# Patient Record
Sex: Female | Born: 1985 | Race: Black or African American | Hispanic: No | Marital: Single | Smoking: Never smoker
Health system: Southern US, Community
[De-identification: ages and names within clinical notes are randomized; demographics above are authoritative.]

## PROBLEM LIST (undated history)

## (undated) ENCOUNTER — Inpatient Hospital Stay (HOSPITAL_COMMUNITY): Payer: Self-pay

## (undated) DIAGNOSIS — G35 Multiple sclerosis: Secondary | ICD-10-CM

## (undated) DIAGNOSIS — H539 Unspecified visual disturbance: Secondary | ICD-10-CM

## (undated) DIAGNOSIS — R87619 Unspecified abnormal cytological findings in specimens from cervix uteri: Secondary | ICD-10-CM

## (undated) DIAGNOSIS — B009 Herpesviral infection, unspecified: Secondary | ICD-10-CM

## (undated) DIAGNOSIS — IMO0002 Reserved for concepts with insufficient information to code with codable children: Secondary | ICD-10-CM

## (undated) DIAGNOSIS — Z8619 Personal history of other infectious and parasitic diseases: Secondary | ICD-10-CM

## (undated) DIAGNOSIS — I10 Essential (primary) hypertension: Secondary | ICD-10-CM

## (undated) HISTORY — PX: WISDOM TOOTH EXTRACTION: SHX21

## (undated) HISTORY — PX: COLPOSCOPY: SHX161

## (undated) HISTORY — DX: Unspecified visual disturbance: H53.9

## (undated) HISTORY — DX: Personal history of other infectious and parasitic diseases: Z86.19

## (undated) HISTORY — DX: Multiple sclerosis: G35

## (undated) HISTORY — DX: Essential (primary) hypertension: I10

---

## 2004-02-04 ENCOUNTER — Emergency Department (HOSPITAL_COMMUNITY): Admission: EM | Admit: 2004-02-04 | Discharge: 2004-02-04 | Payer: Self-pay

## 2005-01-06 ENCOUNTER — Emergency Department (HOSPITAL_COMMUNITY): Admission: EM | Admit: 2005-01-06 | Discharge: 2005-01-06 | Payer: Self-pay | Admitting: Emergency Medicine

## 2008-03-16 ENCOUNTER — Emergency Department (HOSPITAL_COMMUNITY): Admission: EM | Admit: 2008-03-16 | Discharge: 2008-03-16 | Payer: Self-pay | Admitting: Family Medicine

## 2008-10-13 ENCOUNTER — Ambulatory Visit: Payer: Self-pay | Admitting: Advanced Practice Midwife

## 2008-10-13 ENCOUNTER — Inpatient Hospital Stay (HOSPITAL_COMMUNITY): Admission: AD | Admit: 2008-10-13 | Discharge: 2008-10-14 | Payer: Self-pay | Admitting: Obstetrics and Gynecology

## 2008-11-28 ENCOUNTER — Inpatient Hospital Stay (HOSPITAL_COMMUNITY): Admission: AD | Admit: 2008-11-28 | Discharge: 2008-11-28 | Payer: Self-pay | Admitting: Obstetrics and Gynecology

## 2008-12-28 ENCOUNTER — Inpatient Hospital Stay (HOSPITAL_COMMUNITY): Admission: AD | Admit: 2008-12-28 | Discharge: 2008-12-29 | Payer: Self-pay | Admitting: Obstetrics and Gynecology

## 2008-12-31 ENCOUNTER — Inpatient Hospital Stay (HOSPITAL_COMMUNITY): Admission: AD | Admit: 2008-12-31 | Discharge: 2008-12-31 | Payer: Self-pay | Admitting: Obstetrics and Gynecology

## 2009-01-05 ENCOUNTER — Inpatient Hospital Stay (HOSPITAL_COMMUNITY): Admission: AD | Admit: 2009-01-05 | Discharge: 2009-01-05 | Payer: Self-pay | Admitting: Obstetrics and Gynecology

## 2009-01-16 ENCOUNTER — Inpatient Hospital Stay (HOSPITAL_COMMUNITY): Admission: AD | Admit: 2009-01-16 | Discharge: 2009-01-17 | Payer: Self-pay | Admitting: Obstetrics and Gynecology

## 2009-05-18 ENCOUNTER — Other Ambulatory Visit: Admission: RE | Admit: 2009-05-18 | Discharge: 2009-05-18 | Payer: Self-pay | Admitting: Obstetrics and Gynecology

## 2009-06-10 ENCOUNTER — Emergency Department (HOSPITAL_COMMUNITY): Admission: EM | Admit: 2009-06-10 | Discharge: 2009-06-10 | Payer: Self-pay | Admitting: Family Medicine

## 2010-08-13 ENCOUNTER — Other Ambulatory Visit: Payer: Self-pay | Admitting: Obstetrics and Gynecology

## 2010-08-13 ENCOUNTER — Other Ambulatory Visit (HOSPITAL_COMMUNITY)
Admission: RE | Admit: 2010-08-13 | Discharge: 2010-08-13 | Disposition: A | Discharge status: Home / Self Care | Payer: BC Managed Care – PPO | Source: Ambulatory Visit | Attending: Obstetrics and Gynecology | Admitting: Obstetrics and Gynecology

## 2010-08-13 DIAGNOSIS — Z113 Encounter for screening for infections with a predominantly sexual mode of transmission: Secondary | ICD-10-CM | POA: Insufficient documentation

## 2010-08-13 DIAGNOSIS — Z01419 Encounter for gynecological examination (general) (routine) without abnormal findings: Secondary | ICD-10-CM | POA: Insufficient documentation

## 2010-08-25 LAB — CBC
HCT: 33.1 % — ABNORMAL LOW (ref 36.0–46.0)
MCHC: 33.1 g/dL (ref 30.0–36.0)
MCHC: 33.1 g/dL (ref 30.0–36.0)
Platelets: 155 10*3/uL (ref 150–400)
Platelets: 167 10*3/uL (ref 150–400)
RBC: 3.92 MIL/uL (ref 3.87–5.11)
RDW: 14.6 % (ref 11.5–15.5)

## 2010-08-26 LAB — URINALYSIS, ROUTINE W REFLEX MICROSCOPIC
Glucose, UA: NEGATIVE mg/dL
Leukocytes, UA: NEGATIVE
Nitrite: NEGATIVE
Protein, ur: NEGATIVE mg/dL
Urobilinogen, UA: 1 mg/dL (ref 0.0–1.0)

## 2010-08-26 LAB — URINE MICROSCOPIC-ADD ON

## 2010-08-28 LAB — URINALYSIS, ROUTINE W REFLEX MICROSCOPIC
Bilirubin Urine: NEGATIVE
Hgb urine dipstick: NEGATIVE
Protein, ur: 30 mg/dL — AB
Specific Gravity, Urine: >1.03 — ABNORMAL HIGH (ref 1.005–1.030)
Urobilinogen, UA: 1 mg/dL (ref 0.0–1.0)

## 2010-08-28 LAB — URINE MICROSCOPIC-ADD ON

## 2010-08-28 LAB — FETAL FIBRONECTIN: Fetal Fibronectin: NEGATIVE

## 2010-08-28 LAB — WET PREP, GENITAL: Clue Cells Wet Prep HPF POC: NONE SEEN

## 2011-02-19 ENCOUNTER — Ambulatory Visit (INDEPENDENT_AMBULATORY_CARE_PROVIDER_SITE_OTHER): Payer: BC Managed Care – PPO | Admitting: Urology

## 2011-02-19 DIAGNOSIS — N3941 Urge incontinence: Secondary | ICD-10-CM

## 2011-02-19 DIAGNOSIS — R3915 Urgency of urination: Secondary | ICD-10-CM

## 2011-02-19 DIAGNOSIS — R350 Frequency of micturition: Secondary | ICD-10-CM

## 2011-04-02 ENCOUNTER — Other Ambulatory Visit: Payer: Self-pay | Admitting: Obstetrics and Gynecology

## 2011-04-02 DIAGNOSIS — N644 Mastodynia: Secondary | ICD-10-CM

## 2011-04-02 DIAGNOSIS — N6322 Unspecified lump in the left breast, upper inner quadrant: Secondary | ICD-10-CM

## 2011-04-04 ENCOUNTER — Ambulatory Visit
Admission: RE | Admit: 2011-04-04 | Discharge: 2011-04-04 | Disposition: A | Discharge status: Home / Self Care | Payer: BC Managed Care – PPO | Source: Ambulatory Visit | Attending: Obstetrics and Gynecology | Admitting: Obstetrics and Gynecology

## 2011-04-04 DIAGNOSIS — N6322 Unspecified lump in the left breast, upper inner quadrant: Secondary | ICD-10-CM

## 2011-04-04 DIAGNOSIS — N644 Mastodynia: Secondary | ICD-10-CM

## 2011-07-30 ENCOUNTER — Other Ambulatory Visit (HOSPITAL_COMMUNITY)
Admission: RE | Admit: 2011-07-30 | Discharge: 2011-07-30 | Disposition: A | Discharge status: Home / Self Care | Payer: BC Managed Care – PPO | Source: Ambulatory Visit | Attending: Obstetrics and Gynecology | Admitting: Obstetrics and Gynecology

## 2011-07-30 ENCOUNTER — Other Ambulatory Visit: Payer: Self-pay | Admitting: Obstetrics and Gynecology

## 2011-07-30 DIAGNOSIS — R87810 Cervical high risk human papillomavirus (HPV) DNA test positive: Secondary | ICD-10-CM | POA: Insufficient documentation

## 2011-07-30 DIAGNOSIS — Z01419 Encounter for gynecological examination (general) (routine) without abnormal findings: Secondary | ICD-10-CM | POA: Insufficient documentation

## 2011-09-02 ENCOUNTER — Other Ambulatory Visit: Payer: Self-pay | Admitting: Obstetrics and Gynecology

## 2012-03-03 ENCOUNTER — Other Ambulatory Visit: Payer: Self-pay | Admitting: Obstetrics and Gynecology

## 2012-03-03 ENCOUNTER — Other Ambulatory Visit (HOSPITAL_COMMUNITY)
Admission: RE | Admit: 2012-03-03 | Discharge: 2012-03-03 | Disposition: A | Discharge status: Home / Self Care | Payer: Managed Care, Other (non HMO) | Source: Ambulatory Visit | Attending: Obstetrics and Gynecology | Admitting: Obstetrics and Gynecology

## 2012-03-03 DIAGNOSIS — Z113 Encounter for screening for infections with a predominantly sexual mode of transmission: Secondary | ICD-10-CM | POA: Insufficient documentation

## 2012-03-03 DIAGNOSIS — Z1151 Encounter for screening for human papillomavirus (HPV): Secondary | ICD-10-CM | POA: Insufficient documentation

## 2012-03-03 DIAGNOSIS — Z01419 Encounter for gynecological examination (general) (routine) without abnormal findings: Secondary | ICD-10-CM | POA: Insufficient documentation

## 2012-05-20 NOTE — L&D Delivery Note (Signed)
Delivery Note At 3:04 PM a viable female was delivered via Vaginal, Spontaneous Delivery (Presentation: occoput anterior  ;  ).  APGAR: 9, 9; weight .   Placenta status: Intact, Spontaneous.  Cord: 3 vessels with the following complications: None.  Cord pH: na  Anesthesia: Epidural  Episiotomy: None Lacerations: None Suture Repair: na Est. Blood Loss (mL): 300  Mom to postpartum.  Baby to Couplet care / Skin to Skin.  Tierrah Anastos J. 04/01/2013, 3:50 PM

## 2012-09-11 LAB — OB RESULTS CONSOLE HEPATITIS B SURFACE ANTIGEN: Hepatitis B Surface Ag: NEGATIVE

## 2012-09-11 LAB — OB RESULTS CONSOLE HIV ANTIBODY (ROUTINE TESTING): HIV: NONREACTIVE

## 2012-09-11 LAB — OB RESULTS CONSOLE ABO/RH: RH Type: POSITIVE

## 2012-09-17 ENCOUNTER — Other Ambulatory Visit (HOSPITAL_COMMUNITY)
Admission: RE | Admit: 2012-09-17 | Discharge: 2012-09-17 | Disposition: A | Discharge status: Home / Self Care | Payer: 59 | Source: Ambulatory Visit | Attending: Obstetrics and Gynecology | Admitting: Obstetrics and Gynecology

## 2012-09-17 ENCOUNTER — Other Ambulatory Visit: Payer: Self-pay | Admitting: Obstetrics and Gynecology

## 2012-09-17 DIAGNOSIS — Z01419 Encounter for gynecological examination (general) (routine) without abnormal findings: Secondary | ICD-10-CM | POA: Insufficient documentation

## 2012-09-17 DIAGNOSIS — Z113 Encounter for screening for infections with a predominantly sexual mode of transmission: Secondary | ICD-10-CM | POA: Insufficient documentation

## 2012-09-17 DIAGNOSIS — R87810 Cervical high risk human papillomavirus (HPV) DNA test positive: Secondary | ICD-10-CM | POA: Insufficient documentation

## 2012-09-17 DIAGNOSIS — Z1151 Encounter for screening for human papillomavirus (HPV): Secondary | ICD-10-CM | POA: Insufficient documentation

## 2013-01-20 LAB — OB RESULTS CONSOLE GC/CHLAMYDIA: Chlamydia: NEGATIVE

## 2013-03-15 ENCOUNTER — Encounter (HOSPITAL_COMMUNITY): Payer: Self-pay | Admitting: *Deleted

## 2013-03-15 ENCOUNTER — Inpatient Hospital Stay (HOSPITAL_COMMUNITY)
Admission: AD | Admit: 2013-03-15 | Discharge: 2013-03-15 | Disposition: A | Discharge status: Home / Self Care | Payer: 59 | Source: Ambulatory Visit | Attending: Obstetrics and Gynecology | Admitting: Obstetrics and Gynecology

## 2013-03-15 DIAGNOSIS — O479 False labor, unspecified: Secondary | ICD-10-CM | POA: Insufficient documentation

## 2013-03-15 HISTORY — DX: Unspecified abnormal cytological findings in specimens from cervix uteri: R87.619

## 2013-03-15 HISTORY — DX: Herpesviral infection, unspecified: B00.9

## 2013-03-15 HISTORY — DX: Reserved for concepts with insufficient information to code with codable children: IMO0002

## 2013-03-15 MED ORDER — OXYCODONE-ACETAMINOPHEN 5-325 MG PO TABS
2.0000 | ORAL_TABLET | Freq: Once | ORAL | Status: AC
  Administered 2013-03-15: 2 via ORAL
  Filled 2013-03-15: qty 2

## 2013-03-15 MED ORDER — LACTATED RINGERS IV SOLN
INTRAVENOUS | Status: DC
Start: 2013-03-15 — End: 2013-03-15

## 2013-03-15 MED ORDER — LACTATED RINGERS IV SOLN
Freq: Once | INTRAVENOUS | Status: AC
  Administered 2013-03-15: 10:00:00 via INTRAVENOUS

## 2013-03-15 NOTE — MAU Note (Signed)
UC's since 0530. States was 2cm on last exam. No leaking or bleeding.

## 2013-03-15 NOTE — MAU Note (Signed)
Patient states she is having contractions every 5 minutes. Denies bleeding or leaking and reports good fetal movement.  

## 2013-03-24 ENCOUNTER — Encounter (HOSPITAL_COMMUNITY): Payer: Self-pay | Admitting: *Deleted

## 2013-03-24 ENCOUNTER — Inpatient Hospital Stay (HOSPITAL_COMMUNITY)
Admission: AD | Admit: 2013-03-24 | Discharge: 2013-03-24 | Disposition: A | Discharge status: Home / Self Care | Payer: 59 | Source: Ambulatory Visit | Attending: Obstetrics and Gynecology | Admitting: Obstetrics and Gynecology

## 2013-03-24 DIAGNOSIS — O479 False labor, unspecified: Secondary | ICD-10-CM | POA: Insufficient documentation

## 2013-03-24 NOTE — MAU Note (Signed)
Has been contracting, but around noon today, they seemed to get stronger, maybe closer.  Has a lot of pressure. No bleeding, no leaking.  Was 2/75 yesterday in office.

## 2013-03-24 NOTE — Discharge Instructions (Signed)
Keep your scheduled appointment for prenatal care. Call the office or provider on call with further concerns or return to MAU as needed. °

## 2013-03-28 ENCOUNTER — Encounter (HOSPITAL_COMMUNITY): Payer: Self-pay | Admitting: *Deleted

## 2013-03-28 ENCOUNTER — Inpatient Hospital Stay (HOSPITAL_COMMUNITY)
Admission: AD | Admit: 2013-03-28 | Discharge: 2013-03-28 | Disposition: A | Discharge status: Home / Self Care | Payer: 59 | Source: Ambulatory Visit | Attending: Obstetrics and Gynecology | Admitting: Obstetrics and Gynecology

## 2013-03-28 DIAGNOSIS — O479 False labor, unspecified: Secondary | ICD-10-CM | POA: Insufficient documentation

## 2013-03-28 MED ORDER — ACETAMINOPHEN 325 MG PO TABS
650.0000 mg | ORAL_TABLET | Freq: Once | ORAL | Status: AC
  Administered 2013-03-28: 650 mg via ORAL
  Filled 2013-03-28: qty 2

## 2013-03-28 NOTE — MAU Note (Signed)
[facility] did not engage as asked on arrival; request resubmitted and tracing.

## 2013-03-28 NOTE — MAU Note (Signed)
Patient presents with complaint of regular contractions every 5 minutes since 0900 today.

## 2013-03-28 NOTE — MAU Note (Signed)
Asked to call Ambien 10mg  1 po qhs prn x 5 tablets to pharmacy of choice by Dr. Richardson Dopp.

## 2013-03-29 ENCOUNTER — Encounter (HOSPITAL_COMMUNITY): Payer: Self-pay | Admitting: *Deleted

## 2013-03-29 ENCOUNTER — Telehealth (HOSPITAL_COMMUNITY): Payer: Self-pay | Admitting: *Deleted

## 2013-03-29 NOTE — Telephone Encounter (Signed)
Preadmission screen  

## 2013-04-01 ENCOUNTER — Inpatient Hospital Stay (HOSPITAL_COMMUNITY)
Admission: RE | Admit: 2013-04-01 | Discharge: 2013-04-03 | DRG: 774 | Disposition: A | Discharge status: Home / Self Care | Payer: 59 | Source: Ambulatory Visit | Attending: Obstetrics and Gynecology | Admitting: Obstetrics and Gynecology

## 2013-04-01 ENCOUNTER — Inpatient Hospital Stay (HOSPITAL_COMMUNITY): Payer: 59 | Admitting: Anesthesiology

## 2013-04-01 ENCOUNTER — Encounter (HOSPITAL_COMMUNITY): Payer: 59 | Admitting: Anesthesiology

## 2013-04-01 ENCOUNTER — Encounter (HOSPITAL_COMMUNITY): Payer: Self-pay

## 2013-04-01 DIAGNOSIS — O2650 Maternal hypotension syndrome, unspecified trimester: Secondary | ICD-10-CM | POA: Diagnosis not present

## 2013-04-01 LAB — CBC
HCT: 27.8 % — ABNORMAL LOW (ref 36.0–46.0)
Hemoglobin: 9.4 g/dL — ABNORMAL LOW (ref 12.0–15.0)
MCH: 25.9 pg — ABNORMAL LOW (ref 26.0–34.0)
MCH: 26 pg (ref 26.0–34.0)
MCHC: 33.6 g/dL (ref 30.0–36.0)
MCHC: 33.8 g/dL (ref 30.0–36.0)
MCV: 76.9 fL — ABNORMAL LOW (ref 78.0–100.0)
Platelets: 186 10*3/uL (ref 150–400)
Platelets: 163 10*3/uL (ref 150–400)
RBC: 4.25 MIL/uL (ref 3.87–5.11)
RBC: 3.62 MIL/uL — ABNORMAL LOW (ref 3.87–5.11)
RDW: 14.5 % (ref 11.5–15.5)
RDW: 14 % (ref 11.5–15.5)
WBC: 17.7 10*3/uL — ABNORMAL HIGH (ref 4.0–10.5)

## 2013-04-01 LAB — POSTPARTUM HEMORRHAGE PROTOCOL (BB NOTIFICATION)

## 2013-04-01 LAB — RPR: RPR Ser Ql: NONREACTIVE

## 2013-04-01 LAB — PREPARE RBC (CROSSMATCH)

## 2013-04-01 MED ORDER — ONDANSETRON HCL 4 MG/2ML IJ SOLN: 4.0000 mg | Freq: Four times a day (QID) | INTRAMUSCULAR | Status: DC | PRN

## 2013-04-01 MED ORDER — OXYTOCIN BOLUS FROM INFUSION
500.0000 mL | INTRAVENOUS | Status: DC
  Administered 2013-04-01 (×2): 500 mL via INTRAVENOUS

## 2013-04-01 MED ORDER — TERBUTALINE SULFATE 1 MG/ML IJ SOLN: 0.2500 mg | Freq: Once | INTRAMUSCULAR | Status: DC | PRN

## 2013-04-01 MED ORDER — ONDANSETRON HCL 4 MG PO TABS: 4.0000 mg | ORAL_TABLET | ORAL | Status: DC | PRN

## 2013-04-01 MED ORDER — FLEET ENEMA 7-19 GM/118ML RE ENEM: 1.0000 | ENEMA | RECTAL | Status: DC | PRN

## 2013-04-01 MED ORDER — IBUPROFEN 600 MG PO TABS
600.0000 mg | ORAL_TABLET | Freq: Four times a day (QID) | ORAL | Status: DC | PRN
  Administered 2013-04-01: 600 mg via ORAL
  Filled 2013-04-01: qty 1

## 2013-04-01 MED ORDER — SENNOSIDES-DOCUSATE SODIUM 8.6-50 MG PO TABS
2.0000 | ORAL_TABLET | ORAL | Status: DC
  Administered 2013-04-01 – 2013-04-02 (×2): 2 via ORAL
  Filled 2013-04-01 (×2): qty 2

## 2013-04-01 MED ORDER — DIBUCAINE 1 % RE OINT: 1.0000 "application " | TOPICAL_OINTMENT | RECTAL | Status: DC | PRN

## 2013-04-01 MED ORDER — METHYLERGONOVINE MALEATE 0.2 MG/ML IJ SOLN
0.2000 mg | Freq: Once | INTRAMUSCULAR | Status: AC
  Administered 2013-04-01: 0.2 mg via INTRAMUSCULAR

## 2013-04-01 MED ORDER — LANOLIN HYDROUS EX OINT: TOPICAL_OINTMENT | CUTANEOUS | Status: DC | PRN

## 2013-04-01 MED ORDER — METHYLERGONOVINE MALEATE 0.2 MG/ML IJ SOLN: 0.2000 mg | INTRAMUSCULAR | Status: DC | PRN

## 2013-04-01 MED ORDER — OXYCODONE-ACETAMINOPHEN 5-325 MG PO TABS: 1.0000 | ORAL_TABLET | ORAL | Status: DC | PRN

## 2013-04-01 MED ORDER — OXYCODONE-ACETAMINOPHEN 5-325 MG PO TABS
1.0000 | ORAL_TABLET | ORAL | Status: DC | PRN
  Administered 2013-04-01 – 2013-04-02 (×3): 1 via ORAL
  Administered 2013-04-02: 2 via ORAL
  Administered 2013-04-02: 1 via ORAL
  Administered 2013-04-02: 2 via ORAL
  Administered 2013-04-03 (×3): 1 via ORAL
  Filled 2013-04-01 (×7): qty 1
  Filled 2013-04-01: qty 2
  Filled 2013-04-01 (×2): qty 1

## 2013-04-01 MED ORDER — DIPHENHYDRAMINE HCL 50 MG/ML IJ SOLN: 12.5000 mg | INTRAMUSCULAR | Status: DC | PRN

## 2013-04-01 MED ORDER — EPHEDRINE 5 MG/ML INJ
10.0000 mg | INTRAVENOUS | Status: DC | PRN
  Filled 2013-04-01: qty 2

## 2013-04-01 MED ORDER — OXYTOCIN 40 UNITS IN LACTATED RINGERS INFUSION - SIMPLE MED
1.0000 m[IU]/min | INTRAVENOUS | Status: DC
  Administered 2013-04-01: 2 m[IU]/min via INTRAVENOUS
  Filled 2013-04-01: qty 1000

## 2013-04-01 MED ORDER — FERROUS SULFATE 325 (65 FE) MG PO TABS
325.0000 mg | ORAL_TABLET | Freq: Two times a day (BID) | ORAL | Status: DC
  Administered 2013-04-02 – 2013-04-03 (×4): 325 mg via ORAL
  Filled 2013-04-01 (×4): qty 1

## 2013-04-01 MED ORDER — LIDOCAINE HCL (PF) 1 % IJ SOLN
30.0000 mL | INTRAMUSCULAR | Status: DC | PRN
  Filled 2013-04-01 (×2): qty 30

## 2013-04-01 MED ORDER — CITRIC ACID-SODIUM CITRATE 334-500 MG/5ML PO SOLN: 30.0000 mL | ORAL | Status: DC | PRN

## 2013-04-01 MED ORDER — WITCH HAZEL-GLYCERIN EX PADS: 1.0000 "application " | MEDICATED_PAD | CUTANEOUS | Status: DC | PRN

## 2013-04-01 MED ORDER — FENTANYL 2.5 MCG/ML BUPIVACAINE 1/10 % EPIDURAL INFUSION (WH - ANES)
14.0000 mL/h | INTRAMUSCULAR | Status: DC | PRN
  Administered 2013-04-01: 14 mL/h via EPIDURAL
  Filled 2013-04-01: qty 125

## 2013-04-01 MED ORDER — METHYLERGONOVINE MALEATE 0.2 MG PO TABS
0.2000 mg | ORAL_TABLET | ORAL | Status: DC | PRN
  Administered 2013-04-01 – 2013-04-02 (×2): 0.2 mg via ORAL
  Filled 2013-04-01 (×2): qty 1

## 2013-04-01 MED ORDER — IBUPROFEN 600 MG PO TABS
600.0000 mg | ORAL_TABLET | Freq: Four times a day (QID) | ORAL | Status: DC
  Administered 2013-04-01 – 2013-04-03 (×8): 600 mg via ORAL
  Filled 2013-04-01 (×9): qty 1

## 2013-04-01 MED ORDER — ZOLPIDEM TARTRATE 5 MG PO TABS: 5.0000 mg | ORAL_TABLET | Freq: Every evening | ORAL | Status: DC | PRN

## 2013-04-01 MED ORDER — PHENYLEPHRINE 40 MCG/ML (10ML) SYRINGE FOR IV PUSH (FOR BLOOD PRESSURE SUPPORT)
80.0000 ug | PREFILLED_SYRINGE | INTRAVENOUS | Status: DC | PRN
  Filled 2013-04-01: qty 2

## 2013-04-01 MED ORDER — ONDANSETRON HCL 4 MG/2ML IJ SOLN: 4.0000 mg | INTRAMUSCULAR | Status: DC | PRN

## 2013-04-01 MED ORDER — EPHEDRINE 5 MG/ML INJ
10.0000 mg | INTRAVENOUS | Status: DC | PRN
  Filled 2013-04-01: qty 2
  Filled 2013-04-01: qty 4

## 2013-04-01 MED ORDER — OXYTOCIN 40 UNITS IN LACTATED RINGERS INFUSION - SIMPLE MED
INTRAVENOUS | Status: AC
  Filled 2013-04-01: qty 1000

## 2013-04-01 MED ORDER — PRENATAL MULTIVITAMIN CH
1.0000 | ORAL_TABLET | Freq: Every day | ORAL | Status: DC
  Administered 2013-04-02 – 2013-04-03 (×2): 1 via ORAL
  Filled 2013-04-01 (×2): qty 1

## 2013-04-01 MED ORDER — ACETAMINOPHEN 325 MG PO TABS: 650.0000 mg | ORAL_TABLET | ORAL | Status: DC | PRN

## 2013-04-01 MED ORDER — LACTATED RINGERS IV SOLN
500.0000 mL | INTRAVENOUS | Status: DC | PRN
  Administered 2013-04-01: 1000 mL via INTRAVENOUS

## 2013-04-01 MED ORDER — DIPHENHYDRAMINE HCL 25 MG PO CAPS: 25.0000 mg | ORAL_CAPSULE | Freq: Four times a day (QID) | ORAL | Status: DC | PRN

## 2013-04-01 MED ORDER — SIMETHICONE 80 MG PO CHEW: 80.0000 mg | CHEWABLE_TABLET | ORAL | Status: DC | PRN

## 2013-04-01 MED ORDER — SODIUM BICARBONATE 8.4 % IV SOLN
INTRAVENOUS | Status: DC | PRN
  Administered 2013-04-01: 5 mL via EPIDURAL

## 2013-04-01 MED ORDER — HYDROMORPHONE HCL PF 1 MG/ML IJ SOLN
INTRAMUSCULAR | Status: AC
  Administered 2013-04-01: 1 mg via INTRAMUSCULAR
  Filled 2013-04-01: qty 1

## 2013-04-01 MED ORDER — OXYTOCIN 40 UNITS IN LACTATED RINGERS INFUSION - SIMPLE MED: 62.5000 mL/h | INTRAVENOUS | Status: DC

## 2013-04-01 MED ORDER — LACTATED RINGERS IV SOLN: 500.0000 mL | Freq: Once | INTRAVENOUS | Status: DC

## 2013-04-01 MED ORDER — LACTATED RINGERS IV SOLN: INTRAVENOUS | Status: DC

## 2013-04-01 MED ORDER — MISOPROSTOL 200 MCG PO TABS
1000.0000 ug | ORAL_TABLET | Freq: Once | ORAL | Status: AC
  Administered 2013-04-01: 1000 ug via RECTAL

## 2013-04-01 MED ORDER — MISOPROSTOL 200 MCG PO TABS
ORAL_TABLET | ORAL | Status: AC
  Administered 2013-04-01: 1000 ug via RECTAL
  Filled 2013-04-01: qty 5

## 2013-04-01 MED ORDER — LACTATED RINGERS IV BOLUS (SEPSIS)
500.0000 mL | Freq: Once | INTRAVENOUS | Status: AC
  Administered 2013-04-01: 50 mL via INTRAVENOUS

## 2013-04-01 MED ORDER — BENZOCAINE-MENTHOL 20-0.5 % EX AERO: 1.0000 "application " | INHALATION_SPRAY | CUTANEOUS | Status: DC | PRN

## 2013-04-01 MED ORDER — PHENYLEPHRINE 40 MCG/ML (10ML) SYRINGE FOR IV PUSH (FOR BLOOD PRESSURE SUPPORT)
80.0000 ug | PREFILLED_SYRINGE | INTRAVENOUS | Status: DC | PRN
  Filled 2013-04-01: qty 10
  Filled 2013-04-01: qty 2

## 2013-04-01 NOTE — Progress Notes (Signed)
Post Partum Day 0 s/p svd  Subjective: Called by nurse to see patient due to syncopal episode and postpartum hemorrhage. According to the nurse patient had of blood loss prior to my arrival Per my instructions she received  methergine 0.2 mg IM and 1000 mcg of cytotec per rectum..  Objective: Blood pressure 134/84, pulse 84, temperature 98.7 F (37.1 C), temperature source Oral, resp. rate 22, height 5\' 4"  (1.626 m), weight 77.111 kg (170 lb), SpO2 100.00%, unknown if currently breastfeeding.  Physical Exam:  General: cooperative, fatigued and flushed Lochia: with fundal massage of blood was expressed from the uterus...  Pelvic exam no vaginal or cervical lacerations were present.  Uterine Fundus: soft Incision: NA DVT Evaluation: No evidence of DVT seen on physical exam.   Recent Labs  04/01/13 0800 04/01/13 1842  HGB 11.0* 9.4*  HCT 32.7* 27.8*    Assessment/Plan: PPD# 0 with postpartum hemorrhage CUrrently stable   Bleeding subsided with pitocin and fundal message.   Continue methergine 2 mg q 4 hours  6 doses Second iv site placed  Pt advised if hemorrhage restarts may require D&C possible endometrial balloon placement.  R/b/a of D&C discussed including but not limited to infection/ bleeding/ perforation of uterus.. Continued bleeding with possible need for hysterectomy.  R/o transfusion hiv/ hep B&C discussed.  Pt voiced understanding   No discharge until 04/03/2013 due to postpartum hemorrhage.  Dr. Stefano Gaul covering 04/01/2013 throug 04/02/2013 until 7 am Dr. Charlotta Newton covering 04/02/2013 starting at 7 am   LOS: 0 days   Zimir Kittleson J. 04/01/2013, 7:11 PM

## 2013-04-01 NOTE — Progress Notes (Signed)
Rapid Response called, patient passed out and had some minor jerking movement per RN. Back in bed at time Rapid Response RN arrived to room. On non-rebreather. Patient with continued vaginal bleeding, some clots expressed with fundal massage, PPH stage 1 initiated, Dr.Cole notified. Orders entered and Methergine and Cytotec given as well as fluid bolus IV. Dr.Cole arrived, more clots expressed and speculum exam done, Pitocin bolus started and additional IV started and labs drawn. Patient given Dilaudid for pain. RROB RN  And patient's primary RN at bedside. Patient stable. Family at bedside.

## 2013-04-01 NOTE — Anesthesia Preprocedure Evaluation (Signed)
Anesthesia Evaluation  Patient identified by MRN, date of birth, ID band Patient awake    Reviewed: Allergy & Precautions, H&P , Patient's Chart, lab work & pertinent test results  Airway Mallampati: II TM Distance: >3 FB Neck ROM: Full    Dental no notable dental hx. (+) Teeth Intact   Pulmonary neg pulmonary ROS,  breath sounds clear to auscultation  Pulmonary exam normal       Cardiovascular negative cardio ROS  Rhythm:Regular Rate:Normal     Neuro/Psych negative neurological ROS  negative psych ROS   GI/Hepatic negative GI ROS, Neg liver ROS,   Endo/Other  negative endocrine ROS  Renal/GU negative Renal ROS  negative genitourinary   Musculoskeletal negative musculoskeletal ROS (+)   Abdominal (+) - obese,   Peds  Hematology  (+) anemia ,   Anesthesia Other Findings   Reproductive/Obstetrics (+) Pregnancy HSV                           Anesthesia Physical Anesthesia Plan  ASA: II  Anesthesia Plan: Epidural   Post-op Pain Management:    Induction:   Airway Management Planned: Natural Airway  Additional Equipment:   Intra-op Plan:   Post-operative Plan:   Informed Consent: I have reviewed the patients History and Physical, chart, labs and discussed the procedure including the risks, benefits and alternatives for the proposed anesthesia with the patient or authorized representative who has indicated his/her understanding and acceptance.     Plan Discussed with: Anesthesiologist  Anesthesia Plan Comments:         Anesthesia Quick Evaluation

## 2013-04-01 NOTE — H&P (Signed)
Amanda Horne is a 27 y.o. female presenting for for elective induction at 39 wks and 2 days. +fm no lof no vaginal bleeding irregular contractions.     History OB History   Grav Para Term Preterm Abortions TAB SAB Ect Mult Living   3 2 2       2      Past Medical History  Diagnosis Date  . Abnormal Pap smear   . Herpes   . Hx of varicella    Past Surgical History  Procedure Laterality Date  . Colposcopy    . Wisdom tooth extraction    . No past surgeries     Family History: family history includes Diabetes in her mother and sister; Hypertension in her mother; Other in her paternal uncle. Social History:  reports that she has never smoked. She has never used smokeless tobacco. She reports that she does not drink alcohol or use illicit drugs.   Prenatal Transfer Tool  Maternal Diabetes: No Genetic Screening: Normal Maternal Ultrasounds/Referrals: Normal Fetal Ultrasounds or other Referrals:  None Maternal Substance Abuse:  No Significant Maternal Medications:  Meds include: Other: valtrex  Significant Maternal Lab Results:  None Other Comments:  None  Review of Systems  All other systems reviewed and are negative.    Dilation: 4.5 Effacement (%): 70 Station: -1 Exam by:: Selassie Spatafore Blood pressure 118/72, pulse 86, temperature 98.5 F (36.9 C), temperature source Axillary, resp. rate 18, height 5\' 4"  (1.626 m), weight 77.111 kg (170 lb), SpO2 100.00%. Maternal Exam:  Uterine Assessment: Contraction strength is moderate.  Abdomen: Patient reports no abdominal tenderness. Introitus: Normal vulva. Normal vagina.    Fetal Exam Fetal Monitor Review: Baseline rate: 120.  Variability: moderate (6-25 bpm).    Fetal State Assessment: Category I - tracings are normal.     Physical Exam  Constitutional: She is oriented to person, place, and time. She appears well-developed and well-nourished.  Neck: Normal range of motion.  Cardiovascular: Normal rate and regular rhythm.    Respiratory: Effort normal.  Genitourinary: Vagina normal and uterus normal.  Musculoskeletal: Normal range of motion. She exhibits no edema.  Neurological: She is alert and oriented to person, place, and time.  Skin: Skin is warm and dry.  Psychiatric: She has a normal mood and affect.   cervix 4.5/75/-1 Arom clear fluid  Prenatal labs: ABO, Rh: A/Positive/-- (04/25 0000) Antibody: Negative (04/25 0000) Rubella:  Immune  RPR: Nonreactive (04/25 0000)  HBsAg: Negative (04/25 0000)  HIV: Non-reactive (04/25 0000)  GBS: Negative (10/20 0000)   Assessment/Plan: 39 wks and 2 days for elective induction...  Pt has been informed of increased r/o cesarean section  Associated with induction and accepts this risk.  Continue pitocin   epidural for pain  Anticipate svd.   Ishmael Berkovich J. 04/01/2013, 1:20 PM

## 2013-04-01 NOTE — Anesthesia Procedure Notes (Signed)

## 2013-04-02 LAB — CBC
HCT: 21.2 % — ABNORMAL LOW (ref 36.0–46.0)
Hemoglobin: 7.2 g/dL — ABNORMAL LOW (ref 12.0–15.0)
MCH: 26 pg (ref 26.0–34.0)
MCV: 76.5 fL — ABNORMAL LOW (ref 78.0–100.0)
RBC: 2.77 MIL/uL — ABNORMAL LOW (ref 3.87–5.11)
WBC: 11.2 10*3/uL — ABNORMAL HIGH (ref 4.0–10.5)

## 2013-04-02 MED ORDER — HYDROMORPHONE HCL PF 1 MG/ML IJ SOLN
1.0000 mg | Freq: Once | INTRAMUSCULAR | Status: AC
  Administered 2013-04-01: 1 mg via INTRAMUSCULAR

## 2013-04-02 NOTE — Lactation Note (Signed)
This note was copied from the chart of Amanda Ron Sayas. Lactation Consultation Note   Follow up consult with this mom and baby, now 28 hours post partum. Mom hemorrhaged yesterday, but is feeling much better tonight. Her baby appears to be cluster feeding.I observed mom latching, and had mom sit back and bring the baby to her. He latches deeply with good suckles, but only for about 5 minutes at a time, Once unlatched, he begins cuing again. I showed mom how to hand express to supplement the baby. Mom was able to express about 0.5 mls of colostrum, which he spoon fed well. Mom does not want to begin pumping to supplement at the time, so I advised her to add hand expression about once an hour, with spoon feeding. I also told mom to continue with formula supplement, if baby is not having normal amounts of wet and dirty diapers, or continues to appear hungry despite breast feeding.Pecola Leisure and Me book breastfeeding pages reviewed with mom.  Mom knows to call for questions/concerns.  Patient Name: Amanda Horne [id number] Date: 04/02/2013 Reason for consult: Follow-up assessment   Maternal Data Has patient been taught Hand Expression?: No Does the patient have breastfeeding experience prior to this delivery?: Yes  Feeding Feeding Type: Breast Milk Length of feed: 10 min (5 on each breast )  LATCH Score/Interventions Latch: Grasps breast easily, tongue down, lips flanged, rhythmical sucking.  Audible Swallowing: A few with stimulation Intervention(s): Skin to skin;Hand expression  Type of Nipple: Everted at rest and after stimulation  Comfort (Breast/Nipple): Soft / non-tender     Hold (Positioning): Assistance needed to correctly position infant at breast and maintain latch. Intervention(s): Breastfeeding basics reviewed;Support Pillows;Position options;Skin to skin  LATCH Score: 8  Lactation Tools Discussed/Used     Consult Status Consult Status: Follow-up Date: 04/03/13 Follow-up  type: In-patient    Alfred Levins 04/02/2013, 7:28 PM

## 2013-04-02 NOTE — Anesthesia Postprocedure Evaluation (Signed)
  Anesthesia Post Note  Patient: Amanda Horne  Procedure(s) Performed: * No procedures listed *  Anesthesia type: Epidural  Patient location: Mother/Baby  Post pain: Pain level controlled  Post assessment: Post-op Vital signs reviewed  Last Vitals:  Filed Vitals:   04/02/13 0400  BP: 111/72  Pulse: 85  Temp: 36.8 C  Resp: 18    Post vital signs: Reviewed  Level of consciousness:alert  Complications: No apparent anesthesia complications

## 2013-04-02 NOTE — Significant Event (Signed)
Rapid Response Event Note  Overview:      Initial Focused Assessment:   Interventions:   Event Summary:  Follow up:  Called for update on RR call - spoke with RN caring for pt.  Repeat H/H 9.4 and 27.8 - will repeat in AM.  IV fluids LR with pitocin remain infusing.  Minimal vaginal bleeding with no further clots noted.  VSS.  Pt feeling much better, resting well.    at      at          [facility], Alden Benjamin

## 2013-04-02 NOTE — Progress Notes (Signed)
IV pit D/C and LR increased to 125 at 0400 on 04/02/13 per nigh shift RN Alinda Money Priddy

## 2013-04-02 NOTE — Progress Notes (Signed)
Postpartum Note Day # 1   S:  Patient resting comfortable in bed.  Pain controlled.  NPO secondary to PPH; feeling hungry this am. No flatus, no BM.  No ambulation. Pt has foley in place. She denies n/v/f/c, SOB, or CP.  No dizziness or headache this am.  Pt plans on breastfeeding.  O: BP 110/74  Pulse 86  Temp(Src) 98.3 F (36.8 C) (Oral)  Resp 20  Ht 5\' 4"  (1.626 m)  Wt 77.111 kg (170 lb)  BMI 29.17 kg/m2  SpO2 99% Tmax: 99  Gen: A&Ox3, NAD CV: RRR, no MRG Resp: CTAB Abdomen: soft, NT, ND Uterus: firm, non-tender, below umbilicus.  Lochia appropriate Ext: No edema, no calf tenderness bilaterally, SCDs in place  Labs:  Lab Results  Component Value Date   WBC 11.2* 04/02/2013   HGB 7.2* 04/02/2013   HCT 21.2* 04/02/2013   MCV 76.5* 04/02/2013   PLT 126* 04/02/2013    A/P: Pt is a 27 y.o. G3P3003 s/p NSVD complicated by PPH.  PPD #1 -PPH:  Total EBL 900cc  Heme: on admit: H/H 11/32.7, current as above.  Continue IV and po hydration today.  Pt asymptomatic, VSS, lochia appropriate.  Pt to ambulate this am, if any signs of dizziness or lightheadedness will consider transfusion.  Repeat CBC in am.  Iron bid - Pain well controlled with toradol, will change to PO pain management -GU: UOP is adequate, plan to remove foley this am -GI: Will advance to general diet this am.  Encourage colace bid -Activity: encouraged sitting up to chair and ambulation as tolerated today -Prophylaxis: SCDs in place while in bed -Baby boy circ to be done today  Myna Hidalgo, DO [phone] (pager) [phone] (office)

## 2013-04-03 LAB — CBC
HCT: 18.1 % — ABNORMAL LOW (ref 36.0–46.0)
HCT: 24.1 % — ABNORMAL LOW (ref 36.0–46.0)
Hemoglobin: 6.2 g/dL — CL (ref 12.0–15.0)
MCH: 26.6 pg (ref 26.0–34.0)
MCHC: 33.7 g/dL (ref 30.0–36.0)
MCV: 77.7 fL — ABNORMAL LOW (ref 78.0–100.0)
MCV: 77.2 fL — ABNORMAL LOW (ref 78.0–100.0)
Platelets: 134 10*3/uL — ABNORMAL LOW (ref 150–400)
Platelets: 144 10*3/uL — ABNORMAL LOW (ref 150–400)
RBC: 3.12 MIL/uL — ABNORMAL LOW (ref 3.87–5.11)
RDW: 14.5 % (ref 11.5–15.5)
RDW: 14.5 % (ref 11.5–15.5)
WBC: 8.1 10*3/uL (ref 4.0–10.5)

## 2013-04-03 MED ORDER — IBUPROFEN 600 MG PO TABS: 600.0000 mg | ORAL_TABLET | Freq: Four times a day (QID) | ORAL | Status: DC

## 2013-04-03 MED ORDER — SENNOSIDES-DOCUSATE SODIUM 8.6-50 MG PO TABS: 2.0000 | ORAL_TABLET | ORAL | Status: DC

## 2013-04-03 MED ORDER — DIPHENHYDRAMINE HCL 25 MG PO CAPS
25.0000 mg | ORAL_CAPSULE | Freq: Once | ORAL | Status: AC
  Administered 2013-04-03: 25 mg via ORAL
  Filled 2013-04-03: qty 1

## 2013-04-03 MED ORDER — ACETAMINOPHEN 325 MG PO TABS
650.0000 mg | ORAL_TABLET | Freq: Once | ORAL | Status: AC
  Administered 2013-04-03: 650 mg via ORAL
  Filled 2013-04-03: qty 2

## 2013-04-03 MED ORDER — FERROUS SULFATE 325 (65 FE) MG PO TABS: 325.0000 mg | ORAL_TABLET | Freq: Two times a day (BID) | ORAL | Status: DC

## 2013-04-03 NOTE — Progress Notes (Signed)
Blood transfusion was running in IV site located in the lt antecubital space, pt c/o discomfort and a knot coming up in her arm where the IV site was located.  Area was raised and had some bruising, site was infiltrated.  Changed IV line to other arm in the right forearm to finish transfusion.  IV removed from left antecubital space and heat applied

## 2013-04-03 NOTE — Discharge Summary (Signed)
Obstetric Discharge Summary Reason for Admission: induction of labor Prenatal Procedures: none Intrapartum Procedures: spontaneous vaginal delivery Postpartum Procedures: transfusion of pRBCs Complications-Operative and Postpartum: hemorrhage requiring transfusion of 2units pRBC Hemoglobin  Date Value Range Status  04/03/2013 8.3* 12.0 - 15.0 g/dL Final     DELTA CHECK NOTED     REPEATED TO VERIFY     POST TRANSFUSION SPECIMEN     HCT  Date Value Range Status  04/03/2013 24.1* 36.0 - 46.0 % Final    Physical Exam:  General: alert and no distress Lochia: appropriate Uterine Fundus: firm DVT Evaluation: No evidence of DVT seen on physical exam.  Discharge Diagnoses: Term Pregnancy-delivered  Discharge Information: Date: 04/03/2013 Activity: pelvic rest Diet: routine Medications: PNV, Ibuprofen, Colace and Iron Condition: stable Instructions: refer to practice specific booklet Discharge to: home Follow-up Information   Follow up with Jessee Avers., MD. Schedule an appointment as soon as possible for a visit in 6 weeks.   Specialty:  Obstetrics and Gynecology   Contact information:   [address] [place] [address] [phone]       Newborn Data: Live born female  Birth Weight: 7 lb 14.3 oz (3580 g) APGAR: 9, 9  Home with mother.  Myna Hidalgo, M 04/03/2013, 8:21 PM

## 2013-04-03 NOTE — Progress Notes (Signed)
Postpartum Note Day # 2  S:  Patient resting comfortable in bed.  Pain controlled.  Tolerating general diet.  + flatus, no BM.  + ambulation. Pt has foley in place. She denies n/v/f/c, SOB, or CP.  No dizziness or headache this am.  Pt breastfeeding.  O: BP 108/70  Pulse 90  Temp(Src) 98.6 F (37 C) (Oral)  Resp 18  Ht 5\' 4"  (1.626 m)  Wt 77.111 kg (170 lb)  BMI 29.17 kg/m2  SpO2 100%  Gen: A&Ox3, NAD CV: RRR, no MRG Resp: CTAB Abdomen: soft, NT, ND Uterus: firm, non-tender, below umbilicus.  Lochia appropriate Ext: No edema, no calf tenderness bilaterally  Labs:  Lab Results  Component Value Date   WBC 8.1 04/03/2013   HGB 6.2* 04/03/2013   HCT 18.1* 04/03/2013   MCV 77.7* 04/03/2013   PLT 134* 04/03/2013    A/P: Pt is a 27 y.o. W2N5621 s/p NSVD complicated by PPH.  PPD #1 -Postpartum Hemorrhage:    Heme: on admit: H/H 11/32.7, current Hgb as above.  Due to Hgb <7 will plan for transfusion of 2u pRBC.  Reviewed R/B/I and alternatives for transfusion, pt voiced understanding and consented for transfusion.  Will repeat CBC, 6hr s/p transfusion.  Pt remains asymptomatic, VSS, lochia appropriate.    Continue Iron bid - Pain well controlled with po pain management -GU: voiding freely -GI: Tolerating gen diet, encourage Senn[place] as needed -Activity: encouraged sitting up to chair and ambulation as tolerated  -Prophylaxis: encourage ambulation -Baby boy circ done  If the patient remians stable after transfusion and appropriate rise of CBC, will plan on possible discharge home later tonight.  Myna Hidalgo, DO [phone] (pager) [phone] (office)

## 2013-04-03 NOTE — Lactation Note (Signed)
This note was copied from the chart of Boy Traniyah Delahoz. Lactation Consultation Note  Patient Name: Boy Ebonee Stober [id number] Date: 04/03/2013 Reason for consult: Follow-up assessment Mom is breast and bottle feeding. Mom had 2 units of PRBC this am due to [facility]. Mom reports she feels her breasts are filling. She reports they feel heavier. Mom had just pumped with Harmony Hand pump and received 5 ml of colostrum. Mom reports she has breast pump for home use. Encouraged Mom to breastfeed with every feeding, keep baby active at the breast for 15-20 minutes both breasts before giving any supplements to encourage milk production, prevent engorgement and protect milk supply. Advised Mom to post pump at least 4 times per day till milk is in. Once milk is in, only pump to comfort to relieve engorgement if this occurs. Advised Mom of OP services and support group.   Maternal Data    Feeding Feeding Type: Bottle Fed - Formula Length of feed: 20 min  LATCH Score/Interventions                      Lactation Tools Discussed/Used Tools: Pump Breast pump type: Manual   Consult Status Consult Status: Complete Date: 04/03/13 Follow-up type: In-patient    Alfred Levins 04/03/2013, 5:53 PM

## 2013-04-03 NOTE — Discharge Instructions (Signed)

## 2013-04-04 LAB — TYPE AND SCREEN: Unit division: 0

## 2013-04-07 NOTE — Progress Notes (Signed)
Post discharge chart review completed.  

## 2013-06-11 ENCOUNTER — Encounter (HOSPITAL_COMMUNITY): Payer: Self-pay | Admitting: Emergency Medicine

## 2013-06-11 ENCOUNTER — Emergency Department (HOSPITAL_COMMUNITY)
Admission: EM | Admit: 2013-06-11 | Discharge: 2013-06-11 | Disposition: A | Discharge status: Home / Self Care | Payer: 59 | Attending: Emergency Medicine | Admitting: Emergency Medicine

## 2013-06-11 DIAGNOSIS — R509 Fever, unspecified: Secondary | ICD-10-CM

## 2013-06-11 DIAGNOSIS — J069 Acute upper respiratory infection, unspecified: Secondary | ICD-10-CM | POA: Insufficient documentation

## 2013-06-11 DIAGNOSIS — R110 Nausea: Secondary | ICD-10-CM | POA: Insufficient documentation

## 2013-06-11 DIAGNOSIS — Z79899 Other long term (current) drug therapy: Secondary | ICD-10-CM | POA: Insufficient documentation

## 2013-06-11 DIAGNOSIS — Z791 Long term (current) use of non-steroidal anti-inflammatories (NSAID): Secondary | ICD-10-CM | POA: Insufficient documentation

## 2013-06-11 DIAGNOSIS — Z8619 Personal history of other infectious and parasitic diseases: Secondary | ICD-10-CM | POA: Insufficient documentation

## 2013-06-11 MED ORDER — ONDANSETRON 8 MG PO TBDP: 8.0000 mg | ORAL_TABLET | Freq: Three times a day (TID) | ORAL | Status: DC | PRN

## 2013-06-11 MED ORDER — IBUPROFEN 400 MG PO TABS
600.0000 mg | ORAL_TABLET | Freq: Once | ORAL | Status: AC
  Administered 2013-06-11: 600 mg via ORAL
  Filled 2013-06-11: qty 2

## 2013-06-11 MED ORDER — ONDANSETRON 8 MG PO TBDP
8.0000 mg | ORAL_TABLET | Freq: Once | ORAL | Status: AC
  Administered 2013-06-11: 8 mg via ORAL
  Filled 2013-06-11: qty 1

## 2013-06-11 MED ORDER — ACETAMINOPHEN 325 MG PO TABS
650.0000 mg | ORAL_TABLET | Freq: Once | ORAL | Status: AC
  Administered 2013-06-11: 650 mg via ORAL
  Filled 2013-06-11: qty 2

## 2013-06-11 NOTE — ED Provider Notes (Signed)
CSN: [id number]     Arrival date & time 06/11/13  0506 History   First MD Initiated Contact with Patient 06/11/13 [phone]     Chief Complaint  Patient presents with  . Fever  . Emesis    HPI Patient reports headache and nasal congestion as well as chills and fever over the past 8 hours.  She checked Tylenol and Benadryl without relief.  She is a 59-week-old baby at home whom she is breast-feeding.  She works as a Engineer, civil (consulting) in the NICU.  She states that the last baby she was caring for his recently diagnosed with rhinovirus.  She reports nausea without vomiting.  She denies chest pain or abdominal pain.  No diarrhea.  No urinary complaints.   Past Medical History  Diagnosis Date  . Abnormal Pap smear   . Herpes   . Hx of varicella    Past Surgical History  Procedure Laterality Date  . Colposcopy    . Wisdom tooth extraction    . No past surgeries     Family History  Problem Relation Age of Onset  . Hypertension Mother   . Diabetes Mother   . Diabetes Sister   . Other Paternal Uncle     Wiskott Aldnch syndrome, died as a child   History  Substance Use Topics  . Smoking status: Never Smoker   . Smokeless tobacco: Never Used  . Alcohol Use: No   OB History   Grav Para Term Preterm Abortions TAB SAB Ect Mult Living   3 3 3       3      Review of Systems  All other systems reviewed and are negative.    Allergies  Review of patient's allergies indicates no known allergies.  Home Medications   Current Outpatient Rx  Name  Route  Sig  Dispense  Refill  . flintstones complete (FLINTSTONES) 60 MG chewable tablet   Oral   Chew 2 tablets by mouth daily.         . valACYclovir (VALTREX) 500 MG tablet   Oral   Take 500 mg by mouth 2 (two) times daily.         [name] docusate sodium (COLACE) 100 MG capsule   Oral   Take 100 mg by mouth 2 (two) times daily.         . ferrous sulfate 325 (65 FE) MG tablet   Oral   Take 1 tablet (325 mg total) by mouth 2 (two) times daily  with a meal.   60 tablet   3   . ibuprofen (ADVIL,MOTRIN) 600 MG tablet   Oral   Take 1 tablet (600 mg total) by mouth every 6 (six) hours.   30 tablet   0   . lisinopril-hydrochlorothiazide (PRINZIDE,ZESTORETIC) 10-12.5 MG per tablet   Oral   Take 1 tablet by mouth daily. RX started last week for hypertension but states she is not taking it because she is not having any problems         . senna-docusate (SENOKOT-S) 8.6-50 MG per tablet   Oral   Take 2 tablets by mouth daily.   60 tablet   3    BP 148/100  Pulse 95  Temp(Src) 100.9 F (38.3 C) (Oral)  Ht 5\' 4"  (1.626 m)  Wt 163 lb (73.936 kg)  BMI 27.97 kg/m2  SpO2 96%  LMP 05/20/2013  Breastfeeding? Yes Physical Exam  Nursing note and vitals reviewed. Constitutional: She is oriented to person, place,  and time. She appears well-developed and well-nourished. No distress.  HENT:  Head: Normocephalic and atraumatic.  Eyes: EOM are normal.  Neck: Normal range of motion.  Cardiovascular: Normal rate, regular rhythm and normal heart sounds.   Pulmonary/Chest: Effort normal and breath sounds normal.  Abdominal: Soft. She exhibits no distension. There is no tenderness.  Musculoskeletal: Normal range of motion.  Neurological: She is alert and oriented to person, place, and time.  Skin: Skin is warm and dry.  Psychiatric: She has a normal mood and affect. Judgment normal.    ED Course  Procedures (including critical care time) Labs Review Labs Reviewed - No data to display Imaging Review No results found.  EKG Interpretation   None       MDM  No diagnosis found. Likely viral upper respiratory tract infections.  The patient is well-appearing.  She is nontoxic.  No hypoxia on exam.  Lung exam is clear.  Normal work of breathing.  No indication for chest x-ray.  Close followup with PCP. Abdominal exam benign. Dc home    Amanda CoKevin M Keiondre Colee, MD 06/11/13 [phone]

## 2013-06-11 NOTE — ED Notes (Signed)
Onset of headache, stuffiness, chills, fever, and nausea x 8 hours.  Took tylenol and benadryl without relief.  (Breastfeeding)

## 2014-03-21 ENCOUNTER — Encounter (HOSPITAL_COMMUNITY): Payer: Self-pay | Admitting: Emergency Medicine

## 2015-05-16 ENCOUNTER — Emergency Department (HOSPITAL_COMMUNITY)
Admission: EM | Admit: 2015-05-16 | Discharge: 2015-05-16 | Disposition: A | Discharge status: Home / Self Care | Payer: 59

## 2015-05-16 NOTE — ED Notes (Signed)
Pt came up to the tech first desk and stated she had a Dr appt Wed morning and was going to wait and see him. Pt left AMA.

## 2016-08-15 ENCOUNTER — Emergency Department (HOSPITAL_COMMUNITY)
Admission: EM | Admit: 2016-08-15 | Discharge: 2016-08-16 | Disposition: A | Discharge status: Home / Self Care | Payer: 59 | Attending: Emergency Medicine | Admitting: Emergency Medicine

## 2016-08-15 ENCOUNTER — Encounter (HOSPITAL_COMMUNITY): Payer: Self-pay | Admitting: Emergency Medicine

## 2016-08-15 DIAGNOSIS — R112 Nausea with vomiting, unspecified: Secondary | ICD-10-CM | POA: Diagnosis not present

## 2016-08-15 LAB — LIPASE, BLOOD: Lipase: 13 U/L (ref 11–51)

## 2016-08-15 LAB — CBC
HEMATOCRIT: 42.6 % (ref 36.0–46.0)
Hemoglobin: 13.8 g/dL (ref 12.0–15.0)
MCH: 24.6 pg — ABNORMAL LOW (ref 26.0–34.0)
MCHC: 32.4 g/dL (ref 30.0–36.0)
MCV: 75.9 fL — ABNORMAL LOW (ref 78.0–100.0)
Platelets: 259 10*3/uL (ref 150–400)
RBC: 5.61 MIL/uL — ABNORMAL HIGH (ref 3.87–5.11)
RDW: 14.2 % (ref 11.5–15.5)
WBC: 10.6 10*3/uL — AB (ref 4.0–10.5)

## 2016-08-15 LAB — COMPREHENSIVE METABOLIC PANEL
ALT: 29 U/L (ref 14–54)
AST: 26 U/L (ref 15–41)
Albumin: 4.4 g/dL (ref 3.5–5.0)
Alkaline Phosphatase: 59 U/L (ref 38–126)
Anion gap: 15 (ref 5–15)
BUN: 11 mg/dL (ref 6–20)
CO2: 24 mmol/L (ref 22–32)
Calcium: 10 mg/dL (ref 8.9–10.3)
Chloride: 102 mmol/L (ref 101–111)
Creatinine, Ser: 0.8 mg/dL (ref 0.44–1.00)
Glucose, Bld: 121 mg/dL — ABNORMAL HIGH (ref 65–99)
POTASSIUM: 3.7 mmol/L (ref 3.5–5.1)
Sodium: 141 mmol/L (ref 135–145)
Total Bilirubin: 0.6 mg/dL (ref 0.3–1.2)
Total Protein: 8.2 g/dL — ABNORMAL HIGH (ref 6.5–8.1)

## 2016-08-15 LAB — I-STAT BETA HCG BLOOD, ED (MC, WL, AP ONLY): I-stat hCG, quantitative: <5 m[IU]/mL (ref <5)

## 2016-08-15 MED ORDER — ONDANSETRON HCL 4 MG/2ML IJ SOLN
4.0000 mg | Freq: Once | INTRAMUSCULAR | Status: AC
  Administered 2016-08-15: 4 mg via INTRAVENOUS
  Filled 2016-08-15: qty 2

## 2016-08-15 MED ORDER — IBUPROFEN 200 MG PO TABS
ORAL_TABLET | ORAL | Status: AC
  Administered 2016-08-15: 600 mg
  Filled 2016-08-15: qty 3

## 2016-08-15 MED ORDER — ONDANSETRON 4 MG PO TBDP
ORAL_TABLET | ORAL | Status: AC
  Administered 2016-08-15: 4 mg via SUBLINGUAL
  Filled 2016-08-15: qty 1

## 2016-08-15 MED ORDER — SODIUM CHLORIDE 0.9 % IV BOLUS (SEPSIS)
1000.0000 mL | Freq: Once | INTRAVENOUS | Status: AC
  Administered 2016-08-15: 1000 mL via INTRAVENOUS

## 2016-08-15 NOTE — ED Notes (Signed)
Pt attempted to urinate, but was unable to go. Pt will try later.

## 2016-08-15 NOTE — ED Notes (Signed)
Pt notified that we need a urine sample and asked to push the call light when she thinks she can go.

## 2016-08-15 NOTE — ED Triage Notes (Signed)
Pt states she has been vomiting for the past two days, feeling weak. Cannot keep food down. Pt also states she has a headache. Denies any diarrhea

## 2016-08-15 NOTE — ED Notes (Addendum)
Approximately 1940, patient stopped me in lobby, indicates that her head is hurting much worse, 9/10 and she is nauseous. She had not received any meds in triage. I discussed Zofran OTD, suggested it may help her nausea then she could try some ibuprofen. Patient agreed with plan. 4mg  Zofran ODT given, then @ 10 minutes later 600 mg ibuprofen given with sips of gingerale. Also requesting ice pack. Explained delay and wait time to patient and family.

## 2016-08-15 NOTE — ED Provider Notes (Signed)
[facility]-EMERGENCY DEPT Provider Note   CSN: [id number] Arrival date & time: 08/15/16  1713     History   Chief Complaint Chief Complaint  Patient presents with  . Emesis    HPI Amanda Horne is a 31 y.o. female.  HPI This is a 31 year old female who presents today saying that she became nauseated yesterday and vomited up to 6 times through the day. She again had vomiting today. She has been consistently nauseated. She has had some subjective fever. She has not had any abdominal pain or diarrhea. She has had no known sick contacts. She has not had similar episodes in the past. She states that she is on Depoprovera does not think that she is pregnant with her last shot in February. She denies any cough, abnormal vaginal discharge, or urinary tract infection symptoms. She does have an associated headache. Past Medical History:  Diagnosis Date  . Abnormal Pap smear   . Herpes   . Hx of varicella     There are no active problems to display for this patient.   Past Surgical History:  Procedure Laterality Date  . COLPOSCOPY    . NO PAST SURGERIES    . WISDOM TOOTH EXTRACTION      OB History    Gravida Para Term Preterm AB Living   3 3 3     3    SAB TAB Ectopic Multiple Live Births           3       Home Medications    Prior to Admission medications   Medication Sig Start Date End Date Taking? Authorizing Provider  phentermine (ADIPEX-P) 37.5 MG tablet Take 37.5 mg by mouth daily as needed (FOR ENERGY/WEIGHT).    Yes Historical Provider, MD    Family History Family History  Problem Relation Age of Onset  . Hypertension Mother   . Diabetes Mother   . Diabetes Sister   . Other Paternal Uncle     Wiskott Aldnch syndrome, died as a child    Social History Social History  Substance Use Topics  . Smoking status: Never Smoker  . Smokeless tobacco: Never Used  . Alcohol use No     Allergies   Patient has no known allergies.   Review of Systems Review of  Systems  All other systems reviewed and are negative.    Physical Exam Updated Vital Signs BP 126/86 (BP Location: Right Arm)   Pulse 80   Temp 98.7 F (37.1 C) (Oral)   Resp 14   SpO2 97%   Physical Exam  Constitutional: She is oriented to person, place, and time. She appears well-developed and well-nourished. No distress.  HENT:  Head: Normocephalic and atraumatic.  Right Ear: External ear normal.  Left Ear: External ear normal.  Nose: Nose normal.  Mouth/Throat: Oropharynx is clear and moist.  Eyes: Conjunctivae and EOM are normal. Pupils are equal, round, and reactive to light.  Neck: Normal range of motion. Neck supple.  Cardiovascular: Normal rate, regular rhythm, normal heart sounds and intact distal pulses.   Pulmonary/Chest: Effort normal and breath sounds normal.  Abdominal: Soft. Bowel sounds are normal.  Musculoskeletal: Normal range of motion.  Neurological: She is alert and oriented to person, place, and time.  Skin: Capillary refill takes less than 2 seconds.  Psychiatric: She has a normal mood and affect. Her behavior is normal. Judgment and thought content normal.  Nursing note and vitals reviewed.    ED Treatments / Results  Labs (all labs ordered are listed, but only abnormal results are displayed) Labs Reviewed  COMPREHENSIVE METABOLIC PANEL - Abnormal; Notable for the following:       Result Value   Glucose, Bld 121 (*)    Total Protein 8.2 (*)    All other components within normal limits  CBC - Abnormal; Notable for the following:    WBC 10.6 (*)    RBC 5.61 (*)    MCV 75.9 (*)    MCH 24.6 (*)    All other components within normal limits  LIPASE, BLOOD  URINALYSIS, ROUTINE W REFLEX MICROSCOPIC  POC URINE PREG, ED  I-STAT BETA HCG BLOOD, ED ([facility], [facility], [facility] ONLY)    EKG  EKG Interpretation None       Radiology No results found.  Procedures Procedures (including critical care time)  Medications Ordered in ED Medications  sodium  chloride 0.9 % bolus 1,000 mL (not administered)  ondansetron (ZOFRAN) injection 4 mg (not administered)  ibuprofen (ADVIL,MOTRIN) 200 MG tablet (600 mg  Given 08/15/16 2008)  ondansetron (ZOFRAN-ODT) 4 MG disintegrating tablet (4 mg Sublingual Given 08/15/16 2009)     Initial Impression / Assessment and Plan / ED Course  I have reviewed the triage vital signs and the nursing notes.  Pertinent labs & imaging results that were available during my care of the patient were reviewed by me and considered in my medical decision making (see chart for details).     31 year old female with vomiting. She is given IV fluids and Zofran and is tolerating fluids without difficulty. She has a history of hypertension and her blood pressure has been noted on 2 cycles here to be elevated but has been down to 126/80 on my last evaluation. We discussed that she should have this followed up as an outpatient with her primary care physician. She is also mildly hyperglycemic with a blood sugar of 121. She is advised to have this followed up by her primary care physician. Final Clinical Impressions(s) / ED Diagnoses   Final diagnoses:  Nausea and vomiting, intractability of vomiting not specified, unspecified vomiting type    New Prescriptions New Prescriptions   No medications on file     Margarita Grizzle, MD 08/16/16 0011

## 2016-08-16 MED ORDER — ACETAMINOPHEN 325 MG PO TABS
650.0000 mg | ORAL_TABLET | Freq: Once | ORAL | Status: AC
  Administered 2016-08-16: 650 mg via ORAL
  Filled 2016-08-16: qty 2

## 2016-08-16 MED ORDER — ONDANSETRON 8 MG PO TBDP: 8.0000 mg | ORAL_TABLET | Freq: Three times a day (TID) | ORAL | 0 refills | Status: DC | PRN

## 2016-08-20 ENCOUNTER — Inpatient Hospital Stay (HOSPITAL_COMMUNITY)
Admission: EM | Admit: 2016-08-20 | Discharge: 2016-08-24 | DRG: 060 | Disposition: A | Discharge status: Home / Self Care | Payer: 59 | Attending: Internal Medicine | Admitting: Internal Medicine

## 2016-08-20 ENCOUNTER — Emergency Department (HOSPITAL_COMMUNITY): Payer: 59

## 2016-08-20 ENCOUNTER — Encounter (HOSPITAL_COMMUNITY): Payer: Self-pay | Admitting: Emergency Medicine

## 2016-08-20 DIAGNOSIS — R51 Headache: Secondary | ICD-10-CM

## 2016-08-20 DIAGNOSIS — G35A Relapsing-remitting multiple sclerosis: Secondary | ICD-10-CM | POA: Diagnosis present

## 2016-08-20 DIAGNOSIS — R519 Headache, unspecified: Secondary | ICD-10-CM

## 2016-08-20 DIAGNOSIS — H532 Diplopia: Secondary | ICD-10-CM

## 2016-08-20 DIAGNOSIS — R200 Anesthesia of skin: Secondary | ICD-10-CM

## 2016-08-20 DIAGNOSIS — R42 Dizziness and giddiness: Secondary | ICD-10-CM

## 2016-08-20 DIAGNOSIS — H5121 Internuclear ophthalmoplegia, right eye: Secondary | ICD-10-CM | POA: Diagnosis present

## 2016-08-20 DIAGNOSIS — Z833 Family history of diabetes mellitus: Secondary | ICD-10-CM | POA: Diagnosis not present

## 2016-08-20 DIAGNOSIS — I10 Essential (primary) hypertension: Secondary | ICD-10-CM | POA: Diagnosis not present

## 2016-08-20 DIAGNOSIS — Z8249 Family history of ischemic heart disease and other diseases of the circulatory system: Secondary | ICD-10-CM | POA: Diagnosis not present

## 2016-08-20 DIAGNOSIS — G35 Multiple sclerosis: Secondary | ICD-10-CM | POA: Diagnosis not present

## 2016-08-20 LAB — BASIC METABOLIC PANEL
Anion gap: 8 (ref 5–15)
BUN: 10 mg/dL (ref 6–20)
CO2: 26 mmol/L (ref 22–32)
Calcium: 9.3 mg/dL (ref 8.9–10.3)
Chloride: 105 mmol/L (ref 101–111)
Creatinine, Ser: 0.54 mg/dL (ref 0.44–1.00)
GFR calc Af Amer: >60 mL/min (ref >60)
GFR calc non Af Amer: >60 mL/min (ref >60)
Glucose, Bld: 101 mg/dL — ABNORMAL HIGH (ref 65–99)
Potassium: 3.7 mmol/L (ref 3.5–5.1)
Sodium: 139 mmol/L (ref 135–145)

## 2016-08-20 LAB — CBC WITH DIFFERENTIAL/PLATELET
BASOS ABS: 0 10*3/uL (ref 0.0–0.1)
Basophils Relative: 0 %
Eosinophils Absolute: 0 10*3/uL (ref 0.0–0.7)
Eosinophils Relative: 0 %
HEMATOCRIT: 42 % (ref 36.0–46.0)
Hemoglobin: 13.9 g/dL (ref 12.0–15.0)
Lymphocytes Relative: 18 %
Lymphs Abs: 2.1 10*3/uL (ref 0.7–4.0)
MCH: 25.3 pg — ABNORMAL LOW (ref 26.0–34.0)
MCHC: 33.1 g/dL (ref 30.0–36.0)
MCV: 76.4 fL — ABNORMAL LOW (ref 78.0–100.0)
MONO ABS: 0.6 10*3/uL (ref 0.1–1.0)
Monocytes Relative: 5 %
Neutro Abs: 9.1 10*3/uL — ABNORMAL HIGH (ref 1.7–7.7)
Neutrophils Relative %: 77 %
Platelets: 270 10*3/uL (ref 150–400)
RBC: 5.5 MIL/uL — ABNORMAL HIGH (ref 3.87–5.11)
RDW: 14.2 % (ref 11.5–15.5)
WBC: 11.8 10*3/uL — AB (ref 4.0–10.5)

## 2016-08-20 LAB — I-STAT BETA HCG BLOOD, ED (MC, WL, AP ONLY): I-stat hCG, quantitative: <5 m[IU]/mL (ref <5)

## 2016-08-20 MED ORDER — PROCHLORPERAZINE EDISYLATE 5 MG/ML IJ SOLN
10.0000 mg | Freq: Once | INTRAMUSCULAR | Status: AC
  Administered 2016-08-20: 10 mg via INTRAVENOUS
  Filled 2016-08-20: qty 2

## 2016-08-20 MED ORDER — SODIUM CHLORIDE 0.9% FLUSH
3.0000 mL | Freq: Two times a day (BID) | INTRAVENOUS | Status: DC
  Administered 2016-08-21 – 2016-08-23 (×4): 3 mL via INTRAVENOUS

## 2016-08-20 MED ORDER — MECLIZINE HCL 25 MG PO TABS
25.0000 mg | ORAL_TABLET | Freq: Once | ORAL | Status: AC
  Administered 2016-08-20: 25 mg via ORAL
  Filled 2016-08-20: qty 1

## 2016-08-20 MED ORDER — GADOBENATE DIMEGLUMINE 529 MG/ML IV SOLN
15.0000 mL | Freq: Once | INTRAVENOUS | Status: AC | PRN
  Administered 2016-08-20: 15 mL via INTRAVENOUS

## 2016-08-20 MED ORDER — ONDANSETRON HCL 4 MG PO TABS: 4.0000 mg | ORAL_TABLET | Freq: Four times a day (QID) | ORAL | Status: DC | PRN

## 2016-08-20 MED ORDER — ACETAMINOPHEN 325 MG PO TABS
650.0000 mg | ORAL_TABLET | Freq: Four times a day (QID) | ORAL | Status: DC | PRN
  Administered 2016-08-21: 650 mg via ORAL
  Filled 2016-08-20 (×2): qty 2

## 2016-08-20 MED ORDER — HYDROCODONE-ACETAMINOPHEN 5-325 MG PO TABS
1.0000 | ORAL_TABLET | Freq: Four times a day (QID) | ORAL | Status: DC | PRN
  Administered 2016-08-20 – 2016-08-21 (×4): 1 via ORAL
  Filled 2016-08-20 (×4): qty 1

## 2016-08-20 MED ORDER — ACETAMINOPHEN 650 MG RE SUPP: 650.0000 mg | Freq: Four times a day (QID) | RECTAL | Status: DC | PRN

## 2016-08-20 MED ORDER — SODIUM CHLORIDE 0.9 % IV SOLN
1000.0000 mg | Freq: Once | INTRAVENOUS | Status: AC
  Administered 2016-08-20: 1000 mg via INTRAVENOUS
  Filled 2016-08-20: qty 8

## 2016-08-20 MED ORDER — SODIUM CHLORIDE 0.9 % IV BOLUS (SEPSIS)
1000.0000 mL | Freq: Once | INTRAVENOUS | Status: AC
  Administered 2016-08-20: 1000 mL via INTRAVENOUS

## 2016-08-20 MED ORDER — ONDANSETRON HCL 4 MG/2ML IJ SOLN
4.0000 mg | Freq: Four times a day (QID) | INTRAMUSCULAR | Status: DC | PRN
  Administered 2016-08-21: 4 mg via INTRAVENOUS
  Filled 2016-08-20: qty 2

## 2016-08-20 NOTE — ED Notes (Signed)
Patient transported to MRI 

## 2016-08-20 NOTE — ED Provider Notes (Signed)
Patient signed out to me by Dr. Lynelle Doctor and MRI results consistent with multiple sclerosis. Discussed with neurology on call, recommend patient be started on Solu-Medrol and admitted to the hospitalist service.   Lorre Nick, MD 08/20/16 2000

## 2016-08-20 NOTE — ED Notes (Signed)
Pt still in MRI, will update vitals when pt returns.

## 2016-08-20 NOTE — ED Triage Notes (Addendum)
Pt reports R side facial numbness, vertigo, and emesis for the past 3 days. Also began to have R eye deviation that began today. No emesis or pain. Was seen by doctor yesterday.

## 2016-08-20 NOTE — ED Provider Notes (Signed)
[facility]-EMERGENCY DEPT Provider Note   CSN: [id number] Arrival date & time: 08/20/16  1407     History   Chief Complaint Chief Complaint  Patient presents with  . Eye Problem  . facial numbness    HPI Amanda Horne is a 31 y.o. female.  HPI Pt started having trouble with dizziness and vomiting last Wednesday.  Her symptoms are worsening.  She is having double vision, headache and right facial numbness.  She saw her doctor and was diagnosed with vertigo yesterday.  Today her face is numb and her symptoms.  No fever.  No injuries. Past Medical History:  Diagnosis Date  . Abnormal Pap smear   . Herpes   . Hx of varicella     There are no active problems to display for this patient.   Past Surgical History:  Procedure Laterality Date  . COLPOSCOPY    . NO PAST SURGERIES    . WISDOM TOOTH EXTRACTION      OB History    Gravida Para Term Preterm AB Living   3 3 3     3    SAB TAB Ectopic Multiple Live Births           3       Home Medications    Prior to Admission medications   Medication Sig Start Date End Date Taking? Authorizing Provider  aspirin-acetaminophen-caffeine (EXCEDRIN MIGRAINE) [phone] MG tablet Take 1 tablet by mouth every 6 (six) hours as needed for headache.   Yes Historical Provider, MD  ibuprofen (ADVIL,MOTRIN) 200 MG tablet Take 800 mg by mouth every 6 (six) hours as needed (migraine).   Yes Historical Provider, MD  MedroxyPROGESTERone Acetate (DEPO-PROVERA IM) Inject into the muscle every 3 (three) months.   Yes Historical Provider, MD  ondansetron (ZOFRAN ODT) 8 MG disintegrating tablet Take 1 tablet (8 mg total) by mouth every 8 (eight) hours as needed for nausea or vomiting. 08/16/16  Yes Margarita Grizzle, MD  phentermine (ADIPEX-P) 37.5 MG tablet Take 37.5 mg by mouth daily as needed (FOR ENERGY/WEIGHT).    Yes Historical Provider, MD    Family History Family History  Problem Relation Age of Onset  . Hypertension Mother   . Diabetes Mother     . Diabetes Sister   . Other Paternal Uncle     Wiskott Aldnch syndrome, died as a child    Social History Social History  Substance Use Topics  . Smoking status: Never Smoker  . Smokeless tobacco: Never Used  . Alcohol use No     Allergies   Patient has no known allergies.   Review of Systems Review of Systems  All other systems reviewed and are negative.    Physical Exam Updated Vital Signs BP (!) 147/108   Pulse 85   Temp 98.1 F (36.7 C) (Oral)   Resp 15   SpO2 97%   Physical Exam  Constitutional: She is oriented to person, place, and time. She appears well-developed and well-nourished. No distress.  HENT:  Head: Normocephalic and atraumatic.  Right Ear: External ear normal.  Left Ear: External ear normal.  Mouth/Throat: Oropharynx is clear and moist.  Eyes: Conjunctivae are normal. Right eye exhibits no discharge. Left eye exhibits no discharge. No scleral icterus.  Neck: Neck supple. No tracheal deviation present.  Cardiovascular: Normal rate, regular rhythm and intact distal pulses.   Pulmonary/Chest: Effort normal and breath sounds normal. No stridor. No respiratory distress. She has no wheezes. She has no rales.  Abdominal:  Soft. Bowel sounds are normal. She exhibits no distension. There is no tenderness. There is no rebound and no guarding.  Musculoskeletal: She exhibits no edema or tenderness.  Neurological: She is alert and oriented to person, place, and time. She has normal strength. No sensory deficit. Cranial nerve deficit: No facial droop, extraocular movements intact, tongue midline  , nystagmus bilaterally. She exhibits normal muscle tone. She displays no seizure activity. Coordination normal.  No pronator drift bilateral upper extrem, able to hold both legs off bed for 5 seconds, sensation intact in all extremities, no visual field cuts, no left or right sided neglect, normal finger-nose exam bilaterally, no nystagmus noted   Skin: Skin is warm and  dry. No rash noted.  Psychiatric: She has a normal mood and affect.  Nursing note and vitals reviewed.    ED Treatments / Results  Labs (all labs ordered are listed, but only abnormal results are displayed) Labs Reviewed  CBC WITH DIFFERENTIAL/PLATELET - Abnormal; Notable for the following:       Result Value   WBC 11.8 (*)    RBC 5.50 (*)    MCV 76.4 (*)    MCH 25.3 (*)    Neutro Abs 9.1 (*)    All other components within normal limits  BASIC METABOLIC PANEL - Abnormal; Notable for the following:    Glucose, Bld 101 (*)    All other components within normal limits  I-STAT BETA HCG BLOOD, ED ([facility], [facility], [facility] ONLY)    Radiology No results found.  Procedures Procedures (including critical care time)  Medications Ordered in ED Medications  sodium chloride 0.9 % bolus 1,000 mL (1,000 mLs Intravenous New Bag/Given 08/20/16 1514)  prochlorperazine (COMPAZINE) injection 10 mg (10 mg Intravenous Given 08/20/16 1514)  meclizine (ANTIVERT) tablet 25 mg (25 mg Oral Given 08/20/16 1514)     Initial Impression / Assessment and Plan / ED Course  I have reviewed the triage vital signs and the nursing notes.  Pertinent labs & imaging results that were available during my care of the patient were reviewed by me and considered in my medical decision making (see chart for details).   patient presented to the emergency room with complaints of blurred vision, facial numbness, vertigo and diplopia.  Because of her neurologic symptoms associated with the vertigo have ordered an MRI to evaluate for any acute abnormality, such as MS, stroke.  Final Clinical Impressions(s) / ED Diagnoses  Pending  Dr Freida Busman will follow up on the result   Linwood Dibbles, MD 08/20/16 1736

## 2016-08-20 NOTE — ED Notes (Signed)
MD at bedside. 

## 2016-08-20 NOTE — H&P (Signed)
History and Physical    Amanda Horne BJY:[id number] DOB: November 07, 1985 DOA: 08/20/2016  PCP: Cassell Smiles, MD  [facility]  Patient coming from: Home  Chief Complaint: Dizziness, headache, nausea/vomiting, face numbness, double vision, right gaze preference  HPI: Amanda Horne is a 31 y.o. woman with a history of varicella and herpes infections who presents to the ED for evaluation after developing progressive neurological symptoms.  She is accompanied by several family members who assist with her clinical history.  The patient presented to the ED at [facility] on Thursday 3/29 with complaints of nausea and vomiting.  She received IV fluids and zofran.  She improved.  Her diagnosis was a viral illness.  By Saturday, the family called 911 because she was having new dizziness and headache.  She was assessed at home, and reportedly transport to the ED was "refused".  She was advised to follow-up with her PCP, which she did on yesterday.  She reported persistent symptoms, and was diagnosed with vertigo.  She was given a prescription for meclizine, which she has taken approximately three times in the past 24 hours, with no improvement in her condition.  She has also taken Excedrin for her headache, without sustained relief.  Today, she developed double vision and a right gaze preference, which prompted presentation to the ED.  No documented fever, but she has had chills and sweats.   She denies cough, cold, or congestion.  No syncope.  No LUTS.  No focal weakness.    ED Course: Unfortunately, MRI shows at least two infratentorial and greater than 10 supratentorial white matter lesions compatible with moderate chronic demyelination.  She also has right temporal and left frontal lobe acute enhancing demyelinating plaques.  Findings concerning for MS.  Neurology has been consulted.  Hospitalist asked to admit.  Review of Systems: As per HPI otherwise 10 systems reviewed and negative except  as stated in the HPI.   Past Medical History:  Diagnosis Date  . Abnormal Pap smear   . Herpes   . Hx of varicella   Post partum hemorrhage requiring blood transfusion Post partum HTN, off medications for at least three months  Past Surgical History:  Procedure Laterality Date  . COLPOSCOPY    . WISDOM TOOTH EXTRACTION       reports that she has never smoked. She has never used smokeless tobacco. She reports that she does not drink alcohol or use drugs.  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 She has three children; all vaginal deliveries.  No Known Allergies  Family History  Problem Relation Age of Onset  . Hypertension Mother   . Diabetes Mother   . Diabetes Sister   . Other Paternal Uncle     Wiskott Aldnch syndrome, died as a child  No known family history of MS.   Prior to Admission medications   Medication Sig Start Date End Date Taking? Authorizing Provider  aspirin-acetaminophen-caffeine (EXCEDRIN MIGRAINE) [phone] MG tablet Take 1 tablet by mouth every 6 (six) hours as needed for headache.   Yes Historical Provider, MD  ibuprofen (ADVIL,MOTRIN) 200 MG tablet Take 800 mg by mouth every 6 (six) hours as needed (migraine).   Yes Historical Provider, MD  MedroxyPROGESTERone Acetate (DEPO-PROVERA IM) Inject into the muscle every 3 (three) months.   Yes Historical Provider, MD  ondansetron (ZOFRAN ODT) 8 MG disintegrating tablet Take 1 tablet (8 mg total) by mouth every 8 (eight) hours as  needed for nausea or vomiting. 08/16/16  Yes Margarita Grizzle, MD  phentermine (ADIPEX-P) 37.5 MG tablet Take 37.5 mg by mouth daily as needed (FOR ENERGY/WEIGHT).    Yes Historical Provider, MD    Physical Exam: Vitals:   08/20/16 1900 08/20/16 1930 08/20/16 2000 08/20/16 2030  BP: (!) 145/93 (!) 141/93 (!) 146/98 (!) 146/101  Pulse: 81 91 100 88  Resp: 15 16 16 15   Temp:      TempSrc:      SpO2: 98% 94% 96% 96%      Constitutional: NAD, calm, flat  affect and lying in the dark and wearing dark sunglasses when I enter the room due to photosensitivity, no acute decompensation Vitals:   08/20/16 1900 08/20/16 1930 08/20/16 2000 08/20/16 2030  BP: (!) 145/93 (!) 141/93 (!) 146/98 (!) 146/101  Pulse: 81 91 100 88  Resp: 15 16 16 15   Temp:      TempSrc:      SpO2: 98% 94% 96% 96%   Eyes: pupils appear equal, EOMI + nystagmus, lids and conjunctivae normal ENMT: Mucous membranes are moist. Posterior pharynx not visualized. Normal dentition.  Neck: normal appearance, thick but supple Respiratory: clear to auscultation bilaterally, no wheezing, no crackles. Normal respiratory effort. No accessory muscle use.  Cardiovascular: Normal rate, regular rhythm, no murmurs / rubs / gallops. No extremity edema. 2+ pedal pulses. GI: abdomen is soft and compressible.  Mildly distended.  No tenderness.  Bowel sounds are present. Musculoskeletal:  No joint deformity in upper and lower extremities. Good ROM, no contractures. Normal muscle tone.  Skin: no rashes, skin is warm to touch but and dry, face is flushed but she is not clammy Neurologic: CN 2-12 grossly intact. Sensation intact, Strength symmetric bilaterally, 5/5.  No pronator drift.  + nystagmus  Psychiatric: Normal judgment and insight. Alert and oriented x 3.  Flat affect.    Labs on Admission: I have personally reviewed following labs and imaging studies  CBC:  Recent Labs Lab 08/15/16 1736 08/20/16 1518  WBC 10.6* 11.8*  NEUTROABS  --  9.1*  HGB 13.8 13.9  HCT 42.6 42.0  MCV 75.9* 76.4*  PLT 259 270   Basic Metabolic Panel:  Recent Labs Lab 08/15/16 1736 08/20/16 1518  NA 141 139  K 3.7 3.7  CL 102 105  CO2 24 26  GLUCOSE 121* 101*  BUN 11 10  CREATININE 0.80 0.54  CALCIUM 10.0 9.3   GFR: CrCl cannot be calculated (Unknown ideal weight.). Liver Function Tests:  Recent Labs Lab 08/15/16 1736  AST 26  ALT 29  ALKPHOS 59  BILITOT 0.6  PROT 8.2*  ALBUMIN 4.4     Recent Labs Lab 08/15/16 1736  LIPASE 13   Urine analysis:    Component Value Date/Time   COLORURINE YELLOW 11/28/2008 1927   APPEARANCEUR CLEAR 11/28/2008 1927   LABSPEC 1.025 11/28/2008 1927   PHURINE 6.5 11/28/2008 1927   GLUCOSEU NEGATIVE 11/28/2008 1927   HGBUR TRACE (A) 11/28/2008 1927   BILIRUBINUR NEGATIVE 11/28/2008 1927   KETONESUR 40 (A) 11/28/2008 1927   PROTEINUR NEGATIVE 11/28/2008 1927   UROBILINOGEN 1.0 11/28/2008 1927   NITRITE NEGATIVE 11/28/2008 1927   LEUKOCYTESUR NEGATIVE 11/28/2008 1927    Radiological Exams on Admission: Mr Laqueta Jean Wo Contrast  Result Date: 08/20/2016 CLINICAL DATA:  Dizziness, nausea, vomiting and facial numbness for 6 days. EXAM: MRI HEAD WITHOUT AND WITH CONTRAST TECHNIQUE: Multiplanar, multiecho pulse sequences of the brain and surrounding structures were obtained  without and with intravenous contrast. CONTRAST:  30mL MULTIHANCE GADOBENATE DIMEGLUMINE 529 MG/ML IV SOLN COMPARISON:  None. FINDINGS: INTRACRANIAL CONTENTS: At least 2 infratentorial white matter lesions including 9 mm RIGHT cerebellar white matter and 7 mm lesion LEFT posterior pontomedullary junction. Greater than 10 supratentorial white matter lesions predominately radiating from the periventricular white matter with low T1 signal compatible with black holes of the demyelination. Dominant 16 mm lesion RIGHT trigone. Faintly enhancing 7 mm RIGHT posterior temporal lobe lesion with T2 shine through. 7 mm LEFT frontal lobe enhancing white matter lesion. No susceptibility artifact to suggest hemorrhage. Ventricles and sulci are normal for patient's age. No midline shift, mass effect. No abnormal extra-axial fluid collections. VASCULAR: Normal major intracranial vascular flow voids present at skull base. SKULL AND UPPER CERVICAL SPINE: No abnormal sellar expansion. No suspicious calvarial bone marrow signal. Craniocervical junction maintained. SINUSES/ORBITS: The mastoid air-cells  and included paranasal sinuses are well-aerated.The included ocular globes and orbital contents are non-suspicious. Dysconjugate gaze may be transient. Examination not tailored for assessment of optic neuritis. OTHER: None. IMPRESSION: At least 2 infratentorial and greater than 10 supratentorial white matter lesions compatible with moderate chronic demyelination. Superimposed RIGHT temporal and LEFT frontal lobe acute enhancing demyelinating plaques. No parenchymal brain volume loss for age. Electronically Signed   By: Awilda Metro M.D.   On: 08/20/2016 18:23    Assessment/Plan Principal Problem:   Multiple sclerosis (HCC) Active Problems:   Facial numbness   Vertigo   Headache      Signs and symptoms concerning for acute MS --Neurology consult pending --Starting high dose steroid therapy with Solumedrol 1000mg  IV x one now in the ED --Await neurology input regarding additional testing/imaging --Analgesics and anti-emetics as needed for symptoms --S/P 1L NS in the ED.  Will hold on maintenance fluids for now.  Regular diet as tolerated.   DVT prophylaxis: SCDs Code Status: FULL Family Communication: Mother and three sisters present in the ED at time of admission. Disposition Plan: Expect she will go home at discharge but anticipate 3-5 day hospitalization per neurology. Consults called: Neurology. Admission status: Inpatient, telemetry.  I expect she will need inpatient services for greater than two midnights.   TIME SPENT: 60 minutes   Jerene Bears MD [facility] Pager [phone]  If 7PM-7AM, please contact night-coverage [email or web address] Password [facility]  08/20/2016, 8:57 PM

## 2016-08-21 LAB — URINALYSIS, ROUTINE W REFLEX MICROSCOPIC
BILIRUBIN URINE: NEGATIVE
GLUCOSE, UA: NEGATIVE mg/dL
Ketones, ur: NEGATIVE mg/dL
LEUKOCYTES UA: NEGATIVE
NITRITE: NEGATIVE
PROTEIN: 30 mg/dL — AB
Specific Gravity, Urine: 1.027 (ref 1.005–1.030)
pH: 6 (ref 5.0–8.0)

## 2016-08-21 LAB — CBC
HEMATOCRIT: 39.3 % (ref 36.0–46.0)
Hemoglobin: 12.9 g/dL (ref 12.0–15.0)
MCH: 24.8 pg — ABNORMAL LOW (ref 26.0–34.0)
MCHC: 32.8 g/dL (ref 30.0–36.0)
MCV: 75.6 fL — AB (ref 78.0–100.0)
PLATELETS: 256 10*3/uL (ref 150–400)
RBC: 5.2 MIL/uL — ABNORMAL HIGH (ref 3.87–5.11)
RDW: 14.2 % (ref 11.5–15.5)
WBC: 12.4 10*3/uL — AB (ref 4.0–10.5)

## 2016-08-21 LAB — VITAMIN B12: Vitamin B-12: 1718 pg/mL — ABNORMAL HIGH (ref 180–914)

## 2016-08-21 MED ORDER — SODIUM CHLORIDE 0.9 % IV BOLUS (SEPSIS)
500.0000 mL | Freq: Once | INTRAVENOUS | Status: AC
  Administered 2016-08-21: 500 mL via INTRAVENOUS

## 2016-08-21 MED ORDER — HYDROCODONE-ACETAMINOPHEN 5-325 MG PO TABS
1.0000 | ORAL_TABLET | Freq: Once | ORAL | Status: AC
  Administered 2016-08-21: 1 via ORAL
  Filled 2016-08-21: qty 1

## 2016-08-21 MED ORDER — SODIUM CHLORIDE 0.9 % IV SOLN
1000.0000 mg | Freq: Every day | INTRAVENOUS | Status: AC
  Administered 2016-08-21 – 2016-08-24 (×4): 1000 mg via INTRAVENOUS
  Filled 2016-08-21 (×4): qty 8

## 2016-08-21 MED ORDER — HYDRALAZINE HCL 20 MG/ML IJ SOLN
10.0000 mg | Freq: Once | INTRAMUSCULAR | Status: AC
  Administered 2016-08-21: 10 mg via INTRAVENOUS
  Filled 2016-08-21: qty 1

## 2016-08-21 MED ORDER — HYDROCODONE-ACETAMINOPHEN 5-325 MG PO TABS
1.0000 | ORAL_TABLET | ORAL | Status: DC | PRN
  Administered 2016-08-22 – 2016-08-24 (×7): 2 via ORAL
  Filled 2016-08-21 (×7): qty 2

## 2016-08-21 MED ORDER — HYDRALAZINE HCL 20 MG/ML IJ SOLN: 10.0000 mg | Freq: Four times a day (QID) | INTRAMUSCULAR | Status: DC | PRN

## 2016-08-21 MED ORDER — PANTOPRAZOLE SODIUM 40 MG IV SOLR
40.0000 mg | INTRAVENOUS | Status: DC
  Administered 2016-08-21 – 2016-08-23 (×2): 40 mg via INTRAVENOUS
  Filled 2016-08-21 (×3): qty 40

## 2016-08-21 NOTE — Progress Notes (Signed)
The accepting MD will be Dr. Konrad Dolores

## 2016-08-21 NOTE — Care Management Note (Signed)
Case Management Note  Patient Details  Name: Amanda Horne MRN: [id number] Date of Birth: [birth year]  Subjective/Objective:  31 y/o f admitted w/Multiple Sclerosis. From home. Noted for transfer to [facility]-neurology.                  Action/Plan:acute to acute transfer-[facility]   Expected Discharge Date:   (unknown)               Expected Discharge Plan:  Acute to Acute Transfer  In-House Referral:     Discharge planning Services  CM Consult  Post Acute Care Choice:    Choice offered to:     DME Arranged:    DME Agency:     HH Arranged:    HH Agency:     Status of Service:  Completed, signed off  If discussed at [employer] of Stay Meetings, dates discussed:    Additional Comments:  Lanier Clam, RN 08/21/2016, 3:44 PM

## 2016-08-21 NOTE — Progress Notes (Signed)
Patient was notified that [facility] has been notified and the patient will be transferred to [facility]. [facility]  5 C room 21

## 2016-08-21 NOTE — Progress Notes (Signed)
[facility] PROGRESS NOTE  Amanda Horne AOZ:[id number] DOB: 04-17-1986 DOA: 08/20/2016 PCP: Cassell Smiles, MD  Interim summary and HPI 31 y.o. woman with a history of varicella and herpes infections who presents to the ED for evaluation after developing progressive neurological symptoms.  She is accompanied by several family members who assist with her clinical history.  The patient presented to the ED at [facility] on Thursday 3/29 with complaints of nausea and vomiting.  She received IV fluids and zofran.  She improved.  Her diagnosis was a viral illness.  By Saturday, the family called 911 because she was having new dizziness and headache.  She was assessed at home, and reportedly transport to the ED was "refused".  She was advised to follow-up with her PCP, which she did on yesterday.  She reported persistent symptoms, and was diagnosed with vertigo.  She was given a prescription for meclizine, which she has taken approximately three times in the past 24 hours, with no improvement in her condition.  She has also taken Excedrin for her headache, without sustained relief.  Today, she developed double vision and a right gaze preference, which prompted presentation to the ED.  MRI with findings consistent with MS. Neurology requested transfer to [facility]. Started on high dose steroids.  Assessment/Plan: 1-Acute MS: newly diagnosed -continue IV solumedrol -will need PT/OT and potentially inpatient rehab -neurology on board -further work up to be decided by them -given steroids started on Protonix for GI prophylaxis  -continue supportive care and PRN analgesics for HA's -patient transferred to [facility] as per neurology recommendations  2-HTN -prior hx of elevated BP around pregnancy -off medications now -steroids most likely making BP to be elevated as well -will use PRN hydralazine  Code Status: Full Family Communication: mother and sister at bedside Disposition Plan: will transfer to [facility]  as per neurology recommendations, continue IV solumedrol.   Consultants:  Neurology   Procedures:  See below for x-ray reports   Antibiotics:  None   HPI/Subjective: Patient is complaining of HA, blurred vision and still with facial numbness. Right gaze deviation appreciated.  Objective: Vitals:   08/21/16 0557 08/21/16 1327  BP: (!) 149/87 (!) 156/101  Pulse: 89 (!) 102  Resp: 18 16  Temp: 98.2 F (36.8 C) 99.2 F (37.3 C)    Intake/Output Summary (Last 24 hours) at 08/21/16 1351 Last data filed at 08/21/16 0900  Gross per 24 hour  Intake             1560 ml  Output              950 ml  Net              610 ml   Filed Weights   08/20/16 2111  Weight: 82.8 kg (182 lb 8.7 oz)    Exam:   General:  Afebrile, no CP, no SOB. Still complaining of HA's and with right gaze deviation and blurred vision complaints. Patient also with facial numbness.   Cardiovascular: S1 and S2, no rubs, no gallops  Respiratory: CTA bilaterally   Abdomen: soft, NT, ND, positive BS  Musculoskeletal: no edema, no cyanosis   Data Reviewed: Basic Metabolic Panel:  Recent Labs Lab 08/15/16 1736 08/20/16 1518  NA 141 139  K 3.7 3.7  CL 102 105  CO2 24 26  GLUCOSE 121* 101*  BUN 11 10  CREATININE 0.80 0.54  CALCIUM 10.0 9.3   Liver Function Tests:  Recent Labs Lab 08/15/16  1736  AST 26  ALT 29  ALKPHOS 59  BILITOT 0.6  PROT 8.2*  ALBUMIN 4.4    Recent Labs Lab 08/15/16 1736  LIPASE 13   CBC:  Recent Labs Lab 08/15/16 1736 08/20/16 1518 08/21/16 0554  WBC 10.6* 11.8* 12.4*  NEUTROABS  --  9.1*  --   HGB 13.8 13.9 12.9  HCT 42.6 42.0 39.3  MCV 75.9* 76.4* 75.6*  PLT 259 270 256   Studies: Mr Laqueta Jean Wo Contrast  Result Date: 08/20/2016 CLINICAL DATA:  Dizziness, nausea, vomiting and facial numbness for 6 days. EXAM: MRI HEAD WITHOUT AND WITH CONTRAST TECHNIQUE: Multiplanar, multiecho pulse sequences of the brain and surrounding structures were  obtained without and with intravenous contrast. CONTRAST:  15mL MULTIHANCE GADOBENATE DIMEGLUMINE 529 MG/ML IV SOLN COMPARISON:  None. FINDINGS: INTRACRANIAL CONTENTS: At least 2 infratentorial white matter lesions including 9 mm RIGHT cerebellar white matter and 7 mm lesion LEFT posterior pontomedullary junction. Greater than 10 supratentorial white matter lesions predominately radiating from the periventricular white matter with low T1 signal compatible with black holes of the demyelination. Dominant 16 mm lesion RIGHT trigone. Faintly enhancing 7 mm RIGHT posterior temporal lobe lesion with T2 shine through. 7 mm LEFT frontal lobe enhancing white matter lesion. No susceptibility artifact to suggest hemorrhage. Ventricles and sulci are normal for patient's age. No midline shift, mass effect. No abnormal extra-axial fluid collections. VASCULAR: Normal major intracranial vascular flow voids present at skull base. SKULL AND UPPER CERVICAL SPINE: No abnormal sellar expansion. No suspicious calvarial bone marrow signal. Craniocervical junction maintained. SINUSES/ORBITS: The mastoid air-cells and included paranasal sinuses are well-aerated.The included ocular globes and orbital contents are non-suspicious. Dysconjugate gaze may be transient. Examination not tailored for assessment of optic neuritis. OTHER: None. IMPRESSION: At least 2 infratentorial and greater than 10 supratentorial white matter lesions compatible with moderate chronic demyelination. Superimposed RIGHT temporal and LEFT frontal lobe acute enhancing demyelinating plaques. No parenchymal brain volume loss for age. Electronically Signed   By: Awilda Metro M.D.   On: 08/20/2016 18:23    Scheduled Meds: . methylPREDNISolone (SOLU-MEDROL) injection  1,000 mg Intravenous Daily  . pantoprazole (PROTONIX) IV  40 mg Intravenous Q24H  . sodium chloride flush  3 mL Intravenous Q12H    Time spent: 25 minutes   Vassie Loll  [facility] Pager [phone]. If 7PM-7AM, please contact night-coverage at [email or web address], password [facility] 08/21/2016, 1:51 PM  LOS: 1 day

## 2016-08-21 NOTE — Progress Notes (Signed)
[facility] is in patient's room to transfer the patient to [facility]. [facility] Rm 21

## 2016-08-21 NOTE — Progress Notes (Deleted)
Dr. Clyde Lundborg will be the accepting physcian @ [facility] [place]

## 2016-08-21 NOTE — Progress Notes (Signed)
RN called Eligha Bridegroom. [facility] and gave report to [facility]. Will call [facility] shortly.

## 2016-08-21 NOTE — Progress Notes (Signed)
RN paged Amion to see who the receiving MD will be at [facility].

## 2016-08-21 NOTE — Consult Note (Signed)
NEURO HOSPITALIST CONSULT NOTE   Requestig physician: Dr. Gwenlyn Perking   Reason for Consult: MS   History obtained from:  Patient     HPI:                                                                                                                                          Amanda Horne is an 31 y.o. female who presents to the ED for evaluation after developing progressive neurological symptoms.  She is accompanied by several family members who assist with her clinical history.  The patient presented to the ED at [facility] on Thursday 3/29 with complaints of nausea and vomiting.  She received IV fluids and zofran.  She improved.  Her diagnosis was a viral illness.  By Saturday, the family called 911 because she was having new dizziness and headache.  She was assessed at home, and reportedly transport to the ED was "refused".  She was advised to follow-up with her PCP, which she did on yesterday.  She reported persistent symptoms, and was diagnosed with vertigo.  She was given a prescription for meclizine, which she has taken approximately three times in the past 24 hours, with no improvement in her condition.  She has also taken Excedrin for her headache, without sustained relief. Patient eventually developed double vision which prompted her to come back to the ED. MRI showed multiple areas of demyelination very consistent with MS. Neurology was consult.  Currently patient is not complaining of any nausea vomiting or dizziness, she is complaining of double vision, blurred vision and some decreased sensation in the right face. She has no symptoms in her upper or lower extremities. She has never had these symptoms prior. Thus far she has received 1 dose of Solu-Medrol 1 g.  Past Medical History:  Diagnosis Date  . Abnormal Pap smear   . Herpes   . Hx of varicella     Past Surgical History:  Procedure Laterality Date  . COLPOSCOPY    . NO PAST SURGERIES    . WISDOM TOOTH  EXTRACTION      Family History  Problem Relation Age of Onset  . Hypertension Mother   . Diabetes Mother   . Diabetes Sister   . Other Paternal Uncle     Wiskott Aldnch syndrome, died as a child      Social History:  reports that she has never smoked. She has never used smokeless tobacco. She reports that she does not drink alcohol or use drugs.  No Known Allergies  MEDICATIONS:  Prior to Admission:  Prescriptions Prior to Admission  Medication Sig Dispense Refill Last Dose  . aspirin-acetaminophen-caffeine (EXCEDRIN MIGRAINE) 250-250-65 MG tablet Take 1 tablet by mouth every 6 (six) hours as needed for headache.   08/20/2016 at Unknown time  . ibuprofen (ADVIL,MOTRIN) 200 MG tablet Take 800 mg by mouth every 6 (six) hours as needed (migraine).   Past Week at Unknown time  . MedroxyPROGESTERone Acetate (DEPO-PROVERA IM) Inject into the muscle every 3 (three) months.     . ondansetron (ZOFRAN ODT) 8 MG disintegrating tablet Take 1 tablet (8 mg total) by mouth every 8 (eight) hours as needed for nausea or vomiting. 20 tablet 0 08/20/2016 at Unknown time  . phentermine (ADIPEX-P) 37.5 MG tablet Take 37.5 mg by mouth daily as needed (FOR ENERGY/WEIGHT).    Past Week at Unknown time   Scheduled: . methylPREDNISolone (SOLU-MEDROL) injection  1,000 mg Intravenous Daily  . pantoprazole (PROTONIX) IV  40 mg Intravenous Q24H  . sodium chloride flush  3 mL Intravenous Q12H     ROS:                                                                                                                                       History obtained from the patient  General ROS: negative for - chills, fatigue, fever, night sweats, weight gain or weight loss Psychological ROS: negative for - behavioral disorder, hallucinations, memory difficulties, mood swings or suicidal ideation Ophthalmic ROS:  Positive for - blurry vision, double vision,  ENT ROS: negative for - epistaxis, nasal discharge, oral lesions, sore throat, tinnitus or vertigo Allergy and Immunology ROS: negative for - hives or itchy/watery eyes Hematological and Lymphatic ROS: negative for - bleeding problems, bruising or swollen lymph nodes Endocrine ROS: negative for - galactorrhea, hair pattern changes, polydipsia/polyuria or temperature intolerance Respiratory ROS: negative for - cough, hemoptysis, shortness of breath or wheezing Cardiovascular ROS: negative for - chest pain, dyspnea on exertion, edema or irregular heartbeat Gastrointestinal ROS: negative for - abdominal pain, diarrhea, hematemesis, nausea/vomiting or stool incontinence Genito-Urinary ROS: negative for - dysuria, hematuria, incontinence or urinary frequency/urgency Musculoskeletal ROS: negative for - joint swelling or muscular weakness Neurological ROS: as noted in HPI Dermatological ROS: negative for rash and skin lesion changes   Blood pressure (!) 149/87, pulse 89, temperature 98.2 F (36.8 C), temperature source Oral, resp. rate 18, height 5\' 3"  (1.6 m), weight 82.8 kg (182 lb 8.7 oz), SpO2 93 %, currently breastfeeding.   Neurologic Examination:  HEENT-  Normocephalic, no lesions, without obvious abnormality.  Normal external eye and conjunctiva.  Normal TM's bilaterally.  Normal auditory canals and external ears. Normal external nose, mucus membranes and septum.  Normal pharynx. Cardiovascular- S1, S2 normal, pulses palpable throughout   Lungs- chest clear, no wheezing, rales, normal symmetric air entry Abdomen- normal findings: bowel sounds normal Extremities- no edema Lymph-no adenopathy palpable Musculoskeletal-no joint tenderness, deformity or swelling Skin-warm and dry, no hyperpigmentation, vitiligo, or suspicious lesions  Neurological  Examination Mental Status: Alert, oriented, thought content appropriate.  Speech fluent without evidence of aphasia.  Able to follow 3 step commands without difficulty. Cranial Nerves: II: Discs flat bilaterally; Visual fields grossly normal, pupils equal, round, reactive to light and accommodation III,IV, VI: ptosis not present, when looking straight forward her right eye is deviated laterally, her left eye look straightforward but has nystagmus with the fast phase to the right and slow phase to the left. When looking to the right both eyes move fully to the right but have significant nystagmus with the fast phase to the right slow phase the left, when looking to the left left eye deviates fully to the lateral aspect the right eye has difficulty getting fully over but does cross midline and patient shows significant nystagmus to the left. V,VII: smile symmetric, facial light touch sensation decreased in the right cheek VIII: hearing normal bilaterally IX,X: uvula rises symmetrically XI: bilateral shoulder shrug XII: midline tongue extension Motor: Right : Upper extremity   5/5    Left:     Upper extremity   5/5  Lower extremity   5/5     Lower extremity   5/5 Tone and bulk:normal tone throughout; no atrophy noted Sensory: Pinprick and light touch intact throughout, bilaterally Deep Tendon Reflexes: 2+ and symmetric throughout Plantars: Right: downgoing   Left: downgoing Cerebellar: normal finger-to-nose, normal rapid alternating movements and normal heel-to-shin test Gait: Not tested      Lab Results: Basic Metabolic Panel:  Recent Labs Lab 08/15/16 1736 08/20/16 1518  NA 141 139  K 3.7 3.7  CL 102 105  CO2 24 26  GLUCOSE 121* 101*  BUN 11 10  CREATININE 0.80 0.54  CALCIUM 10.0 9.3    Liver Function Tests:  Recent Labs Lab 08/15/16 1736  AST 26  ALT 29  ALKPHOS 59  BILITOT 0.6  PROT 8.2*  ALBUMIN 4.4    Recent Labs Lab 08/15/16 1736  LIPASE 13   No  results for input(s): AMMONIA in the last 168 hours.  CBC:  Recent Labs Lab 08/15/16 1736 08/20/16 1518 08/21/16 0554  WBC 10.6* 11.8* 12.4*  NEUTROABS  --  9.1*  --   HGB 13.8 13.9 12.9  HCT 42.6 42.0 39.3  MCV 75.9* 76.4* 75.6*  PLT 259 270 256    Cardiac Enzymes: No results for input(s): CKTOTAL, CKMB, CKMBINDEX, TROPONINI in the last 168 hours.  Lipid Panel: No results for input(s): CHOL, TRIG, HDL, CHOLHDL, VLDL, LDLCALC in the last 168 hours.  CBG: No results for input(s): GLUCAP in the last 168 hours.  Microbiology: Results for orders placed or performed in visit on 03/29/13  OB RESULT CONSOLE Group B Strep     Status: None   Collection Time: 03/08/13 12:00 AM  Result Value Ref Range Status   GBS Negative  Final    Coagulation Studies: No results for input(s): LABPROT, INR in the last 72 hours.  Imaging: Mr Laqueta Jean VO Contrast  Result Date: 08/20/2016 CLINICAL  DATA:  Dizziness, nausea, vomiting and facial numbness for 6 days. EXAM: MRI HEAD WITHOUT AND WITH CONTRAST TECHNIQUE: Multiplanar, multiecho pulse sequences of the brain and surrounding structures were obtained without and with intravenous contrast. CONTRAST:  15mL MULTIHANCE GADOBENATE DIMEGLUMINE 529 MG/ML IV SOLN COMPARISON:  None. FINDINGS: INTRACRANIAL CONTENTS: At least 2 infratentorial white matter lesions including 9 mm RIGHT cerebellar white matter and 7 mm lesion LEFT posterior pontomedullary junction. Greater than 10 supratentorial white matter lesions predominately radiating from the periventricular white matter with low T1 signal compatible with black holes of the demyelination. Dominant 16 mm lesion RIGHT trigone. Faintly enhancing 7 mm RIGHT posterior temporal lobe lesion with T2 shine through. 7 mm LEFT frontal lobe enhancing white matter lesion. No susceptibility artifact to suggest hemorrhage. Ventricles and sulci are normal for patient's age. No midline shift, mass effect. No abnormal  extra-axial fluid collections. VASCULAR: Normal major intracranial vascular flow voids present at skull base. SKULL AND UPPER CERVICAL SPINE: No abnormal sellar expansion. No suspicious calvarial bone marrow signal. Craniocervical junction maintained. SINUSES/ORBITS: The mastoid air-cells and included paranasal sinuses are well-aerated.The included ocular globes and orbital contents are non-suspicious. Dysconjugate gaze may be transient. Examination not tailored for assessment of optic neuritis. OTHER: None. IMPRESSION: At least 2 infratentorial and greater than 10 supratentorial white matter lesions compatible with moderate chronic demyelination. Superimposed RIGHT temporal and LEFT frontal lobe acute enhancing demyelinating plaques. No parenchymal brain volume loss for age. Electronically Signed   By: Awilda Metro M.D.   On: 08/20/2016 18:23       Assessment and plan per attending neurologist  Felicie Morn PA-C [facility] [phone]  08/21/2016, 8:59 AM   Assessment/Plan: This is a 31 year old female with MRI findings very consistent of MS. Physical exam is positive for above. At this point patient has been started on Solu-Medrol 1 g to be done daily along with Solu-Medrol 40 mg IV daily forgot protection. Patient will need PT, OT and possible inpatient rehabilitation. She will definitely need an outpatient neurology follow-up.

## 2016-08-22 DIAGNOSIS — R200 Anesthesia of skin: Secondary | ICD-10-CM

## 2016-08-22 LAB — CBC WITH DIFFERENTIAL/PLATELET
BASOS PCT: 0 %
Basophils Absolute: 0 10*3/uL (ref 0.0–0.1)
EOS ABS: 0 10*3/uL (ref 0.0–0.7)
Eosinophils Relative: 0 %
HEMATOCRIT: 38.8 % (ref 36.0–46.0)
HEMOGLOBIN: 12.7 g/dL (ref 12.0–15.0)
Lymphocytes Relative: 11 %
Lymphs Abs: 2.4 10*3/uL (ref 0.7–4.0)
MCH: 24.8 pg — AB (ref 26.0–34.0)
MCHC: 32.7 g/dL (ref 30.0–36.0)
MCV: 75.6 fL — ABNORMAL LOW (ref 78.0–100.0)
MONOS PCT: 7 %
Monocytes Absolute: 1.4 10*3/uL — ABNORMAL HIGH (ref 0.1–1.0)
Neutro Abs: 18.1 10*3/uL — ABNORMAL HIGH (ref 1.7–7.7)
Neutrophils Relative %: 82 %
Platelets: 249 10*3/uL (ref 150–400)
RBC: 5.13 MIL/uL — AB (ref 3.87–5.11)
RDW: 14.4 % (ref 11.5–15.5)
WBC: 21.9 10*3/uL — ABNORMAL HIGH (ref 4.0–10.5)

## 2016-08-22 LAB — BASIC METABOLIC PANEL
Anion gap: 9 (ref 5–15)
BUN: 15 mg/dL (ref 6–20)
CALCIUM: 9.2 mg/dL (ref 8.9–10.3)
CO2: 22 mmol/L (ref 22–32)
CREATININE: 0.63 mg/dL (ref 0.44–1.00)
Chloride: 108 mmol/L (ref 101–111)
GFR calc Af Amer: >60 mL/min (ref >60)
GFR calc non Af Amer: >60 mL/min (ref >60)
GLUCOSE: 155 mg/dL — AB (ref 65–99)
Potassium: 3.7 mmol/L (ref 3.5–5.1)
Sodium: 139 mmol/L (ref 135–145)

## 2016-08-22 LAB — ANA W/REFLEX IF POSITIVE: Anti Nuclear Antibody(ANA): NEGATIVE

## 2016-08-22 LAB — HIV ANTIBODY (ROUTINE TESTING W REFLEX): HIV SCREEN 4TH GENERATION: NONREACTIVE

## 2016-08-22 LAB — MAGNESIUM: MAGNESIUM: 2.2 mg/dL (ref 1.7–2.4)

## 2016-08-22 LAB — ANTI-SCLERODERMA ANTIBODY

## 2016-08-22 LAB — ANGIOTENSIN CONVERTING ENZYME: ANGIOTENSIN-CONVERTING ENZYME: 21 U/L (ref 14–82)

## 2016-08-22 MED ORDER — KETOROLAC TROMETHAMINE 30 MG/ML IJ SOLN
30.0000 mg | Freq: Once | INTRAMUSCULAR | Status: AC
Start: 2016-08-22 — End: 2016-08-22
  Administered 2016-08-22: 30 mg via INTRAVENOUS
  Filled 2016-08-22: qty 1

## 2016-08-22 MED ORDER — SENNA 8.6 MG PO TABS
2.0000 | ORAL_TABLET | Freq: Every evening | ORAL | Status: DC | PRN
  Administered 2016-08-22: 17.2 mg via ORAL
  Filled 2016-08-22: qty 2

## 2016-08-22 MED ORDER — POLYETHYLENE GLYCOL 3350 17 G PO PACK
17.0000 g | PACK | Freq: Every day | ORAL | Status: DC
  Administered 2016-08-22 – 2016-08-24 (×4): 17 g via ORAL
  Filled 2016-08-22 (×4): qty 1

## 2016-08-22 MED ORDER — DIPHENHYDRAMINE HCL 50 MG/ML IJ SOLN
25.0000 mg | Freq: Once | INTRAMUSCULAR | Status: AC
  Administered 2016-08-22: 25 mg via INTRAVENOUS
  Filled 2016-08-22: qty 1

## 2016-08-22 MED ORDER — METOCLOPRAMIDE HCL 5 MG/ML IJ SOLN
10.0000 mg | Freq: Once | INTRAMUSCULAR | Status: AC
  Administered 2016-08-22: 10 mg via INTRAVENOUS
  Filled 2016-08-22: qty 2

## 2016-08-22 NOTE — Evaluation (Signed)
Occupational Therapy Evaluation Patient Details Name: Amanda Horne MRN: [id number] DOB: 1986/01/20 Today's Date: 08/22/2016    History of Present Illness Pt is a 31 y/o female who was diagnosed with MS on 08/21/16. She presented to the ED on 08/21/16 complaining of double vision, blurred vision and some decreased sensation in the right face. Pt has no pertinent medical history on file. MRI revealed This shows several scattered lesions in the bihemispheric white matter, the majority of which are periventricular in location. There are 2 lesions in the posterior fossa, one in the right cerebellum and one in the left pontomedullary junction. Two lesions enhance, one in the posterior right temporal lobe and one in the left frontal lobe. 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   Clinical Impression   PTA Pt independent in ADL/IADL working, driving, mother of 3 kids, working on [employer]. Pt currently set up-mod A for ADL and min A for ambulation with RW. Pt is ataxic in BUE and trunk (refer to Pt for BLE). Pt also presenting with severe change in vision (please see visual assessment below). Pt provided with contact information for MS [facility] as a potential information source and support group with new diagnosis. Pt will benefit from skilled OT in the acute setting and will require CIR level therapy to maximize safety and independence in ADL and functional transfers to return to PLOF. Next session to re-assess taping (occlusion) of glasses addressing diplopia and work on standing balance for grooming/bathing as well as begin energy conservation education.     Follow Up Recommendations  CIR;Supervision/Assistance - 24 hour    Equipment Recommendations  Other (comment) (defer to next venue)    Recommendations for Other Services Rehab consult     Precautions / Restrictions Precautions Precautions: Fall Precaution Comments: pt denies any falls or mobilty issues in past.   She says she has always been clumsy Restrictions Weight Bearing Restrictions: No      Mobility Bed Mobility Overal bed mobility: Modified Independent                Transfers Overall transfer level: Needs assistance Equipment used: Rolling walker (2 wheeled) Transfers: Sit to/from Stand Sit to Stand: Min assist         General transfer comment: good carryover for safe hand placement from previous PT session. Pt needed min A initially upon standing due to ataxia in trunk    Balance Overall balance assessment: Needs assistance Sitting-balance support: Single extremity supported;Bilateral upper extremity supported;Feet supported Sitting balance-Leahy Scale: Good Sitting balance - Comments: able to sit EOB with no back support   Standing balance support: Bilateral upper extremity supported;During functional activity Standing balance-Leahy Scale: Poor Standing balance comment: Pt reliant on RW for balance during standing and mobility                           ADL either performed or assessed with clinical judgement   ADL Overall ADL's : Needs assistance/impaired Eating/Feeding: Set up;Sitting   Grooming: Standing;Minimal assistance Grooming Details (indicate cue type and reason): min A for standing balance - when Pt removes hands from RW, balance decreases rapidly Upper Body Bathing: Moderate assistance   Lower Body Bathing: Minimal assistance;Sitting/lateral leans   Upper Body Dressing : Set up;Sitting   Lower Body Dressing: Sit to/from stand;Moderate assistance Lower Body Dressing Details (indicate cue type and reason): Pt able to don socks by crossing legs and bringing feet up, with  sit to stand requires assist for balance Toilet Transfer: Minimal assistance;Ambulation;Comfort height toilet;Grab bars;RW   Toileting- Clothing Manipulation and Hygiene: Minimal assistance;Sit to/from stand   Tub/ Engineer, structural: Moderate assistance   Functional  mobility during ADLs: Minimal assistance;Cueing for sequencing;Cueing for safety;Rolling walker General ADL Comments: vc for safety this session; Pt unfamiliar with DME and compensatory strategies     Vision Baseline Vision/History: Wears glasses Wears Glasses: At all times Patient Visual Report: Blurring of vision;Diplopia Vision Assessment?: Yes 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 Janee Morn to proceed with occlusion of right eye. Tape applied to right lens of Pt's glasses so that peripheral vision, light, and depth perception are still available input to eye. Pt reported immediate relief with taping of glasses; she reports better balance and stability (mother and BFF agree that she is better moving around the room)     Perception     Praxis      Pertinent Vitals/Pain Pain Assessment: No/denies pain     Hand Dominance Left   Extremity/Trunk Assessment Upper Extremity Assessment Upper Extremity Assessment: Generalized weakness (ataxic)   Lower Extremity Assessment Lower Extremity Assessment: Defer to PT evaluation   Cervical / Trunk Assessment Cervical / Trunk Assessment: Other exceptions (ataxic) Cervical / Trunk Exceptions: ataxic   Communication Communication Communication: No difficulties   Cognition Arousal/Alertness: Awake/alert Behavior During Therapy: WFL for tasks assessed/performed Overall Cognitive Status: Within Functional Limits for tasks assessed                                 General Comments: Pt tearful  when thinking about everything she has going on in her life. emotional support provided.   General Comments  Pt's mother and best friend present for session    Exercises     Shoulder Instructions      Home Living Family/patient expects to be discharged to:: Private residence Living Arrangements: Alone;Children (5, 3, and 5 year old) Available Help at Discharge: Family;Friend(s) Type of Home: House Home Access: Stairs to enter     Home Layout: Two level;Bed/bath upstairs;Other (Comment) (Pt can make a bedroom downstairs if she needs to) Alternate Level Stairs-Number of Steps: flight   Bathroom Shower/Tub: Chief Strategy Officer: Standard Bathroom Accessibility: Yes How Accessible: Accessible via walker Home Equipment: None          Prior Functioning/Environment Level of Independence: Independent        Comments: pt independent.  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; driving; doing classes to get her doctorate in healthcare administration        OT Problem List: Decreased activity tolerance;Impaired balance (sitting and/or standing);Impaired vision/perception;Decreased coordination;Decreased knowledge of use of DME or AE      OT Treatment/Interventions: Self-care/ADL training;Neuromuscular education;DME and/or AE instruction;Energy conservation;Therapeutic activities;Visual/perceptual remediation/compensation;Patient/family education;Balance training    OT Goals(Current goals can be found in the care plan section) Acute Rehab OT Goals Patient Stated Goal: to get back home OT Goal Formulation: With patient Time For Goal Achievement: 09/05/16 Potential to Achieve Goals: Good ADL Goals Pt Will Perform Upper Body Bathing: with  modified independence;standing Pt Will Perform Lower Body Bathing: with modified independence;sit to/from stand Pt Will Transfer to Toilet: regular height toilet;ambulating;with supervision (with RW) Pt  Will Perform Toileting - Clothing Manipulation and hygiene: with modified independence;sit to/from stand Pt Will Perform Tub/Shower Transfer: Tub transfer;with supervision;with caregiver independent in assisting;ambulating;3 in 1 Additional ADL Goal #1: Pt will recall 3 ways to conserve energy during ADL/IADL with no verbal cues  OT Frequency: Min 3X/week   Barriers to D/C:            Co-evaluation              End of Session Equipment Utilized During Treatment: Gait belt;Rolling walker;Other (comment) (glasses with occlusion on right eye) Nurse Communication: Mobility status  Activity Tolerance: Patient tolerated treatment well Patient left: in bed;with call bell/phone within reach;with family/visitor present  OT Visit Diagnosis: Unsteadiness on feet (R26.81);Muscle weakness (generalized) (M62.81);Other symptoms and signs involving the nervous system (R29.898)                Time: 1610-9604 OT Time Calculation (min): 35 min Charges:  OT General Charges $OT Visit: 1 Procedure OT Evaluation $OT Eval Moderate Complexity: 1 Procedure OT Treatments $Self Care/Home Management : 8-22 mins G-Codes:    Sherryl Manges OTR/L [phone] Evern Bio Violet [place] 08/22/2016, 5:45 PM

## 2016-08-22 NOTE — Consult Note (Signed)
Physical Medicine and Rehabilitation Consult   Reason for Consult: New diagnosis of MS Referring Physician: Dr. Janee Morn  HPI: Amanda Horne is a 31 y.o. female with diagnosed with recent viral illness 3/29 and admitted via ED with HA, dizziness, N/V, tingling right face and double vision. MRI brain done revealing acute enhancing lesion right temporal and left frontal lobe with multiple white matter lesions compatible with moderate chronic demyelination.  She was started on solumedrol for MS exacerbation and has had improvement in symptoms. Neurology recommended 5 day course of steriods and follow up with Dr. Epimenio Foot for MS management.    Review of Systems  HENT: Negative for hearing loss and tinnitus.   Eyes: Positive for double vision. Negative for blurred vision.  Respiratory: Negative for cough and shortness of breath.   Cardiovascular: Negative for chest pain, palpitations and leg swelling.  Gastrointestinal: Negative for constipation, heartburn and nausea.  Genitourinary: Negative for dysuria and urgency.  Musculoskeletal: Negative for back pain, joint pain and myalgias.  Neurological: Positive for sensory change (RUE new since yesterday) and weakness. Negative for dizziness, tremors, focal weakness and headaches.  Psychiatric/Behavioral: Negative for memory loss. The patient is not nervous/anxious.       Past Medical History:  Diagnosis Date  . Abnormal Pap smear   . Herpes   . Hx of varicella     Past Surgical History:  Procedure Laterality Date  . COLPOSCOPY    . NO PAST SURGERIES    . WISDOM TOOTH EXTRACTION      Family History  Problem Relation Age of Onset  . Hypertension Mother   . Diabetes Mother   . Diabetes Sister   . Other Paternal Uncle     Wiskott Aldnch syndrome, died as a child     Social History:  Single. Works as a Engineer, civil (consulting) for [employer]. Lives with children--mother and sister live in the neighborhood and can provide assistance after discharge. She  reports that she has never smoked. She has never used smokeless tobacco. Uses alcohol couple of times a month.  She reports that she does not or use drugs.    Allergies: No Known Allergies    Medications Prior to Admission  Medication Sig Dispense Refill  . aspirin-acetaminophen-caffeine (EXCEDRIN MIGRAINE) 250-250-65 MG tablet Take 1 tablet by mouth every 6 (six) hours as needed for headache.    . ibuprofen (ADVIL,MOTRIN) 200 MG tablet Take 800 mg by mouth every 6 (six) hours as needed (migraine).    . MedroxyPROGESTERone Acetate (DEPO-PROVERA IM) Inject into the muscle every 3 (three) months.    . ondansetron (ZOFRAN ODT) 8 MG disintegrating tablet Take 1 tablet (8 mg total) by mouth every 8 (eight) hours as needed for nausea or vomiting. 20 tablet 0  . phentermine (ADIPEX-P) 37.5 MG tablet Take 37.5 mg by mouth daily as needed (FOR ENERGY/WEIGHT).       Home: Home Living Family/patient expects to be discharged to:: Private residence Living Arrangements: Alone, Children (64, 57, and 7 year old) Available Help at Discharge: Family, Friend(s) Type of Home: House Home Access: Stairs to enter Home Layout: Two level, Bed/bath upstairs, Other (Comment) (Pt can make a bedroom downstairs if she needs to) Alternate Level Stairs-Number of Steps: flight Bathroom Shower/Tub: Engineer, manufacturing systems: Standard Bathroom Accessibility: Yes Home Equipment: None  Functional History: Prior Function Level of Independence: Independent Comments: pt independent.  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 Pt needed min A initially upon standing due to ataxia in trunk Ambulation/Gait Ambulation/Gait assistance: Min assist Ambulation Distance (Feet): 30 Feet Assistive device: Rolling walker (2 wheeled) General Gait Details: pt unsteady - used RW and cued to stand with wide base when walking.  will need to further assess if due to legs or mostly vision.  pt seeing objects in front of her -had to give her verbal cuing to know her path is clear, to turn etc.  Pt did better with wide gait - occiasionally wanting to adduct legs and loose balance - esp with turning.  pt needed moderate verbal cuing for all mobilty..  pt walked to door and back adn then walked into the bathroom and to sink to wash her hands.      ADL: ADL Overall ADL's : Needs assistance/impaired Eating/Feeding: Set up, Sitting Grooming: Standing, Minimal assistance Grooming Details (indicate cue type and reason): min A for standing balance - when Pt removes hands from RW, balance decreases rapidly Upper Body Bathing: Moderate assistance Lower Body Bathing: Minimal assistance, Sitting/lateral leans Upper Body Dressing : Set up, Sitting Lower Body Dressing: Sit to/from stand, Moderate assistance Lower Body Dressing Details (indicate cue type and reason): Pt able to don socks by crossing legs and bringing feet up, with sit to stand requires assist for balance Toilet Transfer: Minimal assistance, Ambulation, Comfort height toilet, Grab bars, RW Toileting- Clothing Manipulation and Hygiene: Minimal assistance, Sit to/from stand Tub/ Shower Transfer: Moderate assistance Functional mobility during ADLs: Minimal assistance, Cueing for sequencing, Cueing for safety, Rolling walker General ADL Comments: vc for safety this session; Pt unfamiliar with DME and compensatory strategies  Cognition: Cognition Overall Cognitive Status: Within Functional Limits for tasks assessed Orientation Level: Oriented  X4 Cognition Arousal/Alertness: Awake/alert Behavior During Therapy: WFL for tasks assessed/performed Overall Cognitive Status: Within Functional Limits for tasks assessed General Comments: Pt tearful when thinking about everything she has going on in her life. emotional support provided.  Blood pressure 129/84, pulse 85, temperature 98.7 F (37.1 C), temperature source Oral, resp. rate 20, height 5\' 3"  (1.6 m), weight 87.4 kg (192 lb 10.9 oz), SpO2 96 %, currently breastfeeding. Physical Exam  Nursing note and vitals reviewed. Constitutional: She is oriented to person, place, and time. She appears well-developed and well-nourished.  Flushed appearing  HENT:  Head: Normocephalic and atraumatic.  Eyes: Pupils are equal, round, and reactive to light. Right eye exhibits no discharge. Left eye exhibits no discharge.  Neck: Normal range of motion. Neck supple.  Cardiovascular: Normal rate and regular rhythm.   Respiratory: Effort normal and breath sounds normal. No stridor. No respiratory distress. She has no wheezes.  GI: Soft. Bowel sounds are normal. She exhibits no distension. There is no tenderness.  Neurological: She is alert and oriented to person, place, and time.  Dysconjugate gaze, right eye drifts laterally, NMO. Cognitively with good insight and awareness. Motor nearly 5/5 in all 4 limbs. No sensory deficits. Normal ROM  Skin: Skin is warm and dry.    Results for orders placed or performed during the hospital encounter of 08/20/16 (from the past 24 hour(s))  Basic metabolic panel     Status: Abnormal  Collection Time: 08/22/16  8:59 AM  Result Value Ref Range   Sodium 139 135 - 145 mmol/L   Potassium 3.7 3.5 - 5.1 mmol/L   Chloride 108 101 - 111 mmol/L   CO2 22 22 - 32 mmol/L   Glucose, Bld 155 (H) 65 - 99 mg/dL   BUN 15 6 - 20 mg/dL   Creatinine, Ser 1.61 0.44 - 1.00 mg/dL   Calcium 9.2 8.9 - 09.6 mg/dL   GFR calc non Af Amer >60 >60 mL/min   GFR calc Af Amer >60 >60  mL/min   Anion gap 9 5 - 15  CBC with Differential/Platelet     Status: Abnormal   Collection Time: 08/22/16  8:59 AM  Result Value Ref Range   WBC 21.9 (H) 4.0 - 10.5 K/uL   RBC 5.13 (H) 3.87 - 5.11 MIL/uL   Hemoglobin 12.7 12.0 - 15.0 g/dL   HCT 04.5 40.9 - 81.1 %   MCV 75.6 (L) 78.0 - 100.0 fL   MCH 24.8 (L) 26.0 - 34.0 pg   MCHC 32.7 30.0 - 36.0 g/dL   RDW 91.4 78.2 - 95.6 %   Platelets 249 150 - 400 K/uL   Neutrophils Relative % 82 %   Neutro Abs 18.1 (H) 1.7 - 7.7 K/uL   Lymphocytes Relative 11 %   Lymphs Abs 2.4 0.7 - 4.0 K/uL   Monocytes Relative 7 %   Monocytes Absolute 1.4 (H) 0.1 - 1.0 K/uL   Eosinophils Relative 0 %   Eosinophils Absolute 0.0 0.0 - 0.7 K/uL   Basophils Relative 0 %   Basophils Absolute 0.0 0.0 - 0.1 K/uL  Magnesium     Status: None   Collection Time: 08/22/16  8:59 AM  Result Value Ref Range   Magnesium 2.2 1.7 - 2.4 mg/dL  CBC with Differential/Platelet     Status: Abnormal   Collection Time: 08/23/16  3:09 AM  Result Value Ref Range   WBC 15.3 (H) 4.0 - 10.5 K/uL   RBC 4.87 3.87 - 5.11 MIL/uL   Hemoglobin 11.8 (L) 12.0 - 15.0 g/dL   HCT 21.3 08.6 - 57.8 %   MCV 75.8 (L) 78.0 - 100.0 fL   MCH 24.2 (L) 26.0 - 34.0 pg   MCHC 32.0 30.0 - 36.0 g/dL   RDW 46.9 62.9 - 52.8 %   Platelets 244 150 - 400 K/uL   Neutrophils Relative % 79 %   Neutro Abs 12.0 (H) 1.7 - 7.7 K/uL   Lymphocytes Relative 12 %   Lymphs Abs 1.9 0.7 - 4.0 K/uL   Monocytes Relative 9 %   Monocytes Absolute 1.4 (H) 0.1 - 1.0 K/uL   Eosinophils Relative 0 %   Eosinophils Absolute 0.0 0.0 - 0.7 K/uL   Basophils Relative 0 %   Basophils Absolute 0.0 0.0 - 0.1 K/uL  Basic metabolic panel     Status: Abnormal   Collection Time: 08/23/16  3:09 AM  Result Value Ref Range   Sodium 140 135 - 145 mmol/L   Potassium 4.2 3.5 - 5.1 mmol/L   Chloride 106 101 - 111 mmol/L   CO2 24 22 - 32 mmol/L   Glucose, Bld 123 (H) 65 - 99 mg/dL   BUN 17 6 - 20 mg/dL   Creatinine, Ser 4.13 0.44  - 1.00 mg/dL   Calcium 9.0 8.9 - 24.4 mg/dL   GFR calc non Af Amer >60 >60 mL/min   GFR calc Af Amer >60 >60 mL/min  Anion gap 10 5 - 15  Magnesium     Status: None   Collection Time: 08/23/16  3:09 AM  Result Value Ref Range   Magnesium 2.4 1.7 - 2.4 mg/dL   No results found.  Assessment/Plan: Diagnosis: Newly diagnosed MS. Responding to steroids 1. Does the need for close, 24 hr/day medical supervision in concert with the patient's rehab needs make it unreasonable for this patient to be served in a less intensive setting? No 2. Co-Morbidities requiring supervision/potential complications: n/a 3. Due to bladder management, bowel management and disease management, does the patient require 24 hr/day rehab nursing? No 4. Does the patient require coordinated care of a physician, rehab nurse, PT, OT to address physical and functional deficits in the context of the above medical diagnosis(es)? No Addressing deficits in the following areas: balance, endurance, locomotion, strength, transferring and bowel/bladder control 5. Can the patient actively participate in an intensive therapy program of at least 3 hrs of therapy per day at least 5 days per week? Potentially 6. The potential for patient to make measurable gains while on inpatient rehab is fair 7. Anticipated functional outcomes upon discharge from inpatient rehab are n/a  with PT, n/a with OT, n/a with SLP. 8. Estimated rehab length of stay to reach the above functional goals is: n/a 9. Does the patient have adequate social supports and living environment to accommodate these discharge functional goals? Yes 10. Anticipated D/C setting: Home 11. Anticipated post D/C treatments: Outpatient therapy 12. Overall Rehab/Functional Prognosis: good  RECOMMENDATIONS: This patient's condition is appropriate for continued rehabilitative care in the following setting: Outpatient Therapy Patient has agreed to participate in recommended program.  Yes Note that insurance prior authorization may be required for reimbursement for recommended care.  Comment: Pt is improving clinically. Recommend discharge to home when medically ready and referral to [facility] for PT.  Ranelle Oyster, MD, [facility] Physical Medicine & Rehabilitation 08/23/2016    Jacquelynn Cree, PA-C 08/23/2016

## 2016-08-22 NOTE — Progress Notes (Signed)
PROGRESS NOTE    Amanda Horne  ZOX:[id number] DOB: 1985/11/08 DOA: 08/20/2016 PCP: Cassell Smiles, MD    Brief Narrative:  31 y.o.woman with a history of varicella and herpes infections who presented to the ED for evaluation after developing progressive neurological symptoms. She is accompanied by several family members who assist with her clinical history. The patient presented to the ED at [facility] on Thursday 3/29 with complaints of nausea and vomiting. She received IV fluids and zofran. She improved. Her diagnosis was a viral illness. By Saturday, the family called 911 because she was having new dizziness and headache. She was assessed at home, and reportedly transport to the ED was "refused". She was advised to follow-up with her PCP, which she did on yesterday. She reported persistent symptoms, and was diagnosed with vertigo. She was given a prescription for meclizine, which she has taken approximately three times in the past 24 hours, with no improvement in her condition. She has also taken Excedrin for her headache, without sustained relief. Today, she developed double vision and a right gaze preference, which prompted presentation to the ED.  MRI with findings consistent with MS. Neurology requested transfer to [facility]. Started on high dose steroids.    Assessment & Plan:   Principal Problem:   Multiple sclerosis (HCC) Active Problems:   Facial numbness   Vertigo   Headache   Double vision  1-Acute MS/ right internuclear ophthalmoplegia/right facial numbness:  --newly diagnosed -continue IV solumedrol 1000 mg daily times a total of 5 days. -will need PT/OT and potentially inpatient rehab -neurology on board - ANA, and an more antibodies, anti-scleroderma antibodies ordered and currently pending. -further work up to be decided by them -given steroids started on Protonix for GI prophylaxis  -continue supportive care and PRN analgesics for HA's -patient  transferred to [facility] as per neurology recommendations. - Patient will likely need outpatient follow-up with neurology, Dr. Epimenio Foot for long-term management of MS.  2-HTN -prior hx of elevated BP around pregnancy -off medications now -steroids most likely making BP to be elevated as well -will use PRN hydralazine  3. Headache Likely related to problem #1. Symptomatic care.   DVT prophylaxis: SCDs Code Status: Full Family Communication: Updated patient and family at bedside. Disposition Plan: Home versus CIR was clinically improved and completed high-dose pulse steroids and per neurology.   Consultants:   Neurology: Dr.Oster 08/21/2016  Procedures:   MRI brain 08/20/2016  Antimicrobials:   None   Subjective: Patient complaining of headache. Patient also complaining of double vision. Patient states some improvement.  Objective: Vitals:   08/21/16 2321 08/22/16 0022 08/22/16 0609 08/22/16 0952  BP: (!) 147/91 136/85 129/78 (!) 141/93  Pulse: (!) 114 (!) 105 96 (!) 106  Resp: 18 18 18 19   Temp: 98.6 F (37 C) 98.7 F (37.1 C) 98.8 F (37.1 C) 98.6 F (37 C)  TempSrc: Oral Oral Oral Oral  SpO2: 97% 95% 97% 95%  Weight:  87.4 kg (192 lb 10.9 oz)    Height:  5\' 3"  (1.6 m)      Intake/Output Summary (Last 24 hours) at 08/22/16 1148 Last data filed at 08/22/16 0606  Gross per 24 hour  Intake              418 ml  Output              300 ml  Net              118 ml  Filed Weights   08/20/16 2111 08/22/16 0022  Weight: 82.8 kg (182 lb 8.7 oz) 87.4 kg (192 lb 10.9 oz)    Examination:  General exam: Appears calm and comfortable  Respiratory system: Clear to auscultation. Respiratory effort normal. Cardiovascular system: S1 & S2 heard, RRR. No JVD, murmurs, rubs, gallops or clicks. No pedal edema. Gastrointestinal system: Abdomen is nondistended, soft and nontender. No organomegaly or masses felt. Normal bowel sounds heard. Central nervous system: Alert and oriented.  No focal neurological deficits. Extremities: Symmetric 5 x 5 power. Skin: No rashes, lesions or ulcers Psychiatry: Judgement and insight appear normal. Mood & affect appropriate.     Data Reviewed: I have personally reviewed following labs and imaging studies  CBC:  Recent Labs Lab 08/15/16 1736 08/20/16 1518 08/21/16 0554 08/22/16 0859  WBC 10.6* 11.8* 12.4* 21.9*  NEUTROABS  --  9.1*  --  18.1*  HGB 13.8 13.9 12.9 12.7  HCT 42.6 42.0 39.3 38.8  MCV 75.9* 76.4* 75.6* 75.6*  PLT 259 270 256 249   Basic Metabolic Panel:  Recent Labs Lab 08/15/16 1736 08/20/16 1518 08/22/16 0859  NA 141 139 139  K 3.7 3.7 3.7  CL 102 105 108  CO2 24 26 22   GLUCOSE 121* 101* 155*  BUN 11 10 15   CREATININE 0.80 0.54 0.63  CALCIUM 10.0 9.3 9.2  MG  --   --  2.2   GFR: Estimated Creatinine Clearance: 105.8 mL/min (by C-G formula based on SCr of 0.63 mg/dL). Liver Function Tests:  Recent Labs Lab 08/15/16 1736  AST 26  ALT 29  ALKPHOS 59  BILITOT 0.6  PROT 8.2*  ALBUMIN 4.4    Recent Labs Lab 08/15/16 1736  LIPASE 13   No results for input(s): AMMONIA in the last 168 hours. Coagulation Profile: No results for input(s): INR, PROTIME in the last 168 hours. Cardiac Enzymes: No results for input(s): CKTOTAL, CKMB, CKMBINDEX, TROPONINI in the last 168 hours. BNP (last 3 results) No results for input(s): PROBNP in the last 8760 hours. HbA1C: No results for input(s): HGBA1C in the last 72 hours. CBG: No results for input(s): GLUCAP in the last 168 hours. Lipid Profile: No results for input(s): CHOL, HDL, LDLCALC, TRIG, CHOLHDL, LDLDIRECT in the last 72 hours. Thyroid Function Tests: No results for input(s): TSH, T4TOTAL, FREET4, T3FREE, THYROIDAB in the last 72 hours. Anemia Panel:  Recent Labs  08/21/16 1227  VITAMINB12 1,718*   Sepsis Labs: No results for input(s): PROCALCITON, LATICACIDVEN in the last 168 hours.  No results found for this or any previous  visit (from the past 240 hour(s)).       Radiology Studies: Mr Laqueta Jean Wo Contrast  Result Date: 08/20/2016 CLINICAL DATA:  Dizziness, nausea, vomiting and facial numbness for 6 days. EXAM: MRI HEAD WITHOUT AND WITH CONTRAST TECHNIQUE: Multiplanar, multiecho pulse sequences of the brain and surrounding structures were obtained without and with intravenous contrast. CONTRAST:  3mL MULTIHANCE GADOBENATE DIMEGLUMINE 529 MG/ML IV SOLN COMPARISON:  None. FINDINGS: INTRACRANIAL CONTENTS: At least 2 infratentorial white matter lesions including 9 mm RIGHT cerebellar white matter and 7 mm lesion LEFT posterior pontomedullary junction. Greater than 10 supratentorial white matter lesions predominately radiating from the periventricular white matter with low T1 signal compatible with black holes of the demyelination. Dominant 16 mm lesion RIGHT trigone. Faintly enhancing 7 mm RIGHT posterior temporal lobe lesion with T2 shine through. 7 mm LEFT frontal lobe enhancing white matter lesion. No susceptibility artifact to suggest hemorrhage.  Ventricles and sulci are normal for patient's age. No midline shift, mass effect. No abnormal extra-axial fluid collections. VASCULAR: Normal major intracranial vascular flow voids present at skull base. SKULL AND UPPER CERVICAL SPINE: No abnormal sellar expansion. No suspicious calvarial bone marrow signal. Craniocervical junction maintained. SINUSES/ORBITS: The mastoid air-cells and included paranasal sinuses are well-aerated.The included ocular globes and orbital contents are non-suspicious. Dysconjugate gaze may be transient. Examination not tailored for assessment of optic neuritis. OTHER: None. IMPRESSION: At least 2 infratentorial and greater than 10 supratentorial white matter lesions compatible with moderate chronic demyelination. Superimposed RIGHT temporal and LEFT frontal lobe acute enhancing demyelinating plaques. No parenchymal brain volume loss for age. Electronically  Signed   By: Awilda Metro M.D.   On: 08/20/2016 18:23        Scheduled Meds: . methylPREDNISolone (SOLU-MEDROL) injection  1,000 mg Intravenous Daily  . pantoprazole (PROTONIX) IV  40 mg Intravenous Q24H  . polyethylene glycol  17 g Oral Daily  . sodium chloride flush  3 mL Intravenous Q12H   Continuous Infusions:   LOS: 2 days    Time spent: 40 mins    Alcario Tinkey, MD [facility] Pager [phone]  If 7PM-7AM, please contact night-coverage [email or web address] Password [email or web address] 08/22/2016, 11:48 AM

## 2016-08-22 NOTE — Evaluation (Signed)
Physical Therapy Evaluation Patient Details Name: Amanda Horne MRN: [id number] DOB: 02-09-1986 Today's Date: 08/22/2016   History of Present Illness  31 year old with progressive neurological changes.  Diagnosed with MS.  Clinical Impression  Pt OOB for first time - walked to bathroom with RW and min assist. She tends to adduct her right leg and ataxic gait.  Pt cued to walk with wide base.  Vision big factor in her mobilty as well.  Pt would be great CIR candidate.  I talked to family and pt about taking about what help will be available for pt at DC (as now pt lives alone with 3 children).  Will continue with PT to help improve her safety with mobilty and activity tolerance    Follow Up Recommendations CIR;Supervision/Assistance - 24 hour    Equipment Recommendations  Rolling walker with 5" wheels (will continue to assess as pt progresses)    Recommendations for Other Services Rehab consult     Precautions / Restrictions Precautions Precautions: Fall Precaution Comments: pt denies any falls or mobilty issues in past.  She says she has always been clumsy Restrictions Weight Bearing Restrictions: No      Mobility  Bed Mobility Overal bed mobility: Modified Independent                Transfers Overall transfer level: Needs assistance Equipment used: Rolling walker (2 wheeled) Transfers: Sit to/from Stand Sit to Stand: Min guard         General transfer comment: pt needed cueing for hand placement.  pt with poor initial standing balance - right leg wanting to adduct and pt wanting to loose balance posterior  Ambulation/Gait Ambulation/Gait assistance: Min assist Ambulation Distance (Feet): 30 Feet Assistive device: Rolling walker (2 wheeled)       General Gait Details: pt unsteady - used RW and cued to stand with wide base when walking.  will need to further assess if due to legs or mostly vision.  pt seeing objects in front of her -had to give her verbal cuing  to know her path is clear, to turn etc.  Pt did better with wide gait - occiasionally wanting to adduct legs and loose balance - esp with turning.  pt needed moderate verbal cuing for all mobilty..  pt walked to door and back adn then walked into the bathroom and to sink to wash her hands.    Stairs            Wheelchair Mobility    Modified Rankin (Stroke Patients Only)       Balance Overall balance assessment: Needs assistance             Standing balance comment: pt not steady with standing balance - she tends to adduct right and looses balance posterior.  pt cued to stand with wide base and walk with wide base to help increase her safety                             Pertinent Vitals/Pain Pain Assessment: No/denies pain    Home Living Family/patient expects to be discharged to:: Private residence Living Arrangements: Alone;Children (pt lives with 25, 43 and 78 year old) Available Help at Discharge: Family;Friend(s) Type of Home: House Home Access: Stairs to enter     Home Layout: Two level (pt said seh could make a bedroom downstairs if she had too) Home Equipment: None      Prior  Function Level of Independence: Independent         Comments: pt independent.  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     Higher education careers adviser        Extremity/Trunk Assessment   Upper Extremity Assessment Upper Extremity Assessment: Defer to OT evaluation    Lower Extremity Assessment Lower Extremity Assessment: Generalized weakness (pt grossly 4/5 throughout.)    Cervical / Trunk Assessment Cervical / Trunk Assessment: Normal  Communication   Communication: No difficulties  Cognition Arousal/Alertness: Awake/alert Behavior During Therapy: Flat affect Overall Cognitive Status: Within Functional Limits for tasks assessed                                        General Comments General comments (skin integrity, edema,  etc.): pts eyes are not at all going in same direction - right eye drifting to right but noted to occasionally look ahead and then left eye would be looking left.  t his is upsetting to pt - i had to talk her into sitting up in chiar -she wants to stay in bed with eyes closed    Exercises     Assessment/Plan    PT Assessment Patient needs continued PT services  PT Problem List Decreased activity tolerance;Decreased balance;Decreased mobility;Decreased knowledge of use of DME;Decreased safety awareness       PT Treatment Interventions DME instruction;Gait training;Functional mobility training;Therapeutic activities;Therapeutic exercise;Balance training;Patient/family education;Neuromuscular re-education    PT Goals (Current goals can be found in the Care Plan section)       Frequency Min 3X/week   Barriers to discharge Decreased caregiver support I talked to family (sister and mother and pt) about talking about plans for pt when she goes home if she needs another adult in the home at DC.  Mother said she could stay with pt    Co-evaluation               End of Session Equipment Utilized During Treatment: Gait belt Activity Tolerance: Patient tolerated treatment well Patient left: in chair;with family/visitor present;with call bell/phone within reach Nurse Communication: Mobility status PT Visit Diagnosis: Unsteadiness on feet (R26.81);Muscle weakness (generalized) (M62.81);Other abnormalities of gait and mobility (R26.89);Ataxic gait (R26.0)    Time:  -      Charges:         PT G Codes:       August 31, 2016   Ranae Palms, PT  Judson Roch August 31, 2016, 2:06 PM

## 2016-08-23 DIAGNOSIS — I10 Essential (primary) hypertension: Secondary | ICD-10-CM

## 2016-08-23 DIAGNOSIS — G35 Multiple sclerosis: Principal | ICD-10-CM

## 2016-08-23 LAB — CBC WITH DIFFERENTIAL/PLATELET
BASOS PCT: 0 %
Basophils Absolute: 0 10*3/uL (ref 0.0–0.1)
EOS ABS: 0 10*3/uL (ref 0.0–0.7)
Eosinophils Relative: 0 %
HCT: 36.9 % (ref 36.0–46.0)
HEMOGLOBIN: 11.8 g/dL — AB (ref 12.0–15.0)
Lymphocytes Relative: 12 %
Lymphs Abs: 1.9 10*3/uL (ref 0.7–4.0)
MCH: 24.2 pg — ABNORMAL LOW (ref 26.0–34.0)
MCHC: 32 g/dL (ref 30.0–36.0)
MCV: 75.8 fL — ABNORMAL LOW (ref 78.0–100.0)
MONOS PCT: 9 %
Monocytes Absolute: 1.4 10*3/uL — ABNORMAL HIGH (ref 0.1–1.0)
NEUTROS PCT: 79 %
Neutro Abs: 12 10*3/uL — ABNORMAL HIGH (ref 1.7–7.7)
Platelets: 244 10*3/uL (ref 150–400)
RBC: 4.87 MIL/uL (ref 3.87–5.11)
RDW: 14.4 % (ref 11.5–15.5)
WBC: 15.3 10*3/uL — AB (ref 4.0–10.5)

## 2016-08-23 LAB — BASIC METABOLIC PANEL
Anion gap: 10 (ref 5–15)
BUN: 17 mg/dL (ref 6–20)
CALCIUM: 9 mg/dL (ref 8.9–10.3)
CHLORIDE: 106 mmol/L (ref 101–111)
CO2: 24 mmol/L (ref 22–32)
CREATININE: 0.58 mg/dL (ref 0.44–1.00)
GFR calc non Af Amer: >60 mL/min (ref >60)
Glucose, Bld: 123 mg/dL — ABNORMAL HIGH (ref 65–99)
Potassium: 4.2 mmol/L (ref 3.5–5.1)
SODIUM: 140 mmol/L (ref 135–145)

## 2016-08-23 LAB — MAGNESIUM: MAGNESIUM: 2.4 mg/dL (ref 1.7–2.4)

## 2016-08-23 LAB — NEUROMYELITIS OPTICA AUTOAB, IGG

## 2016-08-23 MED ORDER — PANTOPRAZOLE SODIUM 40 MG PO TBEC
40.0000 mg | DELAYED_RELEASE_TABLET | Freq: Every day | ORAL | Status: DC
  Administered 2016-08-24: 40 mg via ORAL
  Filled 2016-08-23: qty 1

## 2016-08-23 NOTE — Progress Notes (Signed)
Rehab Admissions Coordinator Note:  Patient was screened by Trish Mage for appropriateness for an Inpatient Acute Rehab Consult.  At this time, an inpatient rehab consult has been ordered and is pending completion.  I will follow up once consult is completed.  Trish Mage 08/23/2016, 8:32 AM  I can be reached at [phone].

## 2016-08-23 NOTE — Progress Notes (Signed)
PROGRESS NOTE    Amanda Horne  RUE:[id number] DOB: 07-Jan-1986 DOA: 08/20/2016 PCP: Cassell Smiles, MD    Brief Narrative:  31 y.o.woman with a history of varicella and herpes infections who presented to the ED for evaluation after developing progressive neurological symptoms. She is accompanied by several family members who assist with her clinical history. The patient presented to the ED at [facility] on Thursday 3/29 with complaints of nausea and vomiting. She received IV fluids and zofran. She improved. Her diagnosis was a viral illness. By Saturday, the family called 911 because she was having new dizziness and headache. She was assessed at home, and reportedly transport to the ED was "refused". She was advised to follow-up with her PCP, which she did on yesterday. She reported persistent symptoms, and was diagnosed with vertigo. She was given a prescription for meclizine, which she has taken approximately three times in the past 24 hours, with no improvement in her condition. She has also taken Excedrin for her headache, without sustained relief. Today, she developed double vision and a right gaze preference, which prompted presentation to the ED.  MRI with findings consistent with MS. Neurology requested transfer to [facility]. Started on high dose steroids.    Assessment & Plan:   Principal Problem:   Multiple sclerosis (HCC) Active Problems:   Facial numbness   Vertigo   Headache   Double vision   Numbness  1-Acute MS/ right internuclear ophthalmoplegia/right facial numbness:  --newly diagnosed -continue IV solumedrol 1000 mg daily times a total of 5 days. -will need outpatient PT/OT. -neurology on board - ANA, and antinuclear antibodies negative, anti-scleroderma antibodies ordered negative and currently pending. -further work up to be decided by them -given steroids started on Protonix for GI prophylaxis  -continue supportive care and PRN analgesics for  HA's -patient transferred to [facility] as per neurology recommendations. - Patient will likely need outpatient follow-up with neurology, Dr. Epimenio Foot for long-term management of MS.  2-HTN -prior hx of elevated BP around pregnancy -off medications now -steroids most likely making BP to be elevated as well -will use PRN hydralazine  3. Headache Likely related to problem #1. Improved. Symptomatic care.   DVT prophylaxis: SCDs Code Status: Full Family Communication: Updated patient and family at bedside. Disposition Plan: Home with outpatient PT/OT when clinically improved and completed high-dose pulse steroids and per neurology.   Consultants:   Neurology: Dr.Oster 08/21/2016  Procedures:   MRI brain 08/20/2016  Antimicrobials:   None   Subjective: Patient states improvement with headache. Patient states had some numbness and tingling right upper extremity this morning which has since resolved and feels she may have played on her hand while sleeping. Therapist placed tape over right lens of patient's eyeglasses which patient states has helped her double vision.   Objective: Vitals:   08/22/16 2135 08/23/16 0121 08/23/16 0541 08/23/16 1024  BP: 139/86 133/81 129/84 132/89  Pulse: 85 (!) 102 85 83  Resp: 20 20 20 20   Temp: 98.4 F (36.9 C) 98.6 F (37 C) 98.7 F (37.1 C) 99 F (37.2 C)  TempSrc: Oral Oral Oral Oral  SpO2: 95% 96% 96% 94%  Weight:      Height:        Intake/Output Summary (Last 24 hours) at 08/23/16 1140 Last data filed at 08/23/16 0343  Gross per 24 hour  Intake              480 ml  Output  0 ml  Net              480 ml   Filed Weights   08/20/16 2111 08/22/16 0022  Weight: 82.8 kg (182 lb 8.7 oz) 87.4 kg (192 lb 10.9 oz)    Examination:  General exam: Appears calm and comfortable  Respiratory system: Clear to auscultation. Respiratory effort normal. Cardiovascular system: S1 & S2 heard, RRR. No JVD, murmurs, rubs, gallops or  clicks. No pedal edema. Gastrointestinal system: Abdomen is nondistended, soft and nontender. No organomegaly or masses felt. Normal bowel sounds heard. Central nervous system: Alert and oriented. No focal neurological deficits. Extremities: Symmetric 5 x 5 power. Skin: No rashes, lesions or ulcers Psychiatry: Judgement and insight appear normal. Mood & affect appropriate.     Data Reviewed: I have personally reviewed following labs and imaging studies  CBC:  Recent Labs Lab 08/20/16 1518 08/21/16 0554 08/22/16 0859 08/23/16 0309  WBC 11.8* 12.4* 21.9* 15.3*  NEUTROABS 9.1*  --  18.1* 12.0*  HGB 13.9 12.9 12.7 11.8*  HCT 42.0 39.3 38.8 36.9  MCV 76.4* 75.6* 75.6* 75.8*  PLT 270 256 249 244   Basic Metabolic Panel:  Recent Labs Lab 08/20/16 1518 08/22/16 0859 08/23/16 0309  NA 139 139 140  K 3.7 3.7 4.2  CL 105 108 106  CO2 26 22 24   GLUCOSE 101* 155* 123*  BUN 10 15 17   CREATININE 0.54 0.63 0.58  CALCIUM 9.3 9.2 9.0  MG  --  2.2 2.4   GFR: Estimated Creatinine Clearance: 105.8 mL/min (by C-G formula based on SCr of 0.58 mg/dL). Liver Function Tests: No results for input(s): AST, ALT, ALKPHOS, BILITOT, PROT, ALBUMIN in the last 168 hours. No results for input(s): LIPASE, AMYLASE in the last 168 hours. No results for input(s): AMMONIA in the last 168 hours. Coagulation Profile: No results for input(s): INR, PROTIME in the last 168 hours. Cardiac Enzymes: No results for input(s): CKTOTAL, CKMB, CKMBINDEX, TROPONINI in the last 168 hours. BNP (last 3 results) No results for input(s): PROBNP in the last 8760 hours. HbA1C: No results for input(s): HGBA1C in the last 72 hours. CBG: No results for input(s): GLUCAP in the last 168 hours. Lipid Profile: No results for input(s): CHOL, HDL, LDLCALC, TRIG, CHOLHDL, LDLDIRECT in the last 72 hours. Thyroid Function Tests: No results for input(s): TSH, T4TOTAL, FREET4, T3FREE, THYROIDAB in the last 72 hours. Anemia  Panel:  Recent Labs  08/21/16 1227  VITAMINB12 1,718*   Sepsis Labs: No results for input(s): PROCALCITON, LATICACIDVEN in the last 168 hours.  No results found for this or any previous visit (from the past 240 hour(s)).       Radiology Studies: No results found.      Scheduled Meds: . methylPREDNISolone (SOLU-MEDROL) injection  1,000 mg Intravenous Daily  . [START ON 08/24/2016] pantoprazole  40 mg Oral Q0600  . polyethylene glycol  17 g Oral Daily  . sodium chloride flush  3 mL Intravenous Q12H   Continuous Infusions:   LOS: 3 days    Time spent: 40 mins    Darald Uzzle, MD [facility] Pager [phone]  If 7PM-7AM, please contact night-coverage [email or web address] Password [email or web address] 08/23/2016, 11:40 AM

## 2016-08-23 NOTE — Progress Notes (Signed)
Patient is  reporting numbness and tingling in her right arm. MD notified  Will continue to monitor.

## 2016-08-23 NOTE — Progress Notes (Addendum)
Subjective: The patient's sister is at the bedside. The patient feels she is doing better. Her diplopia has for the most part resolved. The therapist put tape over the right lens of her eye glasses which has helped significantly; however, even without her glasses her vision appears improved. She is able to ambulate with improved balance since her glasses were modified. 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 She is currently day 3/5 of steroid therapy.  Objective: Current vital signs: BP 132/89 (BP Location: Left Arm)   Pulse 83   Temp 99 F (37.2 C) (Oral)   Resp 20   Ht 5\' 3"  (1.6 m)   Wt 87.4 kg (192 lb 10.9 oz)   SpO2 94%   BMI 34.13 kg/m  Vital signs in last 24 hours: Temp:  [98.4 F (36.9 C)-99 F (37.2 C)] 99 F (37.2 C) (04/06 1024) Pulse Rate:  [83-102] 83 (04/06 1024) Resp:  [19-20] 20 (04/06 1024) BP: (129-153)/(81-95) 132/89 (04/06 1024) SpO2:  [93 %-96 %] 94 % (04/06 1024)  Intake/Output from previous day: 04/05 0701 - 04/06 0700 In: 538 [P.O.:480; IV Piggyback:58] Out: -  Intake/Output this shift: No intake/output data recorded. Nutritional status: Diet regular Room service appropriate? Yes; Fluid consistency: Thin  Physical Exam  General - pleasant 31 year old female in bed in no acute distress. Heart - Regular rate and rhythm - no murmer Lungs - Clear to auscultation Abdomen - Soft - non tender Extremities - Distal pulses intact - no edema Skin - Warm and dry  Neurologic Exam:  MENTAL STATUS: awake, alert, Language fluent Follows simple commands. Naming intact   CRANIAL NERVES: Disconjugate gaze with the right eye deviating towards the right. MOTOR: normal bulk and tone, full strength in the BUE, BLE  SENSORY: normal and symmetric to light touch  COORDINATION: finger-nose-finger normal - heel to shin normal - rapid alternating movements normal REFLEXES: deep tendon reflexes present and symmetric - no  babinski GAIT/STATION: Not tested   Lab Results: Basic Metabolic Panel:  Recent Labs Lab 08/20/16 1518 08/22/16 0859 08/23/16 0309  NA 139 139 140  K 3.7 3.7 4.2  CL 105 108 106  CO2 26 22 24   GLUCOSE 101* 155* 123*  BUN 10 15 17   CREATININE 0.54 0.63 0.58  CALCIUM 9.3 9.2 9.0  MG  --  2.2 2.4    Liver Function Tests: No results for input(s): AST, ALT, ALKPHOS, BILITOT, PROT, ALBUMIN in the last 168 hours. No results for input(s): LIPASE, AMYLASE in the last 168 hours. No results for input(s): AMMONIA in the last 168 hours.  CBC:  Recent Labs Lab 08/20/16 1518 08/21/16 0554 08/22/16 0859 08/23/16 0309  WBC 11.8* 12.4* 21.9* 15.3*  NEUTROABS 9.1*  --  18.1* 12.0*  HGB 13.9 12.9 12.7 11.8*  HCT 42.0 39.3 38.8 36.9  MCV 76.4* 75.6* 75.6* 75.8*  PLT 270 256 249 244    Cardiac Enzymes: No results for input(s): CKTOTAL, CKMB, CKMBINDEX, TROPONINI in the last 168 hours.  Lipid Panel: No results for input(s): CHOL, TRIG, HDL, CHOLHDL, VLDL, LDLCALC in the last 168 hours.  CBG: No results for input(s): GLUCAP in the last 168 hours.  Microbiology: Results for orders placed or performed in visit on 03/29/13  OB RESULT CONSOLE Group B Strep     Status: None   Collection Time: 03/08/13 12:00 AM  Result Value Ref Range Status   GBS Negative  Final    Coagulation  Studies: No results for input(s): LABPROT, INR in the last 72 hours.  Imaging:  MR BRAIN W WO CONTRAST 08/20/2016 At least 2 infratentorial and greater than 10 supratentorial white matter lesions compatible with moderate chronic demyelination. Superimposed RIGHT temporal and LEFT frontal lobe acute enhancing demyelinating plaques. No parenchymal brain volume loss for age.   Medications:  Scheduled: . methylPREDNISolone (SOLU-MEDROL) injection  1,000 mg Intravenous Daily  . pantoprazole (PROTONIX) IV  40 mg Intravenous Q24H  . polyethylene glycol  17 g Oral Daily  . sodium chloride flush  3 mL  Intravenous Q12H    Assessment/Plan:  Pleasant 31 year old female with a new diagnosis of MS currently being treated with a 5 day course of intravenous steroids. She appears to be significantly improved. Inpatient rehabilitation is being considered.  Day #3/5 IV steroids. First dose Wednesday 08/21/2016.   Delton See PA-C [facility] Pager [phone] 08/23/2016, 11:23 AM  Neurology Attending Addendum  This patient was seen, examined, and d/w PA. I have reviewed the note and agree with the findings, assessment and plan as documented with the following additions.   No major overnight events. She reports that her symptoms continue to improve. OT occluded one of the lenses of her glasses with tape to assist with her double vision and she feels this has greatly improved her function. No new concerns. Headache resolved. She has some transient R arm numbness this morning but thinks it was due to positioning.   Exam: As per PA note. Still with R INO, otherwise unremarkable.   Pertinent labs: ANA negative Scl-70 Abs negative NMO Abs pending  Impression: 1. MS 2. R INO 3. Numbness, R face 4. Headache  Recommendations: As per PA note. Continue IV solumedrol, today is day #4/5. Appreciate PT/OT help. F/u with Dr. Epimenio Foot as outpatient.   D/w pt and family. They are in agreement with the plan as noted. They were given the chance to ask any questions and these were addressed to their satisfaction.  D/w attending Dr. Janee Morn.

## 2016-08-23 NOTE — Progress Notes (Signed)
qPhysical Therapy Treatment Patient Details Name: Amanda Horne MRN: [id number] DOB: [birth year] Today's Date: 08/23/2016    History of Present Illness Pt is a 31 y/o female who was diagnosed with MS on 08/21/16. She presented to the ED on 08/21/16 complaining of double vision, blurred vision and some decreased sensation in the right face. Pt has no pertinent medical history on file. MRI revealed This shows several scattered lesions in the bihemispheric white matter, the majority of which are periventricular in location. There are 2 lesions in the posterior fossa, one in the right cerebellum and one in the left pontomedullary junction. Two lesions enhance, one in the posterior right temporal lobe and one in the left frontal lobe. 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    PT Comments    Pt increasing ambulation distance this session, however, continues to exhibit balance deficits requiring min A for steadying. Pt continues to require min A to prevent LOB, especially when turning secondary to narrow BOS. Feel pt would greatly benefit from CIR at d/c and would help to increase independence and safety with functional mobility at home, however, noted CIR recommendations of OP PT. If CIR not an option, pt will need 24/7 assist and neuro outpatient PT. Will continue to follow.   Follow Up Recommendations  Supervision/Assistance - 24 hour;CIR;Outpatient PT     Equipment Recommendations  Rolling walker with 5" wheels    Recommendations for Other Services       Precautions / Restrictions Precautions Precautions: Fall Restrictions Weight Bearing Restrictions: No    Mobility  Bed Mobility Overal bed mobility: Modified Independent                Transfers Overall transfer level: Needs assistance Equipment used: Rolling walker (2 wheeled) Transfers: Sit to/from Stand Sit to Stand: Min guard         General transfer comment: Min guard for safety. Verbal cues for appropriate  UE placement.   Ambulation/Gait Ambulation/Gait assistance: Min assist Ambulation Distance (Feet): 100 Feet Assistive device: Rolling walker (2 wheeled) Gait Pattern/deviations: Step-through pattern;Narrow base of support Gait velocity: Decreased Gait velocity interpretation: Below normal speed for age/gender General Gait Details: Pt with improved steadiness with use of taped glasses. Pt reporting she feels more steady with gait. Is able to self correct narrow BOS during gait. Educated about energy conservation and activity pacing during ambulation. Pt demonstrating increased unsteadiness during turns, and requiring verbal cues for wide BOS and visual scanning. Min A to prevent LOB.    Stairs            Wheelchair Mobility    Modified Rankin (Stroke Patients Only)       Balance Overall balance assessment: Needs assistance Sitting-balance support: Feet supported;No upper extremity supported Sitting balance-Leahy Scale: Good     Standing balance support: Bilateral upper extremity supported;During functional activity Standing balance-Leahy Scale: Poor Standing balance comment: Pt reliant on RW for balance during standing and mobility                            Cognition Arousal/Alertness: Awake/alert Behavior During Therapy: WFL for tasks assessed/performed Overall Cognitive Status: Within Functional Limits for tasks assessed                                        Exercises      General Comments  General comments (skin integrity, edema, etc.): Pt's aunt and pt asking about use of rollator at home. Educated about increasing stability with [place] of RW during ambulation. Educated about community MS resources. Pt reports MD came to see from CIR and reported she would benefit from OP PT at d/c.       Pertinent Vitals/Pain Pain Assessment: No/denies pain    Home Living                      Prior Function            PT Goals (current  goals can now be found in the care plan section) Acute Rehab PT Goals Patient Stated Goal: to get back home PT Goal Formulation: With patient Time For Goal Achievement: 08/29/16 Potential to Achieve Goals: Good Progress towards PT goals: Progressing toward goals    Frequency    Min 3X/week      PT Plan Current plan remains appropriate    Co-evaluation             End of Session Equipment Utilized During Treatment: Gait belt Activity Tolerance: Patient tolerated treatment well Patient left: in bed;with call bell/phone within reach;with family/visitor present Nurse Communication: Mobility status PT Visit Diagnosis: Unsteadiness on feet (R26.81);Muscle weakness (generalized) (M62.81);Other abnormalities of gait and mobility (R26.89);Ataxic gait (R26.0)     Time: 6222-9798 PT Time Calculation (min) (ACUTE ONLY): 11 min  Charges:  $Gait Training: 8-22 mins                    G Codes:       Margot Chimes, PT, DPT  Acute Rehabilitation Services  Pager: [phone]   Melvyn Novas 08/23/2016, 2:25 PM

## 2016-08-24 LAB — BASIC METABOLIC PANEL
ANION GAP: 9 (ref 5–15)
BUN: 14 mg/dL (ref 6–20)
CALCIUM: 9 mg/dL (ref 8.9–10.3)
CO2: 25 mmol/L (ref 22–32)
Chloride: 104 mmol/L (ref 101–111)
Creatinine, Ser: 0.57 mg/dL (ref 0.44–1.00)
Glucose, Bld: 109 mg/dL — ABNORMAL HIGH (ref 65–99)
Potassium: 4.1 mmol/L (ref 3.5–5.1)
SODIUM: 138 mmol/L (ref 135–145)

## 2016-08-24 MED ORDER — POLYETHYLENE GLYCOL 3350 17 G PO PACK: 17.0000 g | PACK | Freq: Every day | ORAL | 0 refills | Status: DC

## 2016-08-24 MED ORDER — HYDROCODONE-ACETAMINOPHEN 5-325 MG PO TABS: 1.0000 | ORAL_TABLET | ORAL | 0 refills | Status: DC | PRN

## 2016-08-24 MED ORDER — PANTOPRAZOLE SODIUM 40 MG PO TBEC: 40.0000 mg | DELAYED_RELEASE_TABLET | Freq: Every day | ORAL | 3 refills | Status: DC

## 2016-08-24 NOTE — Progress Notes (Signed)
Discharge instructions reviewed with patient and family. Follow up appointment encouraged. No further questions at this time

## 2016-08-24 NOTE — Discharge Summary (Signed)
Physician Discharge Summary  Amanda Horne:[id number] DOB: Sep 29, 1985 DOA: 08/20/2016  PCP: Cassell Smiles, MD  Admit date: 08/20/2016 Discharge date: 08/24/2016  Time spent: 65 minutes  Recommendations for Outpatient Follow-up:  1. Follow-up with Dr. Epimenio Foot, neurology on 09/05/2016 for further evaluation and management of newly diagnosed multiple sclerosis. 2. Follow-up with Cassell Smiles, MD in 2 weeks. On follow-up patient's blood pressure needs to be reassessed. Patient wanted a basic metabolic profile done to follow-up on electrolytes and renal function.   Discharge Diagnoses:  Principal Problem:   Multiple sclerosis (HCC) Active Problems:   Facial numbness   Vertigo   Headache   Double vision   Numbness   Essential hypertension   Discharge Condition: Stable and improved  Diet recommendation: Regular  Filed Weights   08/20/16 2111 08/22/16 0022  Weight: 82.8 kg (182 lb 8.7 oz) 87.4 kg (192 lb 10.9 oz)    History of present illness:  Per Dr Amanda Horne is a 31 y.o. woman with a history of varicella and herpes infections who presented to the ED for evaluation after developing progressive neurological symptoms.  She was accompanied by several family members who assistted with her clinical history.  The patient presented to the ED at [facility] on Thursday 3/29 with complaints of nausea and vomiting.  She received IV fluids and zofran.  She improved.  Her diagnosis was a viral illness.  By Saturday, the family called 911 because she was having new dizziness and headache.  She was assessed at home, and reportedly transport to the ED was "refused".  She was advised to follow-up with her PCP, which she did 1 day prior to admission.  She reported persistent symptoms, and was diagnosed with vertigo.  She was given a prescription for meclizine, which she has taken approximately three times in the past 24 hours, with no improvement in her condition.  She has also taken  Excedrin for her headache, without sustained relief.  On the day of admission, she developed double vision and a right gaze preference, which prompted presentation to the ED.  No documented fever, but she has had chills and sweats.   She denied cough, cold, or congestion.  No syncope.  No LUTS.  No focal weakness.    ED Course: Unfortunately, MRI showed at least two infratentorial and greater than 10 supratentorial white matter lesions compatible with moderate chronic demyelination.  She also had right temporal and left frontal lobe acute enhancing demyelinating plaques.  Findings concerning for MS.  Neurology was consulted.  Hospitalist asked to admit.  Hospital Course:  1-Acute MS/ right internuclear ophthalmoplegia/right facial numbness:  --newly diagnosed per MRI. - Neurology was consulted and patient was seen in consultation by Dr.Oster who assessed the patient felt patient's symptoms and imaging findings with typical of multiple sclerosis. Patient had no signs or symptoms of infection. Patient was placed on high-dose IV Solu-Medrol 1000 mg daily and treated for total of 5 days. -will need outpatient PT/OT. -neurology on board - ANA, and antinuclear antibodies negative, anti-scleroderma antibodies ordered negative. - Patient was also placed on Protonix for GI prophylaxis. - Patient was followed during the hospitalization and seen by PT/OT who recommended outpatient PT/OT. - Patient was transferred from [place] [facility] to be followed by neurology. Patient improved clinically. - Patient's headache was managed with analgesics during the hospitalization. -Patient was discharged home in stable and improved condition with a walker. - Patient will follow-up with Dr.Sater  of neurology 09/05/2016 for further evaluation and recommendations.  2-HTN -prior hx of elevated BP around pregnancy -off medications now -steroids most likely making BP to be elevated as well -Patient  placed on PRN hydralazine - Blood pressure improved during the hospitalization outpatient follow-up.  3. Headache Likely related to problem #1. Improved. Symptomatic care.  Procedures:  MRI brain 08/20/2016   Consultations:  Neurology: Dr.Oster 08/21/2016  Discharge Exam: Vitals:   08/24/16 0537 08/24/16 0959  BP: (!) 143/91 130/83  Pulse: 78 88  Resp: 18 18  Temp: 99.1 F (37.3 C) 99 F (37.2 C)    General: NAD Cardiovascular: RRR Respiratory: CTAB  Discharge Instructions   Discharge Instructions    Ambulatory referral to Neurology    Complete by:  As directed    An appointment is requested in approximately: 1-2 week   Ambulatory referral to Occupational Therapy    Complete by:  As directed    Ambulatory referral to Physical Therapy    Complete by:  As directed    Diet general    Complete by:  As directed    Increase activity slowly    Complete by:  As directed      Current Discharge Medication List    START taking these medications   Details  HYDROcodone-acetaminophen (NORCO/VICODIN) 5-325 MG tablet Take 1-2 tablets by mouth every 4 (four) hours as needed for moderate pain. Qty: 20 tablet, Refills: 0    pantoprazole (PROTONIX) 40 MG tablet Take 1 tablet (40 mg total) by mouth daily at 6 (six) AM. Qty: 30 tablet, Refills: 3    polyethylene glycol (MIRALAX / GLYCOLAX) packet Take 17 g by mouth daily. Qty: 14 each, Refills: 0      CONTINUE these medications which have NOT CHANGED   Details  aspirin-acetaminophen-caffeine (EXCEDRIN MIGRAINE) 250-250-65 MG tablet Take 1 tablet by mouth every 6 (six) hours as needed for headache.    ibuprofen (ADVIL,MOTRIN) 200 MG tablet Take 800 mg by mouth every 6 (six) hours as needed (migraine).    MedroxyPROGESTERone Acetate (DEPO-PROVERA IM) Inject into the muscle every 3 (three) months.    ondansetron (ZOFRAN ODT) 8 MG disintegrating tablet Take 1 tablet (8 mg total) by mouth every 8 (eight) hours as needed for  nausea or vomiting. Qty: 20 tablet, Refills: 0      STOP taking these medications     phentermine (ADIPEX-P) 37.5 MG tablet        No Known Allergies Follow-up Information    SATER,RICHARD A, MD Follow up on 09/05/2016.   Specialty:  Neurology Why:  f/u at 830am. Contact information: [address] [place] [address] [phone]        [facility] Follow [facility].   Specialty:  Rehabilitation Why:  Outpatient rehab center for physical therapy. Call Monday to schedule appointment.  Contact information: [address] [id number] mc [place] [address] [phone]       Cassell Smiles, MD. Schedule an appointment as soon as possible for a visit in 2 week(s).   Specialty:  Internal Medicine Contact information: [address] [place] [address] [phone]            The results of significant diagnostics from this hospitalization (including imaging, microbiology, ancillary and laboratory) are listed below for reference.    Significant Diagnostic Studies: Mr Laqueta Jean Wo Contrast  Result Date: 08/20/2016 CLINICAL DATA:  Dizziness, nausea, vomiting and facial numbness for 6 days. EXAM: MRI HEAD WITHOUT AND  WITH CONTRAST TECHNIQUE: Multiplanar, multiecho pulse sequences of the brain and surrounding structures were obtained without and with intravenous contrast. CONTRAST:  15mL MULTIHANCE GADOBENATE DIMEGLUMINE 529 MG/ML IV SOLN COMPARISON:  None. FINDINGS: INTRACRANIAL CONTENTS: At least 2 infratentorial white matter lesions including 9 mm RIGHT cerebellar white matter and 7 mm lesion LEFT posterior pontomedullary junction. Greater than 10 supratentorial white matter lesions predominately radiating from the periventricular white matter with low T1 signal compatible with black holes of the demyelination. Dominant 16 mm lesion RIGHT trigone. Faintly enhancing 7 mm RIGHT posterior temporal lobe lesion  with T2 shine through. 7 mm LEFT frontal lobe enhancing white matter lesion. No susceptibility artifact to suggest hemorrhage. Ventricles and sulci are normal for patient's age. No midline shift, mass effect. No abnormal extra-axial fluid collections. VASCULAR: Normal major intracranial vascular flow voids present at skull base. SKULL AND UPPER CERVICAL SPINE: No abnormal sellar expansion. No suspicious calvarial bone marrow signal. Craniocervical junction maintained. SINUSES/ORBITS: The mastoid air-cells and included paranasal sinuses are well-aerated.The included ocular globes and orbital contents are non-suspicious. Dysconjugate gaze may be transient. Examination not tailored for assessment of optic neuritis. OTHER: None. IMPRESSION: At least 2 infratentorial and greater than 10 supratentorial white matter lesions compatible with moderate chronic demyelination. Superimposed RIGHT temporal and LEFT frontal lobe acute enhancing demyelinating plaques. No parenchymal brain volume loss for age. Electronically Signed   By: Awilda Metro M.D.   On: 08/20/2016 18:23    Microbiology: No results found for this or any previous visit (from the past 240 hour(s)).   Labs: Basic Metabolic Panel:  Recent Labs Lab 08/20/16 1518 08/22/16 0859 08/23/16 0309 08/24/16 0523  NA 139 139 140 138  K 3.7 3.7 4.2 4.1  CL 105 108 106 104  CO2 26 22 24 25   GLUCOSE 101* 155* 123* 109*  BUN 10 15 17 14   CREATININE 0.54 0.63 0.58 0.57  CALCIUM 9.3 9.2 9.0 9.0  MG  --  2.2 2.4  --    Liver Function Tests: No results for input(s): AST, ALT, ALKPHOS, BILITOT, PROT, ALBUMIN in the last 168 hours. No results for input(s): LIPASE, AMYLASE in the last 168 hours. No results for input(s): AMMONIA in the last 168 hours. CBC:  Recent Labs Lab 08/20/16 1518 08/21/16 0554 08/22/16 0859 08/23/16 0309  WBC 11.8* 12.4* 21.9* 15.3*  NEUTROABS 9.1*  --  18.1* 12.0*  HGB 13.9 12.9 12.7 11.8*  HCT 42.0 39.3 38.8 36.9   MCV 76.4* 75.6* 75.6* 75.8*  PLT 270 256 249 244   Cardiac Enzymes: No results for input(s): CKTOTAL, CKMB, CKMBINDEX, TROPONINI in the last 168 hours. BNP: BNP (last 3 results) No results for input(s): BNP in the last 8760 hours.  ProBNP (last 3 results) No results for input(s): PROBNP in the last 8760 hours.  CBG: No results for input(s): GLUCAP in the last 168 hours.     SignedRamiro Harvest MD.  [facility] 08/24/2016, 1:36 PM

## 2016-08-24 NOTE — Care Management Note (Signed)
Case Management Note  Patient Details  Name: JAELA COLLER MRN: [id number] Date of Birth: 09/10/1985  Subjective/Objective:                 New Dx MS. Referral made to [facility] for OP rehab. Included on AVS. DME RW to be delivered to room prior to discharge.   Action/Plan:  DC to home with RW. Will follow up with therapy as out patient.   Expected Discharge Date:  08/24/16               Expected Discharge Plan:  Acute to Acute Transfer  In-House Referral:     Discharge planning Services  CM Consult  Post Acute Care Choice:  Durable Medical Equipment Choice offered to:     DME Arranged:  Walker rolling DME Agency:  [facility]  HH Arranged:    [facility] Agency:     Status of Service:  Completed, signed off  If discussed at [employer] of Stay Meetings, dates discussed:    Additional Comments:  Lawerance Sabal, RN 08/24/2016, 3:18 PM

## 2016-08-24 NOTE — Progress Notes (Signed)
Neurology Progress Note  Subjective: She is doing better this morning, feels like her vision has improved. She still notes some binocular double vision, however. She does not appreciate any new symptoms today. She feels like the numbness on her right face and arm have resolved. She continues to tolerate her steroids without any ill effects.   Medications reviewed and reconciled.   Pertinent meds: Solumedrol 1000 mg IV daily, today is day #5/5  Current Meds:   Current Facility-Administered Medications:  .  acetaminophen (TYLENOL) tablet 650 mg, 650 mg, Oral, Q6H PRN, 650 mg at 08/21/16 1231 **OR** acetaminophen (TYLENOL) suppository 650 mg, 650 mg, Rectal, Q6H PRN, Michael Litter, MD .  hydrALAZINE (APRESOLINE) injection 10 mg, 10 mg, Intravenous, Q6H PRN, Vassie Loll, MD .  HYDROcodone-acetaminophen (NORCO/VICODIN) 5-325 MG per tablet 1-2 tablet, 1-2 tablet, Oral, Q4H PRN, Leanne Chang, NP, 2 tablet at 08/24/16 0528 .  methylPREDNISolone sodium succinate (SOLU-MEDROL) 1,000 mg in sodium chloride 0.9 % 50 mL IVPB, 1,000 mg, Intravenous, Daily, Ulice Dash, PA-C, 1,000 mg at 08/23/16 1020 .  ondansetron (ZOFRAN) tablet 4 mg, 4 mg, Oral, Q6H PRN **OR** ondansetron (ZOFRAN) injection 4 mg, 4 mg, Intravenous, Q6H PRN, Michael Litter, MD, 4 mg at 08/21/16 1746 .  pantoprazole (PROTONIX) EC tablet 40 mg, 40 mg, Oral, Q0600, Rodolph Bong, MD, 40 mg at 08/24/16 0528 .  polyethylene glycol (MIRALAX / GLYCOLAX) packet 17 g, 17 g, Oral, Daily, McAdmits Triadhosp, MD, 17 g at 08/23/16 1019 .  senna (SENOKOT) tablet 17.2 mg, 2 tablet, Oral, QHS PRN, McAdmits Triadhosp, MD, 17.2 mg at 08/22/16 0611 .  sodium chloride flush (NS) 0.9 % injection 3 mL, 3 mL, Intravenous, Q12H, Michael Litter, MD, 3 mL at 08/23/16 2300  Objective:  Temp:  [98.4 F (36.9 C)-99.2 F (37.3 C)] 99 F (37.2 C) (04/07 0959) Pulse Rate:  [78-92] 88 (04/07 0959) Resp:  [18] 18 (04/07 0959) BP: (130-150)/(82-99) 130/83  (04/07 0959) SpO2:  [93 %-100 %] 93 % (04/07 0959)  General: WDWN sitting up in bed in NAD. Alert, oriented x4. Speech is clear without dysarthria. Affect is bright. Comportment is normal.  HEENT: Neck is supple without lymphadenopathy. Mucous membranes are moist and the oropharynx is clear. Sclerae are anicteric. There is no conjunctival injection.  CV: Regular, no murmur. Carotid pulses are 2+ and symmetric with no bruits. Distal pulses 2+ and symmetric.  Lungs: CTAB  Neuro: MS: As noted above.  CN: Pupils are equal and reactive from 3-->2 mm bilaterally. She now has full conjugate excursions with both eyes though there is breakup of smooth pursuits of the right eye. Facial sensation is intact to light touch but she reports decreased pinprick over the left cheek. Face is symmetric at rest with normal strength and mobility. Hearing is intact to conversational voice. Voice is normal in tone and quality. Palate elevates symmetrically. Uvula is midline. Bilateral SCM and trapezii are 5/5. Tongue is midline with normal bulk and mobility.  Motor: Normal bulk, tone, and strength throughout. No pronator drift. No tremor or other abnormal movements are observed.  Sensation: Intact to light touch. Pinprick is reduced over the left arm.  DTRs: 2+, symmetric. Toes are downgoing bilaterally. No pathological reflexes.  Coordination: Finger-to-nose is without dysmetria bilaterally.    Labs: Lab Results  Component Value Date   WBC 15.3 (H) 08/23/2016   HGB 11.8 (L) 08/23/2016   HCT 36.9 08/23/2016   PLT 244 08/23/2016   GLUCOSE 109 (H) 08/24/2016  ALT 29 08/15/2016   AST 26 08/15/2016   NA 138 08/24/2016   K 4.1 08/24/2016   CL 104 08/24/2016   CREATININE 0.57 08/24/2016   BUN 14 08/24/2016   CO2 25 08/24/2016   CBC Latest Ref Rng & Units 08/23/2016 08/22/2016 08/21/2016  WBC 4.0 - 10.5 K/uL 15.3(H) 21.9(H) 12.4(H)  Hemoglobin 12.0 - 15.0 g/dL 11.8(L) 12.7 12.9  Hematocrit 36.0 - 46.0 % 36.9 38.8  39.3  Platelets 150 - 400 K/uL 244 249 256    No results found for: HGBA1C Lab Results  Component Value Date   ALT 29 08/15/2016   AST 26 08/15/2016   ALKPHOS 59 08/15/2016   BILITOT 0.6 08/15/2016   Mg 2.4 NMO antibodies negative  Radiology:  There is no new neuroimaging for review.   A/P:   1. MS: This is a new diagnosis, consistent with the relapsing-remitting form. Labs for mimics (ANA, B12, HIV, Scl-70, NMO abs, ACE) have been unremarkable. She has had good response to IV Solumedrol and today will be her final dose. She is scheduled to f/u with Dr. Epimenio Foot (MS neurologist) on 4/19 to discuss disease modifying therapy.   2. R INO: This has improved significantly with improved movement of the R eye. Diplopia persists but is better. Continue alternate eye patching for comfort.   3. R Numbness: She has no subjective changes today but still shows reduced pinprick over the R face and arm. No further intervention, follow.   4. Headache: Resolved, no issues at this time.   I discussed the above with the patient and family at the bedside. She is in agreement with the plan as noted. They were given the opportunity to ask any questions and these were addressed to their satisfaction. She was advised that she may experience transient recrudescence of symptoms with fatigue, stress, and heat and that this is normal.   I discussed the above with Dr. Janee Morn. She is OK for discharge from my perspective.   Rhona Leavens, MD [facility]

## 2016-08-27 ENCOUNTER — Emergency Department (HOSPITAL_COMMUNITY)
Admission: EM | Admit: 2016-08-27 | Discharge: 2016-08-27 | Disposition: A | Discharge status: Home / Self Care | Payer: 59 | Attending: Emergency Medicine | Admitting: Emergency Medicine

## 2016-08-27 ENCOUNTER — Telehealth: Payer: Self-pay | Admitting: Neurology

## 2016-08-27 DIAGNOSIS — Z7982 Long term (current) use of aspirin: Secondary | ICD-10-CM | POA: Insufficient documentation

## 2016-08-27 DIAGNOSIS — Z79899 Other long term (current) drug therapy: Secondary | ICD-10-CM | POA: Insufficient documentation

## 2016-08-27 DIAGNOSIS — R519 Headache, unspecified: Secondary | ICD-10-CM

## 2016-08-27 DIAGNOSIS — R51 Headache: Secondary | ICD-10-CM | POA: Diagnosis not present

## 2016-08-27 DIAGNOSIS — I10 Essential (primary) hypertension: Secondary | ICD-10-CM | POA: Diagnosis not present

## 2016-08-27 LAB — CBG MONITORING, ED: Glucose-Capillary: 246 mg/dL — ABNORMAL HIGH (ref 65–99)

## 2016-08-27 MED ORDER — DIPHENHYDRAMINE HCL 50 MG/ML IJ SOLN
25.0000 mg | Freq: Once | INTRAMUSCULAR | Status: AC
  Administered 2016-08-27: 25 mg via INTRAVENOUS
  Filled 2016-08-27: qty 1

## 2016-08-27 MED ORDER — METOCLOPRAMIDE HCL 5 MG/ML IJ SOLN
10.0000 mg | Freq: Once | INTRAMUSCULAR | Status: AC
  Administered 2016-08-27: 10 mg via INTRAVENOUS
  Filled 2016-08-27: qty 2

## 2016-08-27 MED ORDER — MORPHINE SULFATE (PF) 4 MG/ML IV SOLN
4.0000 mg | Freq: Once | INTRAVENOUS | Status: AC
  Administered 2016-08-27: 4 mg via INTRAVENOUS
  Filled 2016-08-27: qty 1

## 2016-08-27 MED ORDER — IBUPROFEN 600 MG PO TABS: 600.0000 mg | ORAL_TABLET | Freq: Four times a day (QID) | ORAL | 0 refills | Status: DC | PRN

## 2016-08-27 MED ORDER — SODIUM CHLORIDE 0.9 % IV BOLUS (SEPSIS)
1000.0000 mL | Freq: Once | INTRAVENOUS | Status: AC
  Administered 2016-08-27: 1000 mL via INTRAVENOUS

## 2016-08-27 MED ORDER — KETOROLAC TROMETHAMINE 30 MG/ML IJ SOLN
30.0000 mg | Freq: Once | INTRAMUSCULAR | Status: AC
  Administered 2016-08-27: 30 mg via INTRAVENOUS
  Filled 2016-08-27: qty 1

## 2016-08-27 NOTE — ED Provider Notes (Signed)
[facility]-EMERGENCY DEPT Provider Note   CSN: [id number] Arrival date & time: 08/27/16  4098     History   Chief Complaint Chief Complaint  Patient presents with  . Headache    HPI Amanda Horne is a 31 y.o. female.  HPI  31 y.o. female with a hx of newly diagnosed MS, presents to the Emergency Department today complaining of gradual headache this AM. Notes waking up and then feeling headache. States it is "in the middle of my head." Notes pain 10/10. Attempted hydrocodone with minimal relief. Noted right arm numbness/weakness during episode that resolved. No N/V. No vision changes other than diplopia from previous admission.  Per chart review, pt recently discharged on 08-24-16 after admission on 08-20-16 with newly diagnosed MS per MRI.  Patient was placed on high-dose IV Solu-Medrol 1000 mg daily and treated for total of 5 days with resolution of headache. Pt with follow up with Dr. Epimenio Foot on 09/05/16. Also follow up with PCP tomorrow. No CP/SOB/ABD pain. No fevers. No numbness/tingling. No decrease in strength. No other symptoms noted.   MRI Brain w/ w/o Contrast on 08-20-16: IMPRESSION: At least 2 infratentorial and greater than 10 supratentorial white matter lesions compatible with moderate chronic demyelination. Superimposed RIGHT temporal and LEFT frontal lobe acute enhancing demyelinating plaques.  Past Medical History:  Diagnosis Date  . Abnormal Pap smear   . Herpes   . Hx of varicella     Patient Active Problem List   Diagnosis Date Noted  . Essential hypertension   . Numbness   . Multiple sclerosis (HCC) 08/20/2016  . Facial numbness 08/20/2016  . Vertigo 08/20/2016  . Headache 08/20/2016  . Double vision     Past Surgical History:  Procedure Laterality Date  . COLPOSCOPY    . NO PAST SURGERIES    . WISDOM TOOTH EXTRACTION      OB History    Gravida Para Term Preterm AB Living   3 3 3     3    SAB TAB Ectopic Multiple Live Births           3       Home  Medications    Prior to Admission medications   Medication Sig Start Date End Date Taking? Authorizing Provider  aspirin-acetaminophen-caffeine (EXCEDRIN MIGRAINE) [phone] MG tablet Take 1 tablet by mouth every 6 (six) hours as needed for headache.    Historical Provider, MD  HYDROcodone-acetaminophen (NORCO/VICODIN) 5-325 MG tablet Take 1-2 tablets by mouth every 4 (four) hours as needed for moderate pain. 08/24/16   Rodolph Bong, MD  ibuprofen (ADVIL,MOTRIN) 200 MG tablet Take 800 mg by mouth every 6 (six) hours as needed (migraine).    Historical Provider, MD  MedroxyPROGESTERone Acetate (DEPO-PROVERA IM) Inject into the muscle every 3 (three) months.    Historical Provider, MD  ondansetron (ZOFRAN ODT) 8 MG disintegrating tablet Take 1 tablet (8 mg total) by mouth every 8 (eight) hours as needed for nausea or vomiting. 08/16/16   Margarita Grizzle, MD  pantoprazole (PROTONIX) 40 MG tablet Take 1 tablet (40 mg total) by mouth daily at 6 (six) AM. 08/25/16   Rodolph Bong, MD  polyethylene glycol (MIRALAX / Ethelene Hal) packet Take 17 g by mouth daily. 08/25/16   Rodolph Bong, MD    Family History Family History  Problem Relation Age of Onset  . Hypertension Mother   . Diabetes Mother   . Diabetes Sister   . Other Paternal Uncle  Wiskott Aldnch syndrome, died as a child    Social History Social History  Substance Use Topics  . Smoking status: Never Smoker  . Smokeless tobacco: Never Used  . Alcohol use No     Allergies   Patient has no known allergies.   Review of Systems Review of Systems ROS reviewed and all are negative for acute change except as noted in the HPI.  Physical Exam Updated Vital Signs BP 132/73 (BP Location: Right Arm)   Pulse 90   Temp 97.3 F (36.3 C) (Oral)   Resp 16   Wt 83 kg   SpO2 99%   BMI 32.42 kg/m   Physical Exam  Constitutional: She is oriented to person, place, and time. Vital signs are normal. She appears well-developed and  well-nourished.  HENT:  Head: Normocephalic and atraumatic.  Right Ear: Hearing normal.  Left Ear: Hearing normal.  Eyes: Conjunctivae and EOM are normal. Pupils are equal, round, and reactive to light.  Neck: Normal range of motion. Neck supple.  Cardiovascular: Normal rate, regular rhythm, normal heart sounds and intact distal pulses.   Pulmonary/Chest: Effort normal and breath sounds normal.  Abdominal: Soft. Bowel sounds are normal. There is no tenderness.  Musculoskeletal: Normal range of motion.  Neurological: She is alert and oriented to person, place, and time. She has normal strength. No cranial nerve deficit or sensory deficit.  Cranial Nerves:  II: Pupils equal, round, reactive to light III,IV, VI: ptosis not present, extra-ocular motions intact bilaterally  V,VII: smile symmetric, facial light touch sensation equal VIII: hearing grossly normal bilaterally  IX,X: midline uvula rise  XI: bilateral shoulder shrug equal and strong XII: midline tongue extension  Skin: Skin is warm and dry.  Psychiatric: She has a normal mood and affect. Her speech is normal and behavior is normal. Thought content normal.  Nursing note and vitals reviewed.  ED Treatments / Results  Labs (all labs ordered are listed, but only abnormal results are displayed) Labs Reviewed  CBG MONITORING, ED - Abnormal; Notable for the following:       Result Value   Glucose-Capillary 246 (*)    All other components within normal limits   EKG  EKG Interpretation None      Radiology No results found.  Procedures Procedures (including critical care time)  Medications Ordered in ED Medications  sodium chloride 0.9 % bolus 1,000 mL (not administered)  metoCLOPramide (REGLAN) injection 10 mg (not administered)  diphenhydrAMINE (BENADRYL) injection 25 mg (not administered)  ketorolac (TORADOL) 30 MG/ML injection 30 mg (not administered)   Initial Impression / Assessment and Plan / ED Course  I have  reviewed the triage vital signs and the nursing notes.  Pertinent labs & imaging results that were available during my care of the patient were reviewed by me and considered in my medical decision making (see chart for details).  Final Clinical Impressions(s) / ED Diagnoses  {I have reviewed and evaluated the relevant laboratory values.   {I have reviewed the relevant previous healthcare records.  {I obtained HPI from historian. {Patient discussed with supervising physician.  ED Course:  Assessment: Patient is a 31 y.o. female hx MS recently diagnosed. that presents with headache this AM.  Newly diagnosed MS, presents to the Emergency Department today complaining of gradual headache this AM. Notes waking up and then feeling headache. States it is "in the middle of my head." Notes pain 10/10. Attempted hydrocodone with minimal relief. Noted right arm numbness/weakness during episode that resolved.  No N/V. No vision changes other than diplopia from previous admission.  Per chart review, pt recently discharged on 08-24-16 after admission on 08-20-16 with newly diagnosed MS per MRI.  Patient was placed on high-dose IV Solu-Medrol 1000 mg daily and treated for total of 5 days with resolution of headache. Pt with follow up with Dr. Epimenio Foot on 09/05/16. Also follow up with PCP tomorrow.. Patient is without high-risk features of headache including: Sudden onset/thunderclap HA, No similar headache in past, Altered mental status, Accompanying seizure, Headache with exertion, Age > 50, History of immunocompromise, Neck or shoulder pain, Fever, Use of anticoagulation, Family history of spontaneous SAH, Concomitant drug use, Toxic exposure.  Patient has a normal complete neurological exam, normal vital signs, normal level of consciousness, no signs of meningismus, is well-appearing/non-toxic appearing, no signs of trauma. No papilledema, no pain over the temporal arteries. Imaging with CT/MRI not indicated given history and  physical exam findings. No dangerous or life-threatening conditions suspected or identified by history, physical exam, and by work-up. Elevated CBG likely due to steroid usage. No hx DM. No indications for hospitalization identified. Consulted with Neurology (Dr. Lavonna Monarch) believes likely complex migraine due to no focal neurological deficits. Unlikely MS exacerbation. Given migraine cocktail with relief of symptoms. Will withhold steroids and have patient follow up with PCP tomorrow. Strict return precautions given.     Disposition/Plan:  DC Home Additional Verbal discharge instructions given and discussed with patient.  Pt Instructed to f/u with neurology in the next week for evaluation and treatment of symptoms. Return precautions given Pt acknowledges and agrees with plan  Supervising Physician Cathren Laine, MD  Final diagnoses:  Nonintractable headache, unspecified chronicity pattern, unspecified headache type    New Prescriptions New Prescriptions   No medications on file     Audry Pili, PA-C 08/27/16 1214    Cathren Laine, MD 08/27/16 [phone]

## 2016-08-27 NOTE — Discharge Instructions (Signed)
Please read and follow all provided instructions.  Your diagnoses today include:  1. Nonintractable headache, unspecified chronicity pattern, unspecified headache type     Tests performed today include: CT of your head which was normal and did not show any serious cause of your headache Vital signs. See below for your results today.   Medications:  In the Emergency Department you received: Reglan - antinausea/headache medication Benadryl - antihistamine to counteract potential side effects of reglan Toradol - NSAID medication similar to ibuprofen  Take any prescribed medications only as directed.  Additional information:  Follow any educational materials contained in this packet.  You are having a headache. No specific cause was found today for your headache. It may have been a migraine or other cause of headache. Stress, anxiety, fatigue, and depression are common triggers for headaches.   Your headache today does not appear to be life-threatening or require hospitalization, but often the exact cause of headaches is not determined in the emergency department. Therefore, follow-up with your doctor is very important to find out what may have caused your headache and whether or not you need any further diagnostic testing or treatment.   Sometimes headaches can appear benign (not harmful), but then more serious symptoms can develop which should prompt an immediate re-evaluation by your doctor or the emergency department.  BE VERY CAREFUL not to take multiple medicines containing Tylenol (also called acetaminophen). Doing so can lead to an overdose which can damage your liver and cause liver failure and possibly death.   Follow-up instructions: Please follow-up with your primary care provider in the next 3 days for further evaluation of your symptoms.   Return instructions:  Please return to the Emergency Department if you experience worsening symptoms. Return if the medications do not  resolve your headache, if it recurs, or if you have multiple episodes of vomiting or cannot keep down fluids. Return if you have a change from the usual headache. RETURN IMMEDIATELY IF you: Develop a sudden, severe headache Develop confusion or become poorly responsive or faint Develop a fever above 100.29F or problem breathing Have a change in speech, vision, swallowing, or understanding Develop new weakness, numbness, tingling, incoordination in your arms or legs Have a seizure Please return if you have any other emergent concerns.  Additional Information:  Your vital signs today were: BP 132/73 (BP Location: Right Arm)    Pulse 90    Temp 97.3 F (36.3 C) (Oral)    Resp 16    Wt 83 kg    SpO2 99%    BMI 32.42 kg/m  If your blood pressure (BP) was elevated above 135/85 this visit, please have this repeated by your doctor within one month. --------------

## 2016-08-27 NOTE — Telephone Encounter (Signed)
I have spoken with mother Amanda Horne and given pt. appt. 09-02-16, arrival time 2pm.  She will get a reminder call for a 3pm appt., but I have asked her to be here at 2pm. Amanda Horne is agreeable, sts. pt. will be here/fim

## 2016-08-27 NOTE — ED Notes (Signed)
Checked patient blood sugar it was 246 notified RN of blood sugar 

## 2016-08-27 NOTE — ED Triage Notes (Signed)
Pt arrives via EMS from home with severe headache 10/10. Per pt recently diagnosed with MS and discharged from [place] on Saturday. Pt reports taking hydrocodone at 4am with little relief. Pt with panic attack this AM rapid breathing, hypertensive 220/110. Pt also had right arm numbness and weakness during episode. Now resolved. Pt with eye patch due to double vision. Appears weak, in pain. Pt alert, oriented x4, VSS.

## 2016-08-27 NOTE — Telephone Encounter (Addendum)
Pt called today wanting to know if there were any cancellations. I noticed that the appt should be in a 1 hour slot-MSNP. Faith was skyped, advised she would talk with Dr Epimenio Foot, let him look at pt's MRI and she would call her back.

## 2016-08-28 ENCOUNTER — Ambulatory Visit: Payer: 59 | Admitting: Physical Therapy

## 2016-08-28 ENCOUNTER — Ambulatory Visit: Payer: 59 | Attending: Internal Medicine | Admitting: Occupational Therapy

## 2016-08-28 VITALS — BP 148/99

## 2016-08-28 DIAGNOSIS — R278 Other lack of coordination: Secondary | ICD-10-CM | POA: Diagnosis present

## 2016-08-28 DIAGNOSIS — R2689 Other abnormalities of gait and mobility: Secondary | ICD-10-CM | POA: Diagnosis present

## 2016-08-28 DIAGNOSIS — R2681 Unsteadiness on feet: Secondary | ICD-10-CM

## 2016-08-28 DIAGNOSIS — R41842 Visuospatial deficit: Secondary | ICD-10-CM | POA: Diagnosis present

## 2016-08-28 DIAGNOSIS — R29818 Other symptoms and signs involving the nervous system: Secondary | ICD-10-CM

## 2016-08-28 DIAGNOSIS — R208 Other disturbances of skin sensation: Secondary | ICD-10-CM | POA: Diagnosis present

## 2016-08-28 DIAGNOSIS — M6281 Muscle weakness (generalized): Secondary | ICD-10-CM | POA: Insufficient documentation

## 2016-08-28 NOTE — Therapy (Signed)
[facility] [address] [place], [place], [address] Phone: [phone]   Fax:  [phone]  Occupational Therapy Evaluation  Patient Details  Name: ROSAMAE ROCQUE MRN: [id number] Date of Birth: [birth year] Referring Provider: Dr. Ramiro Harvest, Dr Epimenio Foot  Encounter Date: 08/28/2016      OT End of Session - 08/28/16 1433    Visit Number 1   Number of Visits 25   Date for OT Re-Evaluation 11/25/16   Authorization Type UHC   Authorization Time Period 60 visit PT/OT combined   Authorization - Visit Number 1   Authorization - Number of Visits 30   OT Start Time 0807   OT Stop Time 0845   OT Time Calculation (min) 38 min   Activity Tolerance Patient tolerated treatment well   Behavior During Therapy [facility] for tasks assessed/performed      Past Medical History:  Diagnosis Date  . Abnormal Pap smear   . Herpes   . Hx of varicella     Past Surgical History:  Procedure Laterality Date  . COLPOSCOPY    . NO PAST SURGERIES    . WISDOM TOOTH EXTRACTION      Vitals:   08/28/16 1253 08/28/16 1254  BP: (!) 146/105 (!) 148/99  Pt reports she sees her MD this afternoon regarding BP issues.      Subjective Assessment - 08/28/16 1302    Subjective  Pt newly diagnosed with MS   Pertinent History newly diagnosed MS, herpes, varicella   Patient Stated Goals improve balance   Currently in Pain? 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     ADL   Eating/Feeding Modified independent  gets fatigued and food gets stuck   Grooming Modified independent   Upper Body Bathing Supervision/safety  has seat   Lower Body Bathing Supervision/safety   Upper Body Dressing Supervision/safety   Lower Body Dressing Minimal assistance   Toilet Tranfer Modified independent  with walker   Tub/Shower Transfer Supervision/safety     IADL   Shopping Needs to be accompanied on any shopping trip   Light Housekeeping Needs help with all home maintenance tasks   Meal Prep Needs to have meals prepared and served   Medication Management Is responsible for taking medication in correct dosages at correct time     Written Expression   Dominant Hand Left   Handwriting 100% legible     Vision Assessment   Vision Assessment Vision impaired  _ to be further tested in functional context   Tracking/Visual Pursuits Decreased smoothness of horizontal tracking -disconjugate gaze right eye is externally rotated, nystagmus tracking to left   Convergence Impaired - to be further tested in  functional context   Diplopia Assessment Diappears with one eye closed  currently 1 lens over right eye occluded with transpore tape   Patient has diffculty with activities due to visual impairment Reading bills   Comment Pt demonstrates      Cognition   Overall Cognitive Status Within Functional Limits for tasks assessed     Observation/Other Assessments   Focus on Therapeutic Outcomes (FOTO)  N/A     Sensation   Light Touch Impaired by gross assessment  RUE     Coordination   Fine Motor Movements are Fluid and Coordinated --  mild decrease for LUE   9 Hole Peg Test Right;Left   Right 9 Hole Peg Test 37.44 secs  decreased control   Left 9 Hole Peg Test 32.60 secs     ROM / Strength   AROM / PROM / Strength AROM;Strength     AROM    Overall AROM  Within functional limits for tasks performed     Strength   Overall Strength Deficits   Overall Strength Comments RUE grossly 4/5, LUE4-/5 to 4/5     Hand Function   Right Hand Grip (lbs) 50 lbs   Left Hand Grip (lbs) 60 lbs                           OT Short Term Goals - 08/28/16 1303      OT SHORT TERM GOAL #1   Title I with HEP- due 10/11/16   Time 6   Period Weeks   Status New     OT SHORT TERM GOAL #2   Title Pt will verbalize understanding of compensatory strategies for visual impairments   Time 6   Period Weeks   Status New     OT SHORT TERM GOAL #3   Title Test standing functional reach and set goal   Time 6   Period Weeks   Status New     OT SHORT TERM GOAL #4   Title Pt will perform all basic ADLS modified independently   Time 6   Period Weeks   Status New     OT SHORT TERM GOAL #5   Title Pt will perform light home management/ cooking  at a supervision level.   Time 6   Period Weeks   Status New     OT SHORT TERM GOAL #6   Title Pt will perform tabletop scanning and reading tasks without reports of diplopia.   Time 6   Period Weeks   Status New           OT Long Term Goals - 08/28/16 1305      OT LONG TERM GOAL #1   Title Pt will demonstrate adequate UE strength to retrieve a 3 lbs weight from ovehead shelf with each UE individually x 5 reps without dropping.-due 11/27/16   Time 12   Period Weeks   Status New     OT LONG TERM GOAL #2   Title Pt will perform mod complex home management/ cooking at a modified indepdnet level without LOB.   Time 12   Period Weeks   Status New     OT LONG TERM GOAL #3   Title Pt will verbalize understanding of energy conservation/ activity modification for ADLs/IADLs.   Time 12   Period Weeks   Status New     OT LONG TERM GOAL #4   Title Pt will demonstrate improved LUE  functional use as evidenced by improving  LUE 9 hole peg test score by 3 secs.   Time 12   Period  Weeks   Status New     OT LONG TERM GOAL #5   Title Pt will demonstrate ability to navigate a busy environment and locate items with 95% or better accuracy and no reports of diplopia.   Time 12   Period Weeks   Status New               Plan - 08/28/16 1253    Clinical Impression Statement Pt is a 31 y.o. female who was hospitalized on 08/15/16 with suspected viral ilness with HA, dizziness, and double vision. MRI revealed lesion right temporal lobe and left frontal lobe with multiple white matter lesions compatible with moderate chronic demylination. Pt was treated with solumedrol for MS exacerbation. Pt presents with visual impairments, decreased balance, decreased strength, decreased coordination and sensory impairments which impede performance of daily activities. Pt can benefit from skilled occupational therapy to maximize pt's safety and independence with ADLs/ IADLS and to maintain quality of life. 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   Rehab Potential Good   OT Frequency 2x / week  plus eval   OT Duration 12 weeks   OT Treatment/Interventions Self-care/ADL training;Patient/family education;Balance training;Therapeutic exercises;Ultrasound;Therapeutic exercise;Therapeutic activities;Cognitive remediation/compensation;Passive range of motion;Neuromuscular education;Cryotherapy;Functional Mobility Training;Energy conservation;Manual Therapy;Visual/perceptual remediation/compensation   Plan educate in vision HEP and adjust tape on glasses   Consulted and Agree with Plan of Care Patient;Family member/caregiver   Family Member Consulted mother      Patient will benefit from skilled therapeutic intervention in order to improve the following deficits and impairments:  Abnormal gait, Decreased knowledge of use of DME, Impaired vision/preception, Impaired sensation, Decreased mobility, Decreased coordination, Decreased activity  tolerance, Decreased endurance, Decreased range of motion, Decreased strength, Decreased balance, Decreased knowledge of precautions, Decreased safety awareness, Difficulty walking, Impaired UE functional use  Visit Diagnosis: Muscle weakness (generalized) - Plan: Ot plan of care cert/re-cert  Other lack of coordination - Plan: Ot plan of care cert/re-cert  Visuospatial deficit - Plan: Ot plan of care cert/re-cert  Other abnormalities of gait and mobility - Plan: Ot plan of care cert/re-cert  Unsteadiness on feet - Plan: Ot plan of care cert/re-cert  Other disturbances of skin sensation - Plan: Ot plan of care cert/re-cert    Problem List Patient Active Problem List   Diagnosis Date Noted  . Essential hypertension   . Numbness   . Multiple sclerosis (HCC) 08/20/2016  . Facial numbness 08/20/2016  . Vertigo 08/20/2016  . Headache 08/20/2016  . Double vision     RINE,KATHRYN 08/28/2016, 2:34 PM Keene Breath, OTR/L [phone] Phone: [phone] 2:35 PM 08/28/16 [facility] [address] [place], [place], [address] Phone: [phone]   Fax:  [phone]  Name: VARVARA LEGAULT MRN: [id number] Date of Birth: 03/26/1986

## 2016-08-28 NOTE — Therapy (Signed)
[facility] [address] [place], [place], [address] Phone: [phone]   Fax:  [phone]  Physical Therapy Evaluation  Patient Details  Name: Amanda Horne MRN: [id number] Date of Birth: 1985/12/07 Referring Provider: Ramiro Harvest  ((Hospital)-pt to follow up with Dr. Epimenio Foot)  Encounter Date: 08/28/2016      PT End of Session - 08/28/16 0946    Visit Number 1   Number of Visits 17   Date for PT Re-Evaluation 10/27/16   Authorization Type UHC-60 visit limit combined PT and OT   PT Start Time 0851   PT Stop Time 0930   PT Time Calculation (min) 39 min   Activity Tolerance Patient limited by fatigue;Treatment limited secondary to medical complications (Comment)  Blood pressure 145/107   Behavior During Therapy [facility] for tasks assessed/performed      Past Medical History:  Diagnosis Date  . Abnormal Pap smear   . Herpes   . Hx of varicella     Past Surgical History:  Procedure Laterality Date  . COLPOSCOPY    . NO PAST SURGERIES    . WISDOM TOOTH EXTRACTION      There were no vitals filed for this visit.       Subjective Assessment - 08/28/16 0855    Subjective Pt diagnosed with MS on 08/20/16, with 5 day hospitalization for steroids.  Prior to that, she had nausea, dizziness, feeling like she was on a roller coaster.  She currently is wearing tape over R eyeglasses due to R eye wandering.  She feels she has had some symptoms in the past 2 years, but she just didn't realize what was fully going on.  Pt to see Dr. Epimenio Foot on Monday.  Feels balance is different, but no falls.   Patient is accompained by: Family member  Mother   Patient Stated Goals Pt's goals for PT are to function better with walking, to get stronger, to not use walker.  (Has been using walker since hospitalization)   Currently in Pain? No/denies  Recent headaches            [facility] PT Assessment - 08/28/16 0901      Assessment    Medical Diagnosis MS   Referring Provider Ramiro Harvest   (Hospital)-pt to follow up with Dr. Epimenio Foot   Onset Date/Surgical Date 08/20/16     Precautions   Precautions Fall   Precaution Comments R eyeglasses taped, currently not driving     Balance Screen   Has the patient fallen in the past 6 months No   Has the patient had a decrease in activity level because of a fear of falling?  Yes   Is the patient reluctant to leave their home because of a fear of falling?  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[employer]     Observation/Other  Assessments   Focus on Therapeutic Outcomes (FOTO)  NA     Sensation   Light Touch Appears Intact  to light touch bilateral lower extremities     Posture/Postural Control   Posture/Postural Control Postural limitations   Postural Limitations Rounded Shoulders     ROM / Strength   AROM / PROM / Strength Strength     Strength   Overall Strength Deficits   Overall Strength Comments Grossly tested bilateral lower extremities 4/5     Transfers   Transfers Sit to Stand;Stand to Sit   Sit to Stand 5: Supervision;With upper extremity assist;From chair/3-in-1  Pushes up to stand with hands on walker   Stand to Sit 5: Supervision;With upper extremity assist;To chair/3-in-1     Ambulation/Gait   Ambulation/Gait Yes   Ambulation/Gait Assistance 5: Supervision   Ambulation Distance (Feet) 120 Feet   Assistive  device Rolling walker   Gait Pattern Step-through pattern;Decreased step length - right;Decreased stance time - right;Decreased dorsiflexion - right;Right genu recurvatum;Poor foot clearance - right   Ambulation Surface Level;Indoor   Gait velocity 27.57 sec = 1.19 ft/sec     Standardized Balance Assessment   Standardized Balance Assessment Timed Up and Go Test     Timed Up and Go Test   Normal TUG (seconds) 25.66   TUG Comments Scores >13.5 sec indicate increased fall risk            Vestibular Assessment - 08/28/16 0912      Vestibular Assessment   General Observation Pt describes nausea, vomiting, dizziness with any movement, head spinning the week prior to 08/20/16.  Resolved during hospitalization 08/20/16     Symptom Behavior   Type of Dizziness --  Head spinning-like on a roller coaster   Frequency of Dizziness With any position changes   Aggravating Factors --  Any position changes, lying flat made worse   Relieving Factors --  Position changes, finding right position     Occulomotor Exam   Occulomotor Alignment Abnormal  R eyeglasses taped due to R eye wandering     Vestibulo-Occular Reflex   VOR 1 Head Only (x 1 viewing) nystagmus with head turn to L    Comment No provocation of symptoms during assessment                       PT Education - 08/28/16 0941    Education provided Yes   Education Details Instructed to rest, avoid over-fatigue; continue to use RW as well as correct hand placement with transfers; pt questions about using work-out equipment in home-PT instructs to hold off on use of exercise equipment until therapy can progress with appropriates exercise and instruct in appropriate use of exercise machines   Person(s) Educated Patient;Parent(s)   Methods Explanation   Comprehension Verbalized understanding          PT Short Term Goals - 08/28/16 0953      PT SHORT TERM GOAL #1   Title Pt will be independent with HEP for improved  balance, strength and gait.  TARGET 09/27/16   Time 4   Period Weeks   Status New     PT SHORT TERM GOAL #2   Title Pt will improve TUG score to less than or equal to 20 seconds for decreased fall risk.   Time 4   Period Weeks   Status New     PT SHORT TERM GOAL #3   Title Berg Balance test to be  assessed, with score to improve by at least 8 points for decreased fall risk.   Time 4   Period Weeks   Status New     PT SHORT TERM GOAL #4   Title Pt will perform sit<>stand transfers with minimal to no UE support, 8 of 10 trials, for improved transfer efficiency and functional strengthening.   Time 4   Period Weeks   Status New     PT SHORT TERM GOAL #5   Title Pt will verbalize understanding of local MS resources.   Time 4   Period Weeks   Status New           PT Long Term Goals - 08/28/16 0955      PT LONG TERM GOAL #1   Title Pt will verbalize understanding of fall prevention in home environment.  TARGET 10/27/16   Time 8   Period Weeks   Status New     PT LONG TERM GOAL #2   Title Pt will improve TUG score to less than or equal to 13.5 seconds for decreased fall risk.   Time 8   Period Weeks   Status New     PT LONG TERM GOAL #3   Title Pt will improve gait velocity to at least 1.8 ft/sec for decreased fall risk, improved efficiency and safety with gait.   Time 8   Period Weeks   Status New     PT LONG TERM GOAL #4   Title Pt will ambulate at least 500 ft, least restrictive assistive device, modified independently, for improved household and community gait.   Time 8   Period Weeks   Status New     PT LONG TERM GOAL #5   Title Pt will negotiate at least 4 steps, using one handrail, modified independently, for improved stair negotiation for home.   Time 8   Period Weeks   Status New               Plan - 08/28/16 0947    Clinical Impression Statement Pt is a 31 year old female with recent diagnosis of MS, who was hospitalized on 08/20/16 for 5 days  for steroid therapy.  Prior to MS diagnosis on 08/20/16, pt had at least one week of nausea, vomiting, dizziness with any type of movement activities, R sided weakness.  Prior to this, she was independent, worked full time as a Engineer, civil (consulting).  Pt presents with decreased balance, slowed mobility, difficulty walking, c/o fatigue, decreased strength, vision changes, and decreased activity tolerance.  See EPIC for PMH.  Pt's presentation is evolving, with recent MS diagnosis and pt seeing neurologist on 09/02/16.  Pt would benefit from skilled physical therapy to address the above stated deficits to improve functional mobility and decrease risk of falls to allow patient to participate fully in household, family, work activities inside and outside of the home.   Rehab Potential Good   PT Frequency 2x / week   PT Duration 8 weeks  plus evaluation   PT Treatment/Interventions ADLs/Self Care Home Management;Functional mobility training;Stair training;Gait training;DME Instruction;Therapeutic activities;Therapeutic exercise;Balance training;Neuromuscular re-education;Patient/family education;Orthotic Fit/Training   PT Next Visit Plan Assess Berg Balance test, assess dizziness c/o if she has had other episodes; initiate HEP for strengthening, balance   Consulted and Agree with Plan of Care Patient;Family member/caregiver   Family Member Consulted Mother      Patient will benefit from skilled therapeutic intervention in order to improve the following deficits and impairments:  Abnormal  gait, Decreased activity tolerance, Decreased balance, Decreased mobility, Decreased knowledge of use of DME, Decreased endurance, Decreased strength, Difficulty walking, Postural dysfunction, Impaired vision/preception  Visit Diagnosis: Other abnormalities of gait and mobility  Muscle weakness (generalized)  Unsteadiness on feet  Other symptoms and signs involving the nervous system     Problem List Patient Active Problem List    Diagnosis Date Noted  . Essential hypertension   . Numbness   . Multiple sclerosis (HCC) 08/20/2016  . Facial numbness 08/20/2016  . Vertigo 08/20/2016  . Headache 08/20/2016  . Double vision     Hiran Leard W. 08/28/2016, 10:02 AM  Gean Maidens., PT  [facility] [address] [place], [place], [address] Phone: [phone]   Fax:  [phone]  Name: Amanda Horne MRN: [id number] Date of Birth: 01-27-1986

## 2016-09-02 ENCOUNTER — Ambulatory Visit: Payer: Self-pay | Admitting: Neurology

## 2016-09-02 ENCOUNTER — Telehealth: Payer: Self-pay | Admitting: *Deleted

## 2016-09-02 NOTE — Telephone Encounter (Signed)
Left message letting her know our office is without power.  Provided our number to call back to reschedule. 

## 2016-09-03 ENCOUNTER — Ambulatory Visit (INDEPENDENT_AMBULATORY_CARE_PROVIDER_SITE_OTHER): Payer: 59 | Admitting: Neurology

## 2016-09-03 ENCOUNTER — Encounter: Payer: Self-pay | Admitting: Neurology

## 2016-09-03 VITALS — BP 128/85 | HR 105 | Resp 16 | Ht 63.0 in | Wt 182.0 lb

## 2016-09-03 DIAGNOSIS — R269 Unspecified abnormalities of gait and mobility: Secondary | ICD-10-CM | POA: Diagnosis not present

## 2016-09-03 DIAGNOSIS — R200 Anesthesia of skin: Secondary | ICD-10-CM

## 2016-09-03 DIAGNOSIS — Z79899 Other long term (current) drug therapy: Secondary | ICD-10-CM | POA: Insufficient documentation

## 2016-09-03 DIAGNOSIS — H532 Diplopia: Secondary | ICD-10-CM | POA: Diagnosis not present

## 2016-09-03 DIAGNOSIS — R42 Dizziness and giddiness: Secondary | ICD-10-CM

## 2016-09-03 DIAGNOSIS — G35 Multiple sclerosis: Secondary | ICD-10-CM | POA: Diagnosis not present

## 2016-09-03 MED ORDER — LORAZEPAM 1 MG PO TABS: ORAL_TABLET | ORAL | 0 refills | Status: DC

## 2016-09-03 NOTE — Progress Notes (Signed)
[facility]  PATIENT: Amanda Horne DOB: 05-14-1986  REFERRING DOCTOR OR PCP:  Elfredia Nevins SOURCE: patient, notes from Dr. Sherwood Gambler, results on EMR, MRI images on PACS  _________________________________   HISTORICAL  CHIEF COMPLAINT:  Chief Complaint  Patient presents with  . Multiple Sclerosis    Amanda Horne is here with her mother Liborio Nixon, and sister Crystal, to confirm MS dx.  Sts. on 08-15-16, she began having right sided facial numbness, h/a, vertigo.  MRI on 08-20-16 is suggestive of MS. She was admitted to [facility] and received 5 days of IV steroids.  Now c/o right arm and left hand numbness as welll. No LP done./fim    HISTORY OF PRESENT ILLNESS:  I had the pleasure seeing you patient, Amanda Horne, at [facility] for neurologic consultation regarding her multiple sclerosis.  In late March, 2018, she had severe vomiting and dizziness.   She went to the ED and was given fluids and Zofran.   A couple days later, she noes the right eye wandered to the right and the left eye had jerking.   She also had right hand numbness and slurred speech.     She went back to the ED 08/20/2016 and MRI was worrisome for MS.   She was admitted and received 5 days of IV Solu-Medrol.     She improved but not to baseline.    She had headaches and returned to the ED and BP was elevated.   She was started on Losartan.       In retrospect, in 2016,  she had phasic muscle spasms associated with weakness on her right side lasting 30-90 seconds.   As she was having a lot of stress at the time, anxiety was first suspected.   Symptoms improved over a few weeks but she has had occasional spasms.    Yesterday, the left arm started to have numbness,    Currently, she is noting some difficulty with her balance. She veers to the right as she walks. She is using a walker for safety. There is no numbness or weakness in her legs. She does have numbness in the arms, right since her  symptoms began couple weeks ago and on the left since yesterday. She notes a little bit of clumsiness in the hands. There is no weakness in the arms.  She continues to have difficulty with her vision.  Her diplopia is much better but she still notes double vision in all gazes, worse when she looks to the right. She has some nystagmus when she looks to the left.  She has never had optic neuritis symptoms per  She denies any significant problems with bladder or bowel function.  She has felt more fatigued recently. She is sleeping okay most nights.  She denies depression but has had some anxiety with all her symptoms over the last month. She denies any cognitive issues.  I personally reviewed the MRI of the brain dated 08/20/2016. In the posterior fossa there are right cerebellar focus adjacent to the fourth ventricle and a left posterior pontomedullary focus.   There are many T2/FLAIR hyperintense foci or ventricular, juxtacortical and deep white matter consistent with MS. 2 foci, one in the right temporal region and one in the left frontal region enhance after contrast.5  REVIEW OF SYSTEMS: Constitutional: No fevers, chills, sweats, or change in appetite Eyes: No visual changes, double vision, eye pain Ear, nose and throat: No hearing loss, ear pain, nasal congestion, sore  throat Cardiovascular: No chest pain, palpitations Respiratory: No shortness of breath at rest or with exertion.   No wheezes GastrointestinaI: No nausea, vomiting, diarrhea, abdominal pain, fecal incontinence Genitourinary: No dysuria, urinary retention or frequency.  No nocturia. Musculoskeletal: No neck pain, back pain Integumentary: No rash, pruritus, skin lesions Neurological: as above Psychiatric: No depression at this time.  No anxiety Endocrine: No palpitations, diaphoresis, change in appetite, change in weigh or increased thirst Hematologic/Lymphatic: No anemia, purpura, petechiae. Allergic/Immunologic: No  itchy/runny eyes, nasal congestion, recent allergic reactions, rashes  ALLERGIES: No Known Allergies  HOME MEDICATIONS:  Current Outpatient Prescriptions:  .  HYDROcodone-acetaminophen (NORCO/VICODIN) 5-325 MG tablet, Take 1-2 tablets by mouth every 4 (four) hours as needed for moderate pain., Disp: 20 tablet, Rfl: 0 .  losartan-hydrochlorothiazide (HYZAAR) 100-25 MG tablet, Take 1 tablet by mouth daily., Disp: , Rfl:  .  MedroxyPROGESTERone Acetate (DEPO-PROVERA IM), Inject into the muscle every 3 (three) months., Disp: , Rfl:  .  pantoprazole (PROTONIX) 40 MG tablet, Take 1 tablet (40 mg total) by mouth daily at 6 (six) AM., Disp: 30 tablet, Rfl: 3  PAST MEDICAL HISTORY: Past Medical History:  Diagnosis Date  . Abnormal Pap smear   . Herpes   . Hx of varicella   . Hypertension   . Multiple sclerosis (HCC)   . Vision abnormalities     PAST SURGICAL HISTORY: Past Surgical History:  Procedure Laterality Date  . COLPOSCOPY    . NO PAST SURGERIES    . WISDOM TOOTH EXTRACTION      FAMILY HISTORY: Family History  Problem Relation Age of Onset  . Hypertension Mother   . Diabetes Mother   . Diabetes Sister   . Other Paternal Uncle     Wiskott Aldnch syndrome, died as a child  . Healthy Father     SOCIAL HISTORY:  Social History   Social History  . Marital status: Single    Spouse name: N/A  . Number of children: N/A  . Years of education: N/A   Occupational History  . Not on file.   Social History Main Topics  . Smoking status: Never Smoker  . Smokeless tobacco: Never Used  . Alcohol use No  . Drug use: No  . Sexual activity: Yes    Birth control/ protection: None   Other Topics Concern  . Not on file   Social History Narrative  . No narrative on file     PHYSICAL EXAM  Vitals:   09/03/16 1303  BP: 128/85  Pulse: (!) 105  Resp: 16  Weight: 182 lb (82.6 kg)  Height: 5\' 3"  (1.6 m)    Body mass index is 32.24 kg/m.   General: The patient is  well-developed and well-nourished and in no acute distress  Eyes:  She has astigmatism and funduscopic examination is difficult..  Neck: The neck is supple, no carotid bruits are noted.  The neck is nontender.  Cardiovascular: The heart has a regular rate and rhythm with a normal S1 and S2. There were no murmurs, gallops or rubs. Lungs are clear to auscultation.  Skin: Extremities are without significant edema.  Musculoskeletal:  Back is nontender  Neurologic Exam  Mental status: The patient is alert and oriented x 3 at the time of the examination. The patient has apparent normal recent and remote memory, with an apparently normal attention span and concentration ability.   Speech is normal.  Cranial nerves: Extraocular movements are full.   However, there is slight  reduced left adduction on right gaze and a little nystagmus on left gaze.. Pupils are equal, round, and reactive to light and accomodation.  Visual fields are full.  Facial symmetry is present. There is good facial sensation to soft touch bilaterally.Facial strength is normal.  Trapezius and sternocleidomastoid strength is normal. No dysarthria is noted.  The tongue is midline, and the patient has symmetric elevation of the soft palate. No obvious hearing deficits are noted.  Motor:  Muscle bulk is normal.   Tone is normal. Strength is  5 / 5 in all 4 extremities.   Sensory: Sensory testing is intact to touch and vibration sensation in all 4 extremities.  Coordination: Cerebellar testing reveals slightly reduced right finger-nose-finger and fairly normal heel-to-shin bilaterally.  Gait and station: Station is normal.   Gait is wide and she veers towards the right. She cannot tandem walk.. Romberg is positive.   Reflexes: Deep tendon reflexes are increased throughout and she has spread at both knees and clonus that is nonsustained at the right ankle..   Plantar responses are flexor.    DIAGNOSTIC DATA (LABS, IMAGING,  TESTING) - I reviewed patient records, labs, notes, testing and imaging myself where available.  Lab Results  Component Value Date   WBC 15.3 (H) 08/23/2016   HGB 11.8 (L) 08/23/2016   HCT 36.9 08/23/2016   MCV 75.8 (L) 08/23/2016   PLT 244 08/23/2016      Component Value Date/Time   NA 138 08/24/2016 0523   K 4.1 08/24/2016 0523   CL 104 08/24/2016 0523   CO2 25 08/24/2016 0523   GLUCOSE 109 (H) 08/24/2016 0523   BUN 14 08/24/2016 0523   CREATININE 0.57 08/24/2016 0523   CALCIUM 9.0 08/24/2016 0523   PROT 8.2 (H) 08/15/2016 1736   ALBUMIN 4.4 08/15/2016 1736   AST 26 08/15/2016 1736   ALT 29 08/15/2016 1736   ALKPHOS 59 08/15/2016 1736   BILITOT 0.6 08/15/2016 1736   GFRNONAA >60 08/24/2016 0523   GFRAA >60 08/24/2016 0523    Lab Results  Component Value Date   VITAMINB12 1,718 (H) 08/21/2016        ASSESSMENT AND PLAN  Multiple sclerosis (HCC) - Plan: MR CERVICAL SPINE W WO CONTRAST, Hepatitis B core antibody, total, Hepatitis B surface antigen, Quantiferon tb gold assay (blood), Hepatitis B surface antibody, Hepatic function panel, Stratify JCV Antibody Test ([facility])  Double vision  Vertigo  Numbness - Plan: MR CERVICAL SPINE W WO CONTRAST  Gait disturbance - Plan: MR CERVICAL SPINE W WO CONTRAST  High risk medication use - Plan: Hepatitis B core antibody, total, Hepatitis B surface antigen, Quantiferon tb gold assay (blood), Hepatitis B surface antibody, Hepatic function panel, Stratify JCV Antibody Test ([facility])   In summary, Amanda Horne is a 31 year old woman with a new diagnosis of multiple sclerosis after an exacerbation last month vertigo, diplopia, gait ataxia also had an episode in 2016 sided phasic spasms with weakness consistent with a spinal cord plaque.   Since yesterday she is having new left arm numbness. She is presenting with more aggressive than average features including brainstem/spinal exacerbations, short interval between relapses,  incomplete recovery. MRI shows that she does have enhancing lesions. Due to her more aggressive features, I believe she would do best on a more efficacious medication such as Tysabri or ocrelizumab.  In phase 3 studies, these medications were significantly more efficacious than interferons (and other studies as studies have shown equivalency of interferons and glatiramer).  I  reviewed the pros and cons of these medications and discussed the safety issues including PML, Hep B reactivation and breast cancer.   We will check labwork and after everything is back decide which medication to go on.  Because she is having a new exacerbation I will also have her get 3 days of IV Solu-Medrol starting today.  She will return to see me in 2 months or sooner if there are new or worsening neurologic symptoms.     Tania Perrott A. Epimenio Foot, MD, PhD 09/03/2016, 1:11 PM Certified in Neurology, Clinical Neurophysiology, Sleep Medicine, Pain Medicine and Neuroimaging  [facility] [address], [address] [place], [place] [address] [phone]

## 2016-09-03 NOTE — Telephone Encounter (Signed)
The power has been restored at the office and Dr. Epimenio Foot will be seeing patients this afternoon.  Spoke to patient and Amanda Horne will be at our office at 1pm today.

## 2016-09-04 LAB — HEPATIC FUNCTION PANEL
ALT: 29 IU/L (ref 0–32)
AST: 21 IU/L (ref 0–40)
Albumin: 4.2 g/dL (ref 3.5–5.5)
Alkaline Phosphatase: 56 IU/L (ref 39–117)
Bilirubin Total: 0.3 mg/dL (ref 0.0–1.2)
Bilirubin, Direct: 0.08 mg/dL (ref 0.00–0.40)
TOTAL PROTEIN: 6.8 g/dL (ref 6.0–8.5)

## 2016-09-04 LAB — HEPATITIS B CORE ANTIBODY, TOTAL: Hep B Core Total Ab: NEGATIVE

## 2016-09-04 LAB — HEPATITIS B SURFACE ANTIGEN: HEP B S AG: NEGATIVE

## 2016-09-04 LAB — HEPATITIS B SURFACE ANTIBODY,QUALITATIVE: HEP B SURFACE AB, QUAL: REACTIVE

## 2016-09-05 ENCOUNTER — Ambulatory Visit: Payer: Self-pay | Admitting: *Deleted

## 2016-09-05 ENCOUNTER — Ambulatory Visit: Payer: 59 | Admitting: Neurology

## 2016-09-05 LAB — QUANTIFERON IN TUBE
QFT TB AG MINUS NIL VALUE: 0 [IU]/mL
QUANTIFERON MITOGEN VALUE: 6.32 [IU]/mL
QUANTIFERON NIL VALUE: 0.02 [IU]/mL
QUANTIFERON TB AG VALUE: 0.02 IU/mL
QUANTIFERON TB GOLD: NEGATIVE

## 2016-09-05 LAB — QUANTIFERON TB GOLD ASSAY (BLOOD)

## 2016-09-05 NOTE — NC FL2 (Signed)
Pt is brought back to Infusion suite via wheelchair and with her sister.  At 1521 L IV saline lock dressing clean dry and intact.   IV NS / 1 gram Solumedrol infusing after flush to R forearm 24g catheter saline lock started at 1521.  Ended 1610.  Bp 134/90 and HR 109.  Pt continues with R arm numbness, and L hand numbness.  Had episode of L head pain, lasting for 2 hours this am.  Tolerated the IV solumedrol without issue.  To see Dr. Epimenio Foot after infusion.  [facility] [id number] 10/2019.   Sister with pt.

## 2016-09-06 ENCOUNTER — Telehealth: Payer: Self-pay | Admitting: Neurology

## 2016-09-06 ENCOUNTER — Ambulatory Visit: Payer: 59 | Admitting: Physical Therapy

## 2016-09-06 ENCOUNTER — Ambulatory Visit: Payer: 59 | Admitting: Occupational Therapy

## 2016-09-06 VITALS — BP 148/90

## 2016-09-06 DIAGNOSIS — R208 Other disturbances of skin sensation: Secondary | ICD-10-CM

## 2016-09-06 DIAGNOSIS — R2681 Unsteadiness on feet: Secondary | ICD-10-CM

## 2016-09-06 DIAGNOSIS — R278 Other lack of coordination: Secondary | ICD-10-CM

## 2016-09-06 DIAGNOSIS — R2689 Other abnormalities of gait and mobility: Secondary | ICD-10-CM

## 2016-09-06 DIAGNOSIS — M6281 Muscle weakness (generalized): Secondary | ICD-10-CM

## 2016-09-06 DIAGNOSIS — R29818 Other symptoms and signs involving the nervous system: Secondary | ICD-10-CM

## 2016-09-06 DIAGNOSIS — R41842 Visuospatial deficit: Secondary | ICD-10-CM

## 2016-09-06 NOTE — Telephone Encounter (Signed)
Called by [facility] this morning. Initially they would like [place] to write prescription for her IV infusion. However, when I further discussed with the clinical manager, I was told that pt insurance did not approve home health and only approved for outpt therapy. Therefore, they can not help with the IV infusion. I will send message to Dr. Epimenio Foot and let him know.   Marvel Plan, MD PhD Stroke Neurology 09/06/2016 11:07 AM

## 2016-09-06 NOTE — Patient Instructions (Signed)
Diplopia HEP:  Perform at least 2 times per day. Stop if your eye becomes fatigued or hurts and try again later.  Hold card in front of you where you can make the "A" a single image, move the card closer, then away from you, keep it only within the range that you can keep it as one image  5-10 reps 1-2 x day  Then perform the following:   1. Hold a small object/card in front of you.  Hold it in the middle at arm's length away.    2. Cover your LEFT eye and look at the object with your RIGHT eye.  3. Slowly move the object side to side in front of you while continuing to watch it with your RIGHT eye.  4.  Remember to keep your head still and only move your eye.  5.  Repeat 5-10 times.  6.  Then, move object up and down while watching it 5-10 times.  7. Cover your RIGHT eye and look at the object with your LEFT eye while you repeat #1-6 above.  8.  Now, uncover both eyes and try to focus on the object while holding it in the middle.  Try to make it 1 image.   9.  If you can, try to hold it for 10-30 sec increasing as able.    10.  Once you can make the image 1 for at least 30 sec in the middle, repeat #1-6 above with both eyes moving slowly and only in the range that you can keep the image 1.

## 2016-09-06 NOTE — Therapy (Signed)
[facility] [address] [place], [place], [address] Phone: [phone]   Fax:  [phone]  Occupational Therapy Treatment  Patient Details  Name: Amanda Horne MRN: [id number] Date of Birth: Nov 01, 1985 Referring Provider: Dr. Ramiro Harvest, Dr Epimenio Foot  Encounter Date: 09/06/2016      OT End of Session - 09/06/16 0930    Visit Number 2   Number of Visits 25   Date for OT Re-Evaluation 11/25/16   Authorization Type [employer]   Authorization Time Period 60 visit PT/OT combined   Authorization - Visit Number 2   OT Start Time [phone]   OT Stop Time 0931   OT Time Calculation (min) 38 min   Activity Tolerance Patient tolerated treatment well   Behavior During Therapy [facility] for tasks assessed/performed      Past Medical History:  Diagnosis Date  . Abnormal Pap smear   . Herpes   . Hx of varicella   . Hypertension   . Multiple sclerosis (HCC)   . Vision abnormalities     Past Surgical History:  Procedure Laterality Date  . COLPOSCOPY    . NO PAST SURGERIES    . WISDOM TOOTH EXTRACTION      Vitals:   09/06/16 0859  BP: (!) 148/90                    Therapistdjusted tape on pt's right glasses lens to prevent diplopia yet to allow peripheral vision. Pt was able to ambulate up hallway with minguard. She leans to left side.          OT Education - 09/06/16 0931    Education provided Yes   Education Details vision diplopia HEP, coordination activities with coins, cards   Person(s) Educated Patient;Other (comment)   Methods Explanation;Demonstration   Comprehension Verbalized understanding;Returned demonstration          OT Short Term Goals - 08/28/16 1303      OT SHORT TERM GOAL #1   Title I with HEP- due 10/11/16   Time 6   Period Weeks   Status New     OT SHORT TERM GOAL #2   Title Pt will verbalize understanding of compensatory strategies for visual impairments   Time 6   Period  Weeks   Status New     OT SHORT TERM GOAL #3   Title Test standing functional reach and set goal   Time 6   Period Weeks   Status New     OT SHORT TERM GOAL #4   Title Pt will perform all basic ADLS modified independently   Time 6   Period Weeks   Status New     OT SHORT TERM GOAL #5   Title Pt will perform light home management/ cooking  at a supervision level.   Time 6   Period Weeks   Status New     OT SHORT TERM GOAL #6   Title Pt will perform tabletop scanning and reading tasks without reports of diplopia.   Time 6   Period Weeks   Status New           OT Long Term Goals - 08/28/16 1305      OT LONG TERM GOAL #1   Title Pt will demonstrate adequate UE strength to retrieve a 3 lbs weight from ovehead shelf with each UE individually x 5 reps without dropping.-due 11/27/16   Time 12   Period Weeks   Status  New     OT LONG TERM GOAL #2   Title Pt will perform mod complex home management/ cooking at a modified indepdnet level without LOB.   Time 12   Period Weeks   Status New     OT LONG TERM GOAL #3   Title Pt will verbalize understanding of energy conservation/ activity modification for ADLs/IADLs.   Time 12   Period Weeks   Status New     OT LONG TERM GOAL #4   Title Pt will demonstrate improved LUE functional use as evidenced by improving  LUE 9 hole peg test score by 3 secs.   Time 12   Period Weeks   Status New     OT LONG TERM GOAL #5   Title Pt will demonstrate ability to navigate a busy environment and locate items with 95% or better accuracy and no reports of diplopia.   Time 12   Period Weeks   Status New               Plan - 09/06/16 1308    Clinical Impression Statement Pt is progressing towards goals. Pt demonstrates understanding of inital vision HEP.   Rehab Potential Good   OT Frequency 2x / week   OT Duration 12 weeks   OT Treatment/Interventions Self-care/ADL training;Patient/family education;Balance training;Therapeutic  exercises;Ultrasound;Therapeutic exercise;Therapeutic activities;Cognitive remediation/compensation;Passive range of motion;Neuromuscular education;Cryotherapy;Functional Mobility Training;Energy conservation;Manual Therapy;Visual/perceptual remediation/compensation   Plan check on vision HEP, address bilateral coordination   Consulted and Agree with Plan of Care Patient;Family member/caregiver      Patient will benefit from skilled therapeutic intervention in order to improve the following deficits and impairments:  Abnormal gait, Decreased knowledge of use of DME, Impaired vision/preception, Impaired sensation, Decreased mobility, Decreased coordination, Decreased activity tolerance, Decreased endurance, Decreased range of motion, Decreased strength, Decreased balance, Decreased knowledge of precautions, Decreased safety awareness, Difficulty walking, Impaired UE functional use  Visit Diagnosis: Muscle weakness (generalized)  Unsteadiness on feet  Other symptoms and signs involving the nervous system  Other lack of coordination  Visuospatial deficit  Other disturbances of skin sensation    Problem List Patient Active Problem List   Diagnosis Date Noted  . Gait disturbance 09/03/2016  . High risk medication use 09/03/2016  . Essential hypertension   . Numbness   . Multiple sclerosis (HCC) 08/20/2016  . Facial numbness 08/20/2016  . Vertigo 08/20/2016  . Headache 08/20/2016  . Double vision     RINE,KATHRYN 09/06/2016, 1:14 PM  [facility] [address] [place], [place], [address] Phone: [phone]   Fax:  [phone]  Name: Amanda Horne MRN: [id number] Date of Birth: 30-May-1985

## 2016-09-06 NOTE — Telephone Encounter (Signed)
Please see if she can come in Monday for an additional day of IV Solu-Medrol.

## 2016-09-07 NOTE — Patient Instructions (Signed)
From back packet: -Supine pelvic tilts 2 sets of 5 reps -bridging x 5 reps -Hooklying marching x 5 reps -Hooklying hip abduction x 5 reps Cues provided for abdominal activation  -Seated pelvic tilts x 5 reps  -Cues provided for upright posture and abdominal activation  To be performed once per day

## 2016-09-07 NOTE — Therapy (Signed)
[facility] [address] [place], [place], [address] Phone: [phone]   Fax:  [phone]  Physical Therapy Treatment  Patient Details  Name: Amanda Horne MRN: [id number] Date of Birth: 20-Aug-1985 Referring Provider: Ramiro Harvest  ((Hospital)-pt to follow up with Dr. Epimenio Foot)  Encounter Date: 09/06/2016      PT End of Session - 09/07/16 1910    Visit Number 2   Number of Visits 17   Date for PT Re-Evaluation 10/27/16   Authorization Type UHC-60 visit limit combined PT and OT   PT Start Time 0935   PT Stop Time 1018   PT Time Calculation (min) 43 min   Equipment Utilized During Treatment Gait belt   Activity Tolerance Patient limited by fatigue  Blood pressure beginning of session 140/97   Behavior During Therapy [facility] for tasks assessed/performed      Past Medical History:  Diagnosis Date  . Abnormal Pap smear   . Herpes   . Hx of varicella   . Hypertension   . Multiple sclerosis (HCC)   . Vision abnormalities     Past Surgical History:  Procedure Laterality Date  . COLPOSCOPY    . NO PAST SURGERIES    . WISDOM TOOTH EXTRACTION      There were no vitals filed for this visit.      Subjective Assessment - 09/07/16 1901    Subjective Pt reports having MS exacerbation, starting 09/02/16, with L hand going numb with increased balance issues-more lean to L.  Started on IV Solumedrol earlier this week.  Had to use the w/c due to such balance difficulty.     Patient is accompained by: Family member  Cousin   Patient Stated Goals Pt's goals for PT are to function better with walking, to get stronger, to not use walker.  (Has been using walker since hospitalization)   Currently in Pain? No/denies                         [facility] Adult PT Treatment/Exercise - 09/07/16 1902      Transfers   Transfers Sit to Stand;Stand to Sit   Sit to Stand 5: Supervision;With upper extremity assist;From  chair/3-in-1;4: Min guard   Stand to Sit 5: Supervision;With upper extremity assist;To chair/3-in-1;4: Min guard Multiple reps within session.  PT provides cues for technique and hand placement.     Ambulation/Gait   Ambulation/Gait Yes   Ambulation/Gait Assistance 5: Supervision   Ambulation/Gait Assistance Details Pt tends to veer the walker to the left during gait.  Cues provided for turning techniques, including wide walking turns keeping close inside walker's BOS, and stop and turn, picking up walker to turn and stepping into BOS of walker.   Ambulation Distance (Feet) 60 Feet  x 2, then 120 ft   Assistive device Rolling walker   Gait Pattern Step-through pattern;Decreased step length - right;Decreased stance time - right;Decreased dorsiflexion - right;Right genu recurvatum;Poor foot clearance - right;Left genu recurvatum   Ambulation Surface Level;Indoor   Gait Comments Pt requests use of wheelchair at end of session to get out to car due to feeling fatigued.     Standardized Balance Assessment   Standardized Balance Assessment Berg Balance Test     Berg Balance Test   Sit to Stand Needs minimal aid to stand or to stabilize   Standing Unsupported Unable to stand 30 seconds unassisted  16 sec-anterior lean   Sitting with  Back Unsupported but Feet Supported on Floor or Stool Able to sit safely and securely 2 minutes   Stand to Sit Controls descent by using hands   Transfers Needs one person to assist   Standing Unsupported with Eyes Closed Needs help to keep from falling   Standing Ubsupported with Feet Together Needs help to attain position and unable to hold for 15 seconds   From Standing, Reach Forward with Outstretched Arm Loses balance while trying/requires external support   From Standing Position, Pick up Object from Floor Unable to try/needs assist to keep balance   From Standing Position, Turn to Look Behind Over each Shoulder Needs assist to keep from losing balance and  falling   Turn 360 Degrees Needs assistance while turning   Standing Unsupported, Alternately Place Feet on Step/Stool Needs assistance to keep from falling or unable to try   Standing Unsupported, One Foot in [employer] balance while stepping or standing   Standing on One Leg Unable to try or needs assist to prevent fall   Total Score 9     Exercises   Exercises Lumbar;Knee/Hip     Lumbar Exercises: Seated   Other Seated Lumbar Exercises Seated anterior/posterior pelvic tilt x 5 reps, with cues for finding neutral posture and for abdominal activation.     Lumbar Exercises: Supine   Bridge Non-compliant;1 second;5 reps   Other Supine Lumbar Exercises Pelvic tilt-anterior and posterior (pt positioned on wedge per request to avoid dizziness)-x 10 reps   Other Supine Lumbar Exercises Hooklying hip abduction, single leg, 5 reps each.  Hooklying marching, alternating legs 5 reps each.  Cues provided for abdominal activation     Knee/Hip Exercises: Supine   Short Arc Quad Sets Strengthening;5 reps;Right;Left     Discussed pt's sleeping in recliner due to fear of vertigo feeling returning.  Recommended patient try HEP supine in bed propped with pillow and try to progress to sleeping in bed.             PT Education - 09/07/16 1907    Education provided Yes   Education Details HEP for low back/core stability provided-see instructions   Person(s) Educated Patient   Methods Explanation;Demonstration;Handout   Comprehension Verbalized understanding;Returned demonstration          PT Short Term Goals - 08/28/16 0953      PT SHORT TERM GOAL #1   Title Pt will be independent with HEP for improved balance, strength and gait.  TARGET 09/27/16   Time 4   Period Weeks   Status New     PT SHORT TERM GOAL #2   Title Pt will improve TUG score to less than or equal to 20 seconds for decreased fall risk.   Time 4   Period Weeks   Status New     PT SHORT TERM GOAL #3   Title Berg  Balance test to be assessed, with score to improve by at least 8 points for decreased fall risk.   Time 4   Period Weeks   Status New     PT SHORT TERM GOAL #4   Title Pt will perform sit<>stand transfers with minimal to no UE support, 8 of 10 trials, for improved transfer efficiency and functional strengthening.   Time 4   Period Weeks   Status New     PT SHORT TERM GOAL #5   Title Pt will verbalize understanding of local MS resources.   Time 4   Period Weeks  Status New           PT Long Term Goals - 08/28/16 0955      PT LONG TERM GOAL #1   Title Pt will verbalize understanding of fall prevention in home environment.  TARGET 10/27/16   Time 8   Period Weeks   Status New     PT LONG TERM GOAL #2   Title Pt will improve TUG score to less than or equal to 13.5 seconds for decreased fall risk.   Time 8   Period Weeks   Status New     PT LONG TERM GOAL #3   Title Pt will improve gait velocity to at least 1.8 ft/sec for decreased fall risk, improved efficiency and safety with gait.   Time 8   Period Weeks   Status New     PT LONG TERM GOAL #4   Title Pt will ambulate at least 500 ft, least restrictive assistive device, modified independently, for improved household and community gait.   Time 8   Period Weeks   Status New     PT LONG TERM GOAL #5   Title Pt will negotiate at least 4 steps, using one handrail, modified independently, for improved stair negotiation for home.   Time 8   Period Weeks   Status New               Plan - 09/07/16 1910    Clinical Impression Statement Treatment session focused on completing Berg Balance test (score of 9/56 due to unable to progress beyond static standing <30 seconds without UE support); initiating HEP and gait training with cues for improved safety and stability with gait.  Pt reports having exacerbation earlier this week and being put on 5-day treatment.  Pt fatigues by end of session and requests wheelchair to go  to car.  Pt will continue to benefit from further skilled PT to address strength, balance and gait training.  Pt did not have any additional vertigo complaints this visit.   Rehab Potential Good   Clinical Impairments Affecting Rehab Potential Severity of MS symptoms   PT Frequency 2x / week   PT Duration 8 weeks  plus evaluation   PT Treatment/Interventions ADLs/Self Care Home Management;Functional mobility training;Stair training;Gait training;DME Instruction;Therapeutic activities;Therapeutic exercise;Balance training;Neuromuscular re-education;Patient/family education;Orthotic Fit/Training   PT Next Visit Plan Review HEP provided 09/06/16 and progress as able for core stability and lower extremity strength.  Gait training and balance   Consulted and Agree with Plan of Care Patient;Family member/caregiver   Family Member Consulted Cousin      Patient will benefit from skilled therapeutic intervention in order to improve the following deficits and impairments:  Abnormal gait, Decreased activity tolerance, Decreased balance, Decreased mobility, Decreased knowledge of use of DME, Decreased endurance, Decreased strength, Difficulty walking, Postural dysfunction, Impaired vision/preception  Visit Diagnosis: Muscle weakness (generalized)  Other abnormalities of gait and mobility     Problem List Patient Active Problem List   Diagnosis Date Noted  . Gait disturbance 09/03/2016  . High risk medication use 09/03/2016  . Essential hypertension   . Numbness   . Multiple sclerosis (HCC) 08/20/2016  . Facial numbness 08/20/2016  . Vertigo 08/20/2016  . Headache 08/20/2016  . Double vision     MARRIOTT,AMY W. 09/07/2016, 7:16 PM  Gean Maidens., PT  [facility] [address] [place], [place], [address] Phone: [phone]   Fax:  [phone]  Name: LYLLIANA KITAMURA MRN:  [id number] Date of Birth: 1985/08/03

## 2016-09-09 ENCOUNTER — Encounter: Payer: 59 | Admitting: Occupational Therapy

## 2016-09-09 ENCOUNTER — Telehealth: Payer: Self-pay | Admitting: Neurology

## 2016-09-09 ENCOUNTER — Ambulatory Visit: Payer: 59 | Admitting: Physical Therapy

## 2016-09-09 NOTE — Telephone Encounter (Signed)
Duplicate/fim 

## 2016-09-09 NOTE — Telephone Encounter (Signed)
Pt would like to know if there are any other infusions that she needs to come into [facility] for.  Please call

## 2016-09-09 NOTE — Telephone Encounter (Signed)
After speaking with Mindy in the infusion suite, I have spoken with Idabell this morning and given appt. for IV SM 1gm, 1pm today/fim

## 2016-09-11 ENCOUNTER — Telehealth: Payer: Self-pay | Admitting: Neurology

## 2016-09-11 ENCOUNTER — Ambulatory Visit: Payer: 59 | Admitting: Occupational Therapy

## 2016-09-11 ENCOUNTER — Ambulatory Visit: Payer: 59 | Admitting: Physical Therapy

## 2016-09-11 DIAGNOSIS — M6281 Muscle weakness (generalized): Secondary | ICD-10-CM

## 2016-09-11 DIAGNOSIS — R41842 Visuospatial deficit: Secondary | ICD-10-CM

## 2016-09-11 DIAGNOSIS — R2681 Unsteadiness on feet: Secondary | ICD-10-CM

## 2016-09-11 DIAGNOSIS — R278 Other lack of coordination: Secondary | ICD-10-CM

## 2016-09-11 NOTE — Therapy (Signed)
[facility] [address] [place], [place], [address] Phone: [phone]   Fax:  [phone]  Occupational Therapy Treatment  Patient Details  Name: Amanda Horne MRN: [id number] Date of Birth: [birth year] Referring Provider: Dr. Irine Seal, Dr Felecia Shelling  Encounter Date: 09/11/2016      OT End of Session - 09/11/16 1739    Visit Number 3   Number of Visits 25   Date for OT Re-Evaluation 11/25/16   Authorization Type UHC   Authorization Time Period 57 visit PT/OT combined   Authorization - Visit Number 3   Authorization - Number of Visits 30   OT Start Time 8099   OT Stop Time 1145   OT Time Calculation (min) 40 min   Activity Tolerance Patient tolerated treatment well   Behavior During Therapy [facility] for tasks assessed/performed      Past Medical History:  Diagnosis Date  . Abnormal Pap smear   . Herpes   . Hx of varicella   . Hypertension   . Multiple sclerosis (Uncertain)   . Vision abnormalities     Past Surgical History:  Procedure Laterality Date  . COLPOSCOPY    . NO PAST SURGERIES    . WISDOM TOOTH EXTRACTION      There were no vitals filed for this visit.      Subjective Assessment - 09/11/16 1107    Subjective  No tape on glasses,    Pertinent History newly diagnosed MS, herpes, varicella   Patient Stated Goals improve balance   Currently in Pain? No/denies                     Treatment: environmental scanning 2/14 missed first pass, pt located items on second pass with increased time. Pt reports she has been performing vision HEP daily and double vision has improved. She has removed occlusion tape from glasses. Standing to copy small peg design with bilateral UE's, retrieving pegs from a lower surface, close supervision, no LOB. Fin motor coordination activities; flipping cards picking up and stacking coins and manipulating ball. Pt demonstrates significant progress and she met  her 9 hole peg test goal for LUE.         OT Short Term Goals - 09/11/16 1117      OT SHORT TERM GOAL #1   Title I with HEP- due 10/11/16   Time 6   Period Weeks   Status On-going     OT SHORT TERM GOAL #2   Title Pt will verbalize understanding of compensatory strategies for visual impairments   Time 6   Period Weeks   Status Achieved     OT SHORT TERM GOAL #3   Title Pt will demonstrate improved dynamic standing balance for ADLs as evidenced by increasing our standing functional reach to 13 inches, bilaterally   Time 6   Period Weeks   Status Achieved  RUE 11.5, LUE 11 inches     OT SHORT TERM GOAL #4   Title Pt will perform all basic ADLS modified independently   Time 6   Period Weeks   Status New     OT SHORT TERM GOAL #5   Title Pt will perform light home management/ cooking  at a supervision level.   Time 6   Period Weeks   Status New     OT SHORT TERM GOAL #6   Title Pt will perform tabletop scanning and reading tasks without reports of diplopia.  Time 6   Period Weeks   Status Achieved           OT Long Term Goals - 09/11/16 1126      OT LONG TERM GOAL #1   Title Pt will demonstrate adequate UE strength to retrieve a 3 lbs weight from ovehead shelf with each UE individually x 5 reps without dropping.-due 11/27/16   Time 12   Period Weeks   Status New     OT LONG TERM GOAL #2   Title Pt will perform mod complex home management/ cooking at a modified indepdnet level without LOB.   Time 12   Period Weeks   Status New     OT LONG TERM GOAL #3   Title Pt will verbalize understanding of energy conservation/ activity modification for ADLs/IADLs.   Time 12   Period Weeks   Status New     OT LONG TERM GOAL #4   Title Pt will demonstrate improved LUE functional use as evidenced by improving  LUE 9 hole peg test score by 3 secs.   Baseline 32.60   Time 12   Period Weeks   Status Achieved  26.78 secs     OT LONG TERM GOAL #5   Title Pt will  demonstrate ability to navigate a busy environment and locate items with 95% or better accuracy and no reports of diplopia.   Time 12   Period Weeks   Status On-going               Plan - 09/11/16 1115    Clinical Impression Statement Pt demonstrates progress towards goals. Pt has removed the occlusion tape from her glasses and she is no longer experiencing diplopia.   Rehab Potential Good   OT Frequency 2x / week   OT Duration 12 weeks   OT Treatment/Interventions Self-care/ADL training;Patient/family education;Balance training;Therapeutic exercises;Ultrasound;Therapeutic exercise;Therapeutic activities;Cognitive remediation/compensation;Passive range of motion;Neuromuscular education;Cryotherapy;Functional Mobility Training;Energy conservation;Manual Therapy;Visual/perceptual remediation/compensation   Plan continue to work towards goals      Patient will benefit from skilled therapeutic intervention in order to improve the following deficits and impairments:  Abnormal gait, Decreased knowledge of use of DME, Impaired vision/preception, Impaired sensation, Decreased mobility, Decreased coordination, Decreased activity tolerance, Decreased endurance, Decreased range of motion, Decreased strength, Decreased balance, Decreased knowledge of precautions, Decreased safety awareness, Difficulty walking, Impaired UE functional use  Visit Diagnosis: Muscle weakness (generalized)  Unsteadiness on feet  Other lack of coordination  Visuospatial deficit    Problem List Patient Active Problem List   Diagnosis Date Noted  . Gait disturbance 09/03/2016  . High risk medication use 09/03/2016  . Essential hypertension   . Numbness   . Multiple sclerosis ([place]) 08/20/2016  . Facial numbness 08/20/2016  . Vertigo 08/20/2016  . Headache 08/20/2016  . Double vision     RINE,KATHRYN 09/11/2016, 5:40 PM  [facility] [address]  [place], [place], [address] Phone: [phone]   Fax:  [phone]  Name: Amanda Horne MRN: [id number] Date of Birth: 1986/03/06

## 2016-09-11 NOTE — Patient Instructions (Addendum)
Straight Leg Raise    Tighten stomach and slowly raise straightend right leg __6__ inches from floor.  Hold 3 seconds then slowly lower Repeat _10___ times per set. Do _1-2___ sets per session. Do __1__ sessions per day.  http://orth.exer.us/1103   Copyright  VHI. All rights reserved.  HIP: Abduction / External Rotation (Band)  -DO THIS ON YOUR BACK!!!!    Place band around knees. Lie on BACK with hips and knees bent. Open one leg out to the side, keeping feet on the bed/floor. Hold _3__ seconds. Use ___red_____ band. Repeat 10 times.  Copyright  VHI. All rights reserved.  HIP: Extension / KNEE: Flexion, Tall Kneeling    Kneel with buttocks touching heels. Lift chest and bring hips forward.  Squeeze through hips and knees and hold 3 seconds __10_ reps per set, __1_ sets per day  Copyright  VHI. All rights reserved.

## 2016-09-11 NOTE — Telephone Encounter (Signed)
Patient called office in reference to lab results.  Please call °

## 2016-09-11 NOTE — Telephone Encounter (Signed)
LMOM for Amanda Horne that we are still waiting on JCV ab result.  Everything else is back and looks ok.  She doesn't need to return this call unless she has other questions/fim

## 2016-09-12 ENCOUNTER — Telehealth: Payer: Self-pay | Admitting: Neurology

## 2016-09-12 NOTE — Telephone Encounter (Signed)
Pt called said she is havinh stabbing pain rt side of the neck radiating down the arm. This started about 2 hrs ago, she has taken norco with no relief. Please call

## 2016-09-12 NOTE — Telephone Encounter (Signed)
I have spoken with Amanda Horne this afternoon. She c/o right sided neck and arm pain with movement. No relief with Hydrocodone.   Ok when still.  Sts. feeling a little anxious.  Pt. has Ativan 0.5, 3 tabs if needed for pending MRI.  She will take Ativan 0.5mg  for anxiety,  and otc antiinflammatory for pain since Hydrocodone is not working. (NKDA).  If condition worsens or changes this evening, she will go to the ER if needed.  I will check on her first thing in the morning.  Plan reviewed with Butch Penny, NP and she is agreeable.  Pt. is agreeable/fim

## 2016-09-12 NOTE — Therapy (Signed)
[facility] [address] [place], [place], [address] Phone: [phone]   Fax:  [phone]  Physical Therapy Treatment  Patient Details  Name: Amanda Horne MRN: [id number] Date of Birth: 14-Apr-1986 Referring Provider: Ramiro Harvest  ((Hospital)-pt to follow up with Dr. Epimenio Foot)  Encounter Date: 09/11/2016      PT End of Session - 09/12/16 0916    Visit Number 3   Number of Visits 17   Date for PT Re-Evaluation 10/27/16   Authorization Type UHC-60 visit limit combined PT and OT   PT Start Time 1022   PT Stop Time 1101   PT Time Calculation (min) 39 min   Activity Tolerance Patient tolerated treatment well   Behavior During Therapy [facility] for tasks assessed/performed      Past Medical History:  Diagnosis Date  . Abnormal Pap smear   . Herpes   . Hx of varicella   . Hypertension   . Multiple sclerosis (HCC)   . Vision abnormalities     Past Surgical History:  Procedure Laterality Date  . COLPOSCOPY    . NO PAST SURGERIES    . WISDOM TOOTH EXTRACTION      There were no vitals filed for this visit.      Subjective Assessment - 09/11/16 1025    Subjective Took the tape off my glasses, and I have not had any double vision.  No falls, overall less dizziness   Patient is accompained by: Family member  Mother   Patient Stated Goals Pt's goals for PT are to function better with walking, to get stronger, to not use walker.  (Has been using walker since hospitalization)   Currently in Pain? No/denies                         [facility] Adult PT Treatment/Exercise - 09/12/16 0001      Self-Care   Self-Care Other Self-Care Comments   Other Self-Care Comments  Pt voices concern regarding awaiting blood test results (too see which medication infusion would be best), also concern over whether insurance will pay for the appropriate MS medication.  Discussed that pt is noting improvement in vision and  lower extremity strength since last steroid dose.  PT discussed benefits of connecting with MS Society (local chapter or online) for support and information/MS resources.  Pt agrees to look online.     Exercises   Exercises Other Exercises   Other Exercises  Tall kneeling<>sit back on heels, x 10 reps with cues for glut/quad activation.  Core stability/strengthening in tall kneeling at mat, alternating UE lifts x 10 reps, then bilateral UE lifts x 5 reps with min guard due to increased forward lean.  Tall kneel>half knee position x 3 reps each side, min guard and cues for abdominal activation.     Lumbar Exercises: Supine   Bridge Compliant;5 reps  Review of HEP-pt return demo understanding   Other Supine Lumbar Exercises Pelvic tilt-anterior and posterior-able to perform in supine (no wedge) x 10 reps; return demo understanding as HEP review   Other Supine Lumbar Exercises Hooklying hip abduction, single leg, 5 reps each.  Hooklying marching, alternating legs 5 reps each.  Cues provided for abdominal activation  Review of HEP-pt return demo understanding     Knee/Hip Exercises: Aerobic   Other Aerobic Foot pedaler, x 3 minutes, 2 minutes forward and 1 minute back, moderate resistance     Knee/Hip Exercises: Supine  Short Arc [employer] Strengthening;Right;Left;1 set;10 reps   Straight Leg Raises Strengthening;Right;Left;10 reps   Other Supine Knee/Hip Exercises Supine hooklying hip abduction single leg, resisted with red theraband, x 5 reps each side-added to HEP     Knee/Hip Exercises: Sidelying   Hip ABduction Right;Other (comment)   Hip ABduction Limitations Attempted x 3 reps, pt needs assistance against gravity   Clams x 5 reps RLE     Pt reports no additional vertigo-type symptoms.  She is able to perform exercises on floor, but she has not yet returned to sleeping in bed-still sleeps in recliner.           PT Education - 09/12/16 0915    Education provided Yes    Education Details HEP additions-see instructions; use of foot pedaler at home 3-5 minutes, 3x/day; MS society information online   Person(s) Educated Patient   Methods Explanation;Demonstration;Handout   Comprehension Verbalized understanding;Returned demonstration          PT Short Term Goals - 08/28/16 0953      PT SHORT TERM GOAL #1   Title Pt will be independent with HEP for improved balance, strength and gait.  TARGET 09/27/16   Time 4   Period Weeks   Status New     PT SHORT TERM GOAL #2   Title Pt will improve TUG score to less than or equal to 20 seconds for decreased fall risk.   Time 4   Period Weeks   Status New     PT SHORT TERM GOAL #3   Title Berg Balance test to be assessed, with score to improve by at least 8 points for decreased fall risk.   Time 4   Period Weeks   Status New     PT SHORT TERM GOAL #4   Title Pt will perform sit<>stand transfers with minimal to no UE support, 8 of 10 trials, for improved transfer efficiency and functional strengthening.   Time 4   Period Weeks   Status New     PT SHORT TERM GOAL #5   Title Pt will verbalize understanding of local MS resources.   Time 4   Period Weeks   Status New           PT Long Term Goals - 08/28/16 0955      PT LONG TERM GOAL #1   Title Pt will verbalize understanding of fall prevention in home environment.  TARGET 10/27/16   Time 8   Period Weeks   Status New     PT LONG TERM GOAL #2   Title Pt will improve TUG score to less than or equal to 13.5 seconds for decreased fall risk.   Time 8   Period Weeks   Status New     PT LONG TERM GOAL #3   Title Pt will improve gait velocity to at least 1.8 ft/sec for decreased fall risk, improved efficiency and safety with gait.   Time 8   Period Weeks   Status New     PT LONG TERM GOAL #4   Title Pt will ambulate at least 500 ft, least restrictive assistive device, modified independently, for improved household and community gait.   Time 8    Period Weeks   Status New     PT LONG TERM GOAL #5   Title Pt will negotiate at least 4 steps, using one handrail, modified independently, for improved stair negotiation for home.   Time 8   Period Weeks  Status New               Plan - 09/12/16 5631    Clinical Impression Statement Focused majority of session today on review/updating of HEP and progression of core stability and lower extremity exercises.  Pt appears less fatigued today and able to perform lower extremity exercises with improved ease.  Pt has difficulty with hip abduction against gravity.  Pt will continue to benefit from skilled PT to address strength, balance and gait.   Rehab Potential Good   Clinical Impairments Affecting Rehab Potential Severity of MS symptoms   PT Frequency 2x / week   PT Duration 8 weeks  plus evaluation   PT Treatment/Interventions ADLs/Self Care Home Management;Functional mobility training;Stair training;Gait training;DME Instruction;Therapeutic activities;Therapeutic exercise;Balance training;Neuromuscular re-education;Patient/family education;Orthotic Fit/Training   PT Next Visit Plan Review HEP provided 09/10/16 and progress as able for core stability and lower extremity strength.  Gait training and balance   Consulted and Agree with Plan of Care Patient;Family member/caregiver   Family Member Consulted Mother      Patient will benefit from skilled therapeutic intervention in order to improve the following deficits and impairments:  Abnormal gait, Decreased activity tolerance, Decreased balance, Decreased mobility, Decreased knowledge of use of DME, Decreased endurance, Decreased strength, Difficulty walking, Postural dysfunction, Impaired vision/preception  Visit Diagnosis: Muscle weakness (generalized)  Unsteadiness on feet     Problem List Patient Active Problem List   Diagnosis Date Noted  . Gait disturbance 09/03/2016  . High risk medication use 09/03/2016  . Essential  hypertension   . Numbness   . Multiple sclerosis (HCC) 08/20/2016  . Facial numbness 08/20/2016  . Vertigo 08/20/2016  . Headache 08/20/2016  . Double vision     Roderica Cathell W. 09/12/2016, 9:20 AM  Gean Maidens., PT  [facility] [address] [place], [place], [address] Phone: [phone]   Fax:  [phone]  Name: Amanda Horne MRN: [id number] Date of Birth: 1985/10/17

## 2016-09-13 MED ORDER — BACLOFEN 10 MG PO TABS: ORAL_TABLET | ORAL | 0 refills | Status: DC

## 2016-09-13 NOTE — Telephone Encounter (Signed)
I have spoken with Amanda Horne again this morning.  She sts. she took otc NSAID and Ativan 0.25mg  yesterday evening, Neck and arm pain are resolved today.  Still has some right arm numbness.  After speaking with Dr. Lucia Gaskins, w/I doc this am, Baclofen 10mg  po up to 3 times daily offered.  Pt. is agreeable Rx. escribed to [facility] per her request/fim

## 2016-09-13 NOTE — Addendum Note (Signed)
Addended by: Candis Schatz I on: 09/13/2016 08:41 AM   Modules accepted: Orders

## 2016-09-15 ENCOUNTER — Ambulatory Visit
Admission: RE | Admit: 2016-09-15 | Discharge: 2016-09-15 | Disposition: A | Discharge status: Home / Self Care | Payer: 59 | Source: Ambulatory Visit | Attending: Neurology | Admitting: Neurology

## 2016-09-15 DIAGNOSIS — G35 Multiple sclerosis: Secondary | ICD-10-CM | POA: Diagnosis not present

## 2016-09-15 DIAGNOSIS — R269 Unspecified abnormalities of gait and mobility: Secondary | ICD-10-CM

## 2016-09-15 DIAGNOSIS — R200 Anesthesia of skin: Secondary | ICD-10-CM

## 2016-09-15 MED ORDER — GADOBENATE DIMEGLUMINE 529 MG/ML IV SOLN
15.0000 mL | Freq: Once | INTRAVENOUS | Status: AC | PRN
  Administered 2016-09-15: 15 mL via INTRAVENOUS

## 2016-09-16 ENCOUNTER — Encounter: Payer: Self-pay | Admitting: *Deleted

## 2016-09-16 ENCOUNTER — Telehealth: Payer: Self-pay | Admitting: *Deleted

## 2016-09-16 NOTE — Telephone Encounter (Signed)
-----   Message from Asa Lente, MD sent at 09/15/2016  4:38 PM EDT ----- Please let her know that the MRI of the cervical spine does show 3 MS lesions.   None of them looked brand-new

## 2016-09-16 NOTE — Telephone Encounter (Signed)
I have spoken with Amanda Horne this afternoon and per RAS, explained that MRI c/s shows 3 old lesions.  Also, JCV ab result came back this afternoon and it is negative.  She verbalized understanding of same, is agreeable with starting Tysabri.  Ty srf completed and placed in Tina's inbox in the infusion suite with a note that pt. is a new MS dx., not on any MS dmt right now, has more than 12 brain lesions and 3 cervical lesions, so needs to start Ty ASAP./fim

## 2016-09-17 ENCOUNTER — Ambulatory Visit: Payer: 59 | Admitting: Occupational Therapy

## 2016-09-17 ENCOUNTER — Ambulatory Visit: Payer: 59 | Attending: Internal Medicine | Admitting: Physical Therapy

## 2016-09-17 VITALS — BP 123/91

## 2016-09-17 DIAGNOSIS — R208 Other disturbances of skin sensation: Secondary | ICD-10-CM | POA: Insufficient documentation

## 2016-09-17 DIAGNOSIS — M6281 Muscle weakness (generalized): Secondary | ICD-10-CM

## 2016-09-17 DIAGNOSIS — R2681 Unsteadiness on feet: Secondary | ICD-10-CM | POA: Insufficient documentation

## 2016-09-17 DIAGNOSIS — R41842 Visuospatial deficit: Secondary | ICD-10-CM | POA: Diagnosis present

## 2016-09-17 DIAGNOSIS — R29818 Other symptoms and signs involving the nervous system: Secondary | ICD-10-CM | POA: Insufficient documentation

## 2016-09-17 DIAGNOSIS — R2689 Other abnormalities of gait and mobility: Secondary | ICD-10-CM | POA: Insufficient documentation

## 2016-09-17 DIAGNOSIS — R278 Other lack of coordination: Secondary | ICD-10-CM | POA: Insufficient documentation

## 2016-09-17 NOTE — Therapy (Signed)
[facility] [address] [place], [place], [address] Phone: [phone]   Fax:  [phone]  Occupational Therapy Treatment  Patient Details  Name: Amanda Horne MRN: [id number] Date of Birth: 1985-07-25 Referring Provider: Dr. Ramiro Harvest, Dr Epimenio Foot  Encounter Date: 09/17/2016      OT End of Session - 09/17/16 1314    Visit Number 4   Number of Visits 25   Date for OT Re-Evaluation 11/25/16   Authorization Type UHC   Authorization Time Period 60 visit PT/OT combined   Authorization - Visit Number 4   Authorization - Number of Visits 30   OT Start Time 1147   OT Stop Time 1230   OT Time Calculation (min) 43 min   Activity Tolerance Patient tolerated treatment well   Behavior During Therapy [facility] for tasks assessed/performed      Past Medical History:  Diagnosis Date  . Abnormal Pap smear   . Herpes   . Hx of varicella   . Hypertension   . Multiple sclerosis (HCC)   . Vision abnormalities     Past Surgical History:  Procedure Laterality Date  . COLPOSCOPY    . NO PAST SURGERIES    . WISDOM TOOTH EXTRACTION      Vitals:   09/17/16 1150  BP: (!) 123/91        Subjective Assessment - 09/17/16 1308    Subjective  Pt reports she is doing better but she fatigues quickly   Pertinent History newly diagnosed MS, herpes, varicella   Patient Stated Goals improve balance   Currently in Pain? No/denies          Treatment: Functional overhead reaching in seated to copy small peg design, with left and right UE's, min difficulty/ fatigue Environmental scanning in sequential order,  Min dificulty, pt missed 2 items on first pass, then she located on second pass.                     OT Education - 09/17/16 1313    Education provided Yes   Education Details energy conservation techniques   Person(s) Educated Patient   Methods Explanation;Demonstration;Verbal cues;Handout    Comprehension Verbalized understanding;Returned demonstration;Verbal cues required          OT Short Term Goals - 09/17/16 1152      OT SHORT TERM GOAL #1   Title I with HEP- due 10/11/16   Time 6   Period Weeks   Status On-going     OT SHORT TERM GOAL #2   Title Pt will verbalize understanding of compensatory strategies for visual impairments   Time 6   Period Weeks   Status Achieved     OT SHORT TERM GOAL #3   Title Pt will demonstrate improved dynamic standing balance for ADLs as evidenced by increasing our standing functional reach to 13 inches, bilaterally   Time 6   Period Weeks   Status On-going  RUE 11.5, LUE 11 inches     OT SHORT TERM GOAL #4   Title Pt will perform all basic ADLS modified independently   Time 6   Period Weeks   Status New     OT SHORT TERM GOAL #5   Title Pt will perform light home management/ cooking  at a supervision level.   Time 6   Period Weeks   Status New     OT SHORT TERM GOAL #6   Title Pt will perform tabletop  scanning and reading tasks without reports of diplopia.   Time 6   Period Weeks   Status Achieved           OT Long Term Goals - 09/11/16 1126      OT LONG TERM GOAL #1   Title Pt will demonstrate adequate UE strength to retrieve a 3 lbs weight from ovehead shelf with each UE individually x 5 reps without dropping.-due 11/27/16   Time 12   Period Weeks   Status New     OT LONG TERM GOAL #2   Title Pt will perform mod complex home management/ cooking at a modified indepdnet level without LOB.   Time 12   Period Weeks   Status New     OT LONG TERM GOAL #3   Title Pt will verbalize understanding of energy conservation/ activity modification for ADLs/IADLs.   Time 12   Period Weeks   Status New     OT LONG TERM GOAL #4   Title Pt will demonstrate improved LUE functional use as evidenced by improving  LUE 9 hole peg test score by 3 secs.   Baseline 32.60   Time 12   Period Weeks   Status Achieved  26.78  secs     OT LONG TERM GOAL #5   Title Pt will demonstrate ability to navigate a busy environment and locate items with 95% or better accuracy and no reports of diplopia.   Time 12   Period Weeks   Status On-going               Plan - 09/17/16 1312    Clinical Impression Statement Pt is progressing towards goals. She demonstrates improved visual perceptual skills   Rehab Potential Good   OT Frequency 2x / week   OT Duration 12 weeks   OT Treatment/Interventions Self-care/ADL training;Patient/family education;Balance training;Therapeutic exercises;Ultrasound;Therapeutic exercise;Therapeutic activities;Cognitive remediation/compensation;Passive range of motion;Neuromuscular education;Cryotherapy;Functional Mobility Training;Energy conservation;Manual Therapy;Visual/perceptual remediation/compensation   Plan work towards goals, dynamic standing balance   Consulted and Agree with Plan of Care Patient;Family member/caregiver      Patient will benefit from skilled therapeutic intervention in order to improve the following deficits and impairments:  Abnormal gait, Decreased knowledge of use of DME, Impaired vision/preception, Impaired sensation, Decreased mobility, Decreased coordination, Decreased activity tolerance, Decreased endurance, Decreased range of motion, Decreased strength, Decreased balance, Decreased knowledge of precautions, Decreased safety awareness, Difficulty walking, Impaired UE functional use  Visit Diagnosis: Muscle weakness (generalized)  Unsteadiness on feet  Other lack of coordination  Visuospatial deficit    Problem List Patient Active Problem List   Diagnosis Date Noted  . Gait disturbance 09/03/2016  . High risk medication use 09/03/2016  . Essential hypertension   . Numbness   . Multiple sclerosis (HCC) 08/20/2016  . Facial numbness 08/20/2016  . Vertigo 08/20/2016  . Headache 08/20/2016  . Double vision     RINE,KATHRYN 09/17/2016, 1:15  PM  [facility] [address] [place], [place], [address] Phone: [phone]   Fax:  [phone]  Name: Amanda Horne MRN: [id number] Date of Birth: [birth year]

## 2016-09-17 NOTE — Therapy (Signed)
[facility] [place] [facility] [address] [place], [place], [address] Phone: [phone]   Fax:  [phone]  Physical Therapy Treatment  Patient Details  Name: Amanda Horne MRN: [id number] Date of Birth: 01/15/1986 Referring Provider: Ramiro Harvest  ((Hospital)-pt to follow up with Dr. Epimenio Foot)  Encounter Date: 09/17/2016      PT End of Session - 09/17/16 1433    Visit Number 4   Number of Visits 17   Date for PT Re-Evaluation 10/27/16   Authorization Type UHC-60 visit limit combined PT and OT   Authorization - Visit Number 4   Authorization - Number of Visits 30   PT Start Time 1110  Pt arrives late   PT Stop Time 1144   PT Time Calculation (min) 34 min   Activity Tolerance Patient tolerated treatment well;Patient limited by fatigue  Pt fatigued at end of session   Behavior During Therapy [facility] for tasks assessed/performed      Past Medical History:  Diagnosis Date  . Abnormal Pap smear   . Herpes   . Hx of varicella   . Hypertension   . Multiple sclerosis (HCC)   . Vision abnormalities     Past Surgical History:  Procedure Laterality Date  . COLPOSCOPY    . NO PAST SURGERIES    . WISDOM TOOTH EXTRACTION      There were no vitals filed for this visit.      Subjective Assessment - 09/17/16 1111    Subjective Had the MRI on Sunday.  Have 3 lesions on spine.  Decided to go with Tysabri and I'm waiting insurance approval.  Been walking without walker pretty much since last PT visit.  No falls.  Been getting headaches, but BP has been running pretty normal at home.   Patient is accompained by: Family member  Mother   Patient Stated Goals Pt's goals for PT are to function better with walking, to get stronger, to not use walker.  (Has been using walker since hospitalization)   Currently in Pain? No/denies                         [facility] Adult PT Treatment/Exercise - 09/17/16 0001      Exercises   Exercises Other Exercises   Other Exercises  Tall kneeling<>sit back on heels, x 10 reps with cues for glut/quad activation.  Core stability/strengthening in tall kneeling at mat, alternating UE lifts x 10 reps, then bilateral UE lifts x 10 reps with supervision/min guard due to increased forward lean after 3rd rep.  Tall kneel with clasped UEs trunk rotation x 5 reps, then sidestepping in tall kneeling x 8 ft each direction.  Tall kneel>half kneel position x 5 reps each side, min guard and cues for abdominal activation.     Lumbar Exercises: Supine   Other Supine Lumbar Exercises Review of SLR hooklying clamshell (performed with green theraband today), with pt return demo understanding.  Instructed patient to continue hooklying hip abduction with red theraband at home, until 2 sets x 10 are easy, then progress to green theraband.     Knee/Hip Exercises: Stretches   Active Hamstring Stretch Right;Left;3 reps;Other (comment)  15 seconds, seated at edge of mat     Knee/Hip Exercises: Seated   Long Arc Quad Strengthening;Right;Left;10 reps   Knee/Hip Flexion Marching 5 reps, x 2, with lower extremities tremoring due to fatigue (at end of session).  Proceeded to gentle hamstring stretches to  end session.     Knee/Hip Exercises: Supine   Straight Leg Raises Strengthening;Right;Left;1 set;10 reps  Review of HEP-pt return demo understanding             Balance Exercises - 09/17/16 1128      Balance Exercises: Standing   Tandem Stance Eyes open;Upper extremity support 1;3 reps;10 secs   SLS Eyes open;Solid surface;Upper extremity support 1;3 reps;10 secs   Sidestepping 2 reps  Length of counter   Marching Limitations marching forward, length of counter, 1 rep with UE support.  Had to stop on 2nd rep due to fatigue.    In tandem and SLS activities, PT provides cues for glut, thigh, abdominal activation as well as use of visual cues for improved stability.  Asked about knee positioning  (bilateral in recurvatum with tandem stance-pt reports she has always had both knees to "pop" back like this)         PT Short Term Goals - 08/28/16 0953      PT SHORT TERM GOAL #1   Title Pt will be independent with HEP for improved balance, strength and gait.  TARGET 09/27/16   Time 4   Period Weeks   Status New     PT SHORT TERM GOAL #2   Title Pt will improve TUG score to less than or equal to 20 seconds for decreased fall risk.   Time 4   Period Weeks   Status New     PT SHORT TERM GOAL #3   Title Berg Balance test to be assessed, with score to improve by at least 8 points for decreased fall risk.   Time 4   Period Weeks   Status New     PT SHORT TERM GOAL #4   Title Pt will perform sit<>stand transfers with minimal to no UE support, 8 of 10 trials, for improved transfer efficiency and functional strengthening.   Time 4   Period Weeks   Status New     PT SHORT TERM GOAL #5   Title Pt will verbalize understanding of local MS resources.   Time 4   Period Weeks   Status New           PT Long Term Goals - 08/28/16 0955      PT LONG TERM GOAL #1   Title Pt will verbalize understanding of fall prevention in home environment.  TARGET 10/27/16   Time 8   Period Weeks   Status New     PT LONG TERM GOAL #2   Title Pt will improve TUG score to less than or equal to 13.5 seconds for decreased fall risk.   Time 8   Period Weeks   Status New     PT LONG TERM GOAL #3   Title Pt will improve gait velocity to at least 1.8 ft/sec for decreased fall risk, improved efficiency and safety with gait.   Time 8   Period Weeks   Status New     PT LONG TERM GOAL #4   Title Pt will ambulate at least 500 ft, least restrictive assistive device, modified independently, for improved household and community gait.   Time 8   Period Weeks   Status New     PT LONG TERM GOAL #5   Title Pt will negotiate at least 4 steps, using one handrail, modified independently, for improved  stair negotiation for home.   Time 8   Period Weeks   Status New  Plan - 09/17/16 1433    Clinical Impression Statement Pt presents to OP PT without walker today, but is noteably fatigued by end of session.  Cautioned patient regarding balance, gait, needing to rest frequently.  Reviewed strengthening exercises, which patient is progressing with core stability exercises with improved strength.  Worked on balance activities also at counter, but pt requested seated break due to fatigue.  Pt will continue to benefit from skilled physical therapy to address strength, balance and gait activities.   Rehab Potential Good   Clinical Impairments Affecting Rehab Potential Severity of MS symptoms   PT Frequency 2x / week   PT Duration 8 weeks  plus evaluation   PT Treatment/Interventions ADLs/Self Care Home Management;Functional mobility training;Stair training;Gait training;DME Instruction;Therapeutic activities;Therapeutic exercise;Balance training;Neuromuscular re-education;Patient/family education;Orthotic Fit/Training   PT Next Visit Plan Continue core stability exercises and lower extremity strength, gait training and balance   Consulted and Agree with Plan of Care Patient;Family member/caregiver   Family Member Consulted Mother      Patient will benefit from skilled therapeutic intervention in order to improve the following deficits and impairments:  Abnormal gait, Decreased activity tolerance, Decreased balance, Decreased mobility, Decreased knowledge of use of DME, Decreased endurance, Decreased strength, Difficulty walking, Postural dysfunction, Impaired vision/preception  Visit Diagnosis: Muscle weakness (generalized)  Unsteadiness on feet     Problem List Patient Active Problem List   Diagnosis Date Noted  . Gait disturbance 09/03/2016  . High risk medication use 09/03/2016  . Essential hypertension   . Numbness   . Multiple sclerosis (HCC) 08/20/2016  .  Facial numbness 08/20/2016  . Vertigo 08/20/2016  . Headache 08/20/2016  . Double vision     Kaila Devries W. 09/17/2016, 2:37 PM Gean Maidens., PT  [facility] [address] [place], [place], [address] Phone: [phone]   Fax:  [phone]  Name: Amanda Horne MRN: [id number] Date of Birth: 1985-12-05

## 2016-09-19 ENCOUNTER — Ambulatory Visit: Payer: 59 | Admitting: Occupational Therapy

## 2016-09-19 ENCOUNTER — Ambulatory Visit: Payer: 59 | Admitting: Physical Therapy

## 2016-09-25 ENCOUNTER — Ambulatory Visit: Payer: 59 | Admitting: Physical Therapy

## 2016-09-25 ENCOUNTER — Ambulatory Visit: Payer: 59 | Admitting: Occupational Therapy

## 2016-09-25 VITALS — BP 130/90

## 2016-09-25 VITALS — BP 132/92

## 2016-09-25 DIAGNOSIS — R278 Other lack of coordination: Secondary | ICD-10-CM

## 2016-09-25 DIAGNOSIS — R2681 Unsteadiness on feet: Secondary | ICD-10-CM

## 2016-09-25 DIAGNOSIS — M6281 Muscle weakness (generalized): Secondary | ICD-10-CM

## 2016-09-25 DIAGNOSIS — R41842 Visuospatial deficit: Secondary | ICD-10-CM

## 2016-09-25 NOTE — Therapy (Signed)
[facility] [address] [place], [place], [address] Phone: [phone]   Fax:  [phone]  Physical Therapy Treatment  Patient Details  Name: Amanda Horne MRN: [id number] Date of Birth: 02/25/1986 Referring Provider: Irine Seal  ((Hospital)-pt to follow up with Dr. Felecia Shelling)  Encounter Date: 09/25/2016      PT End of Session - 09/25/16 1129    Visit Number 5   Number of Visits 17   Date for PT Re-Evaluation 10/27/16   Authorization Type UHC-60 visit limit combined PT and OT   Authorization - Visit Number 5   Authorization - Number of Visits 30   PT Start Time 0848   PT Stop Time 0928   PT Time Calculation (min) 40 min   Activity Tolerance Patient tolerated treatment well   Behavior During Therapy [facility] for tasks assessed/performed      Past Medical History:  Diagnosis Date  . Abnormal Pap smear   . Herpes   . Hx of varicella   . Hypertension   . Multiple sclerosis ([place])   . Vision abnormalities     Past Surgical History:  Procedure Laterality Date  . COLPOSCOPY    . NO PAST SURGERIES    . WISDOM TOOTH EXTRACTION      Vitals:   09/25/16 0855  BP: (!) 132/92        Subjective Assessment - 09/25/16 0850    Subjective No changes since last visit.  Been having issues with blood pressure, but I'm going to my primary doctor tomorrow.  Went to [employer] then I got very tired all of a sudden.   Patient is accompained by: Family member  Mother   Patient Stated Goals Pt's goals for PT are to function better with walking, to get stronger, to not use walker.  (Has been using walker since hospitalization)   Currently in Pain? No/denies                         [facility] Adult PT Treatment/Exercise - 09/25/16 0855      Knee/Hip Exercises: Aerobic   Stepper SciFit seated stepper, x 4 minutes, Level 1.2, 4 extremities for lower extremity strengthening     Knee/Hip Exercises:  Supine   Hip Adduction Isometric Strengthening;Both;1 set;10 reps  Ball squeeze   Bridges Limitations Bridging x 10 reps with 3 seconds hold   Bridges with [employer] Strengthening;Both;1 set;5 reps   Bridges with Clamshell Strengthening;Both;1 set;5 reps   Other Supine Knee/Hip Exercises Bridge with attempt at hooklying marching (unweighting) x 4 reps   Other Supine Knee/Hip Exercises Supine with legs propped on red ball:  hip/knee flexion x 10 reps, then trunk rotation x 10 reps    Cues for abdominal activation for improved stability through mid-section with bridging activities. Sit<>stand, multiple reps throughout session, minimal UE use, for functional strengthening and transfer efficiency.        Balance Exercises - 09/25/16 0905      Balance Exercises: Standing   Tandem Stance Eyes open;Intermittent upper extremity support;3 reps;10 secs  Min guard when LLE in posterior position   Tandem Gait Forward;Upper extremity support;2 reps  length of counter   Retro Gait Upper extremity support;3 reps  forward/back length of counter   Sidestepping 2 reps  length of counter   Marching Limitations Marching forward, length of counter 2 reps with intermittent UE support   Other Standing Exercises Marching in place x 10,  side kicks alternating x 10, back kicks alternating x 10 reps   Overall Comments --  Pt needs 2 seated rest breaks due to fatigue           PT Education - 09/25/16 1128    Education provided Yes   Education Details Balance exercises added to HEP   Person(s) Educated Patient   Methods Explanation;Demonstration;Handout   Comprehension Verbalized understanding;Returned demonstration;Verbal cues required;Need further instruction          PT Short Term Goals - 09/25/16 1133      PT SHORT TERM GOAL #1   Title Pt will be independent with HEP for improved balance, strength and gait.  TARGET 09/27/16   Time 4   Period Weeks   Status On-going     PT SHORT TERM  GOAL #2   Title Pt will improve TUG score to less than or equal to 20 seconds for decreased fall risk.   Time 4   Period Weeks   Status On-going     PT SHORT TERM GOAL #3   Title Berg Balance test to be assessed, with score to improve by at least 8 points for decreased fall risk.   Time 4   Period Weeks   Status On-going     PT SHORT TERM GOAL #4   Title Pt will perform sit<>stand transfers with minimal to no UE support, 8 of 10 trials, for improved transfer efficiency and functional strengthening.   Time 4   Period Weeks   Status On-going     PT SHORT TERM GOAL #5   Title Pt will verbalize understanding of local MS resources.   Time 4   Period Weeks   Status On-going           PT Long Term Goals - 09/25/16 1133      PT LONG TERM GOAL #1   Title Pt will verbalize understanding of fall prevention in home environment.  TARGET 10/27/16   Time 8   Period Weeks   Status On-going     PT LONG TERM GOAL #2   Title Pt will improve TUG score to less than or equal to 13.5 seconds for decreased fall risk.   Time 8   Period Weeks   Status On-going     PT LONG TERM GOAL #3   Title Pt will improve gait velocity to at least 1.8 ft/sec for decreased fall risk, improved efficiency and safety with gait.   Time 8   Period Weeks   Status On-going     PT LONG TERM GOAL #4   Title Pt will ambulate at least 500 ft, least restrictive assistive device, modified independently, for improved household and community gait.   Time 8   Period Weeks   Status On-going     PT LONG TERM GOAL #5   Title Pt will negotiate at least 4 steps, using one handrail, modified independently, for improved stair negotiation for home.   Time 8   Period Weeks   Status On-going               Plan - 09/25/16 1130    Clinical Impression Statement Addressed core stability and lower extremity strengthening as well as balance activities this session.  Pt needs cues to relax arms with balance activities,  as she tends to hold UEs in mid-high guard position.  Pt has been cancelling second visit most weeks, so frequency has not been met-STGs were to be assessed next visit this  week, but pt cancelled that visit.  Pt continues to progress with mobility and balance, but continues to experience fatigue throughout session.   Rehab Potential Good   Clinical Impairments Affecting Rehab Potential Severity of MS symptoms   PT Frequency 2x / week   PT Duration 8 weeks   PT Treatment/Interventions ADLs/Self Care Home Management;Functional mobility training;Stair training;Gait training;DME Instruction;Therapeutic activities;Therapeutic exercise;Balance training;Neuromuscular re-education;Patient/family education;Orthotic Fit/Training   PT Next Visit Plan Check STGs, continue to progress core stability, lower extremity and balance exercises; work on gait training activities    Consulted and Agree with Plan of Care Patient      Patient will benefit from skilled therapeutic intervention in order to improve the following deficits and impairments:  Abnormal gait, Decreased activity tolerance, Decreased balance, Decreased mobility, Decreased knowledge of use of DME, Decreased endurance, Decreased strength, Difficulty walking, Postural dysfunction, Impaired vision/preception  Visit Diagnosis: Muscle weakness (generalized)  Unsteadiness on feet     Problem List Patient Active Problem List   Diagnosis Date Noted  . Gait disturbance 09/03/2016  . High risk medication use 09/03/2016  . Essential hypertension   . Numbness   . Multiple sclerosis ([place]) 08/20/2016  . Facial numbness 08/20/2016  . Vertigo 08/20/2016  . Headache 08/20/2016  . Double vision     Laketa Sandoz W. 09/25/2016, 11:34 AM Frazier Butt., PT  [facility] [address] [place], [place], [address] Phone: [phone]   Fax:  [phone]  Name: TAJUANNA BURNETT MRN:  [id number] Date of Birth: 1985/07/14

## 2016-09-25 NOTE — Patient Instructions (Addendum)
Side-Stepping    AT the counter, walk to left side with eyes open. Take even steps, leading with same foot. Make sure each foot lifts off the floor. Repeat in opposite direction. Repeat for _3___ reps each direction. Do __1-2__ sessions per day.   Copyright  VHI. All rights reserved.  Tandem Walking    AT the counter, walk with each foot directly in front of other, heel of one foot touching toes of other foot with each step. Both feet straight ahead.  Repeat 2-3 lengths of the counter, holding on as needed.  Try to look straight ahead at target and make sure to stand tall.   Copyright  VHI. All rights reserved.  "I love a Licensed conveyancer    Using a chair/counter if necessary, march in place, alternating your legs. Repeat __10__ times. Do __1-2__ sessions per day.  http://gt2.exer.us/345   Copyright  VHI. All rights reserved.

## 2016-09-25 NOTE — Patient Instructions (Signed)
  Strengthening: Resisted Flexion   Hold tubing with _each____ arm(s) at side. Pull forward and up. Move shoulder through pain-free range of motion. Repeat __10__ times per set.  Do _1-2_ sessions per day , every other day   Strengthening: Resisted Extension   Hold tubing in __each___ hand(s), arm forward. Pull arm back, elbow straight. Repeat _10___ times per set. Do _1-2___ sessions per day, every other day.   Resisted Horizontal Abduction: Bilateral   Sit or stand, tubing in both hands, arms out in front. Keeping arms straight, pinch shoulder blades together and stretch arms out. Repeat _10___ times per set. Do _1-2___ sessions per day, every other day.   Elbow Flexion: Resisted   With tubing held in _each_____ hand(s) and other end secured under foot, curl arm up as far as possible. Repeat _10___ times per set. Do _1-2___ sessions per day, every other day.    Elbow Extension: Resisted   Sit in chair with resistive band secured at armrest (or hold with other hand) and ____each___ elbow bent. Straighten elbow. Repeat _10___ times per set.  Do _1-2___ sessions per day, every other day.   Copyright  VHI. All rights reserved.   

## 2016-09-25 NOTE — Therapy (Signed)
[facility] [address] [place], [place], [address] Phone: [phone]   Fax:  [phone]  Occupational Therapy Treatment  Patient Details  Name: Amanda Horne MRN: [id number] Date of Birth: 1985/06/13 Referring Provider: Dr. Ramiro Harvest, Dr Epimenio Foot  Encounter Date: 09/25/2016      OT End of Session - 09/25/16 1309    Visit Number 5   Number of Visits 25   Date for OT Re-Evaluation 11/25/16   Authorization Type UHC   Authorization Time Period 60 visit PT/OT combined   Authorization - Visit Number 5   Authorization - Number of Visits 30   OT Start Time 0936  2 units, pt in BR   OT Stop Time 1015   OT Time Calculation (min) 39 min   Activity Tolerance Patient tolerated treatment well   Behavior During Therapy [facility] for tasks assessed/performed      Past Medical History:  Diagnosis Date  . Abnormal Pap smear   . Herpes   . Hx of varicella   . Hypertension   . Multiple sclerosis ([facility])   . Vision abnormalities     Past Surgical History:  Procedure Laterality Date  . COLPOSCOPY    . NO PAST SURGERIES    . WISDOM TOOTH EXTRACTION      Vitals:   09/25/16 0940  BP: 130/90                      Dynamic reaching while standing on non-compliant surface to place / remove graded clothespins with left and right UE's with trunk rotation, min difficulty close supervision.         OT Education - 09/25/16 1307    Education provided Yes   Education Details gresn theraband HEP- 10 reps each bilateral UE's   Person(s) Educated Patient   Methods Explanation;Demonstration;Verbal cues;Handout   Comprehension Verbalized understanding;Returned demonstration          OT Short Term Goals - 09/17/16 1152      OT SHORT TERM GOAL #1   Title I with HEP- due 10/11/16   Time 6   Period Weeks   Status On-going     OT SHORT TERM GOAL #2   Title Pt will verbalize understanding of compensatory  strategies for visual impairments   Time 6   Period Weeks   Status Achieved     OT SHORT TERM GOAL #3   Title Pt will demonstrate improved dynamic standing balance for ADLs as evidenced by increasing our standing functional reach to 13 inches, bilaterally   Time 6   Period Weeks   Status On-going  RUE 11.5, LUE 11 inches     OT SHORT TERM GOAL #4   Title Pt will perform all basic ADLS modified independently   Time 6   Period Weeks   Status New     OT SHORT TERM GOAL #5   Title Pt will perform light home management/ cooking  at a supervision level.   Time 6   Period Weeks   Status New     OT SHORT TERM GOAL #6   Title Pt will perform tabletop scanning and reading tasks without reports of diplopia.   Time 6   Period Weeks   Status Achieved           OT Long Term Goals - 09/11/16 1126      OT LONG TERM GOAL #1   Title Pt will demonstrate adequate UE strength  to retrieve a 3 lbs weight from [place] shelf with each UE individually x 5 reps without dropping.-due 11/27/16   Time 12   Period Weeks   Status New     OT LONG TERM GOAL #2   Title Pt will perform mod complex home management/ cooking at a modified indepdnet level without LOB.   Time 12   Period Weeks   Status New     OT LONG TERM GOAL #3   Title Pt will verbalize understanding of energy conservation/ activity modification for ADLs/IADLs.   Time 12   Period Weeks   Status New     OT LONG TERM GOAL #4   Title Pt will demonstrate improved LUE functional use as evidenced by improving  LUE 9 hole peg test score by 3 secs.   Baseline 32.60   Time 12   Period Weeks   Status Achieved  26.78 secs     OT LONG TERM GOAL #5   Title Pt will demonstrate ability to navigate a busy environment and locate items with 95% or better accuracy and no reports of diplopia.   Time 12   Period Weeks   Status On-going               Plan - 09/25/16 1308    Clinical Impression Statement Pt is progressing towards  goals. she demonstrates understanding of UE HEP.   Rehab Potential Good   OT Frequency 2x / week   OT Duration 12 weeks   OT Treatment/Interventions Self-care/ADL training;Patient/family education;Balance training;Therapeutic exercises;Ultrasound;Therapeutic exercise;Therapeutic activities;Cognitive remediation/compensation;Passive range of motion;Neuromuscular education;Cryotherapy;Functional Mobility Training;Energy conservation;Manual Therapy;Visual/perceptual remediation/compensation   Plan work towards unmet goals.   Consulted and Agree with Plan of Care Patient      Patient will benefit from skilled therapeutic intervention in order to improve the following deficits and impairments:  Abnormal gait, Decreased knowledge of use of DME, Impaired vision/preception, Impaired sensation, Decreased mobility, Decreased coordination, Decreased activity tolerance, Decreased endurance, Decreased range of motion, Decreased strength, Decreased balance, Decreased knowledge of precautions, Decreased safety awareness, Difficulty walking, Impaired UE functional use  Visit Diagnosis: Muscle weakness (generalized)  Other lack of coordination  Unsteadiness on feet  Visuospatial deficit    Problem List Patient Active Problem List   Diagnosis Date Noted  . Gait disturbance 09/03/2016  . High risk medication use 09/03/2016  . Essential hypertension   . Numbness   . Multiple sclerosis ([facility]) 08/20/2016  . Facial numbness 08/20/2016  . Vertigo 08/20/2016  . Headache 08/20/2016  . Double vision     RINE,KATHRYN 09/25/2016, 1:10 PM  [facility] [address] [place], [place], [address] Phone: [phone]   Fax:  [phone]  Name: Amanda Horne MRN: [id number] Date of Birth: 14-Mar-1986

## 2016-09-26 ENCOUNTER — Ambulatory Visit: Payer: 59 | Admitting: Physical Therapy

## 2016-09-26 ENCOUNTER — Ambulatory Visit: Payer: 59 | Admitting: Occupational Therapy

## 2016-09-27 ENCOUNTER — Encounter: Payer: Self-pay | Admitting: *Deleted

## 2016-10-01 ENCOUNTER — Ambulatory Visit: Payer: 59 | Admitting: Physical Therapy

## 2016-10-01 ENCOUNTER — Ambulatory Visit: Payer: 59 | Admitting: Occupational Therapy

## 2016-10-01 VITALS — BP 130/75

## 2016-10-01 DIAGNOSIS — M6281 Muscle weakness (generalized): Secondary | ICD-10-CM | POA: Diagnosis not present

## 2016-10-01 DIAGNOSIS — R41842 Visuospatial deficit: Secondary | ICD-10-CM

## 2016-10-01 DIAGNOSIS — R29818 Other symptoms and signs involving the nervous system: Secondary | ICD-10-CM

## 2016-10-01 DIAGNOSIS — R2681 Unsteadiness on feet: Secondary | ICD-10-CM

## 2016-10-01 DIAGNOSIS — R278 Other lack of coordination: Secondary | ICD-10-CM

## 2016-10-01 DIAGNOSIS — R208 Other disturbances of skin sensation: Secondary | ICD-10-CM

## 2016-10-01 NOTE — Therapy (Signed)
[facility] [address] [place], [place], [address] Phone: [phone]   Fax:  [phone]  Occupational Therapy Treatment  Patient Details  Name: Amanda Horne MRN: [id number] Date of Birth: 05/13/1986 Referring Provider: Dr. Ramiro Harvest, Dr Epimenio Foot  Encounter Date: 10/01/2016      OT End of Session - 10/01/16 1400    Visit Number 6   Number of Visits 25   Date for OT Re-Evaluation 11/25/16   Authorization Type UHC   Authorization Time Period 60 visit PT/OT combined   Authorization - Visit Number 6   Authorization - Number of Visits 30   OT Start Time 1320   OT Stop Time 1400   OT Time Calculation (min) 40 min   Activity Tolerance Patient tolerated treatment well   Behavior During Therapy [facility] for tasks assessed/performed      Past Medical History:  Diagnosis Date  . Abnormal Pap smear   . Herpes   . Hx of varicella   . Hypertension   . Multiple sclerosis ([facility])   . Vision abnormalities     Past Surgical History:  Procedure Laterality Date  . COLPOSCOPY    . NO PAST SURGERIES    . WISDOM TOOTH EXTRACTION      Vitals:   10/01/16 1639  BP: 130/75        Subjective Assessment - 10/01/16 1638    Subjective  Pt reports that her work wants her to return next week, therapsit expressed concerns over pt overfatiguing herself   Pertinent History newly diagnosed MS, herpes, varicella   Patient Stated Goals improve balance   Currently in Pain? No/denies           Treatment: Therapist discussed return to work with pt and the risk of pt over fatiguing herself. Therapist encouraged pt to consider taking several more weeks off from work and then therapist recommended pt returns at 1/2 time. Pt reports she is going to talk with Dr. Epimenio Foot, and see if he will write her a note to wait until after she sees him in June to return to therapy. Typing test: Net 24 wpm, 88% accuracy. Typing activity for speed  and visual perceptual skills( clouds) with good accuracy. Simulated work task with therapist dictating info to pt while she typed. Tabletop scanning to identify the number that repeats 4x in a line, 21M print, min difficulty to get started.                     OT Short Term Goals - 10/01/16 1346      OT SHORT TERM GOAL #1   Title I with HEP- due 10/11/16   Time 6   Period Weeks   Status On-going     OT SHORT TERM GOAL #2   Title Pt will verbalize understanding of compensatory strategies for visual impairments   Time 6   Period Weeks   Status Achieved     OT SHORT TERM GOAL #3   Title Pt will demonstrate improved dynamic standing balance for ADLs as evidenced by increasing our standing functional reach to 13 inches, bilaterally   Time 6   Period Weeks   Status On-going  RUE 11.5, LUE 11 inches     OT SHORT TERM GOAL #4   Title Pt will perform all basic ADLS modified independently   Time 6   Period Weeks   Status Achieved     OT SHORT TERM GOAL #5  Title Pt will perform light home management/ cooking  at a supervision level.   Time 6   Period Weeks   Status On-going  Pt has performed some light home management     OT SHORT TERM GOAL #6   Title Pt will perform tabletop scanning and reading tasks without reports of diplopia.   Time 6   Period Weeks   Status Achieved           OT Long Term Goals - 10/01/16 1329      OT LONG TERM GOAL #1   Title Pt will demonstrate adequate UE strength to retrieve a 3 lbs weight from ovehead shelf with each UE individually x 5 reps without dropping.-due 11/27/16   Time 12   Period Weeks   Status New     OT LONG TERM GOAL #2   Title Pt will perform mod complex home management/ cooking at a modified independent level without LOB.   Time 12   Period Weeks   Status New     OT LONG TERM GOAL #3   Title Pt will verbalize understanding of energy conservation/ activity modification for ADLs/IADLs.   Time 12   Period  Weeks   Status New     OT LONG TERM GOAL #4   Title Pt will demonstrate improved LUE functional use as evidenced by improving  LUE 9 hole peg test score by 3 secs.   Baseline 32.60   Time 12   Period Weeks   Status Achieved  26.78 secs     OT LONG TERM GOAL #5   Title Pt will demonstrate ability to navigate a busy environment and locate items with 95% or better accuracy and no reports of diplopia.   Time 12   Period Weeks   Status On-going               Plan - 10/01/16 1348    Clinical Impression Statement Pt is progressing towards goals. She demonstrates improved fine motor coordination for typing.   Rehab Potential Good   OT Frequency 2x / week   OT Duration 12 weeks   OT Treatment/Interventions Self-care/ADL training;Patient/family education;Balance training;Therapeutic exercises;Ultrasound;Therapeutic exercise;Therapeutic activities;Cognitive remediation/compensation;Passive range of motion;Neuromuscular education;Cryotherapy;Functional Mobility Training;Energy conservation;Manual Therapy;Visual/perceptual remediation/compensation   Plan Continue to work towards unmet goals, environmental scanning with cognitive component   Consulted and Agree with Plan of Care Patient      Patient will benefit from skilled therapeutic intervention in order to improve the following deficits and impairments:  Abnormal gait, Decreased knowledge of use of DME, Impaired vision/preception, Impaired sensation, Decreased mobility, Decreased coordination, Decreased activity tolerance, Decreased endurance, Decreased range of motion, Decreased strength, Decreased balance, Decreased knowledge of precautions, Decreased safety awareness, Difficulty walking, Impaired UE functional use  Visit Diagnosis: Muscle weakness (generalized)  Other lack of coordination  Visuospatial deficit  Other symptoms and signs involving the nervous system  Other disturbances of skin sensation    Problem  List Patient Active Problem List   Diagnosis Date Noted  . Gait disturbance 09/03/2016  . High risk medication use 09/03/2016  . Essential hypertension   . Numbness   . Multiple sclerosis ([facility]) 08/20/2016  . Facial numbness 08/20/2016  . Vertigo 08/20/2016  . Headache 08/20/2016  . Double vision     Baltasar Twilley 10/01/2016, 4:41 PM  [facility] [address] [place], [place], [address] Phone: [phone]   Fax:  [phone]  Name: Amanda Horne MRN: [id number] Date of  Birth: 1985/07/13

## 2016-10-02 ENCOUNTER — Telehealth: Payer: Self-pay | Admitting: Neurology

## 2016-10-02 NOTE — Telephone Encounter (Signed)
Pt wanting to know if disability could read for return to work after 2nd infusion on 6/8. She said working on the computer for 10 min yesterday was really hard on her eyes in PT. Please call

## 2016-10-02 NOTE — Therapy (Signed)
[facility] [address] [place], [place], [address] Phone: [phone]   Fax:  [phone]  Physical Therapy Treatment  Patient Details  Name: Amanda Horne MRN: [id number] Date of Birth: Jun 19, 1985 Referring Provider: Irine Seal  ((Hospital)-pt to follow up with Dr. Felecia Shelling)  Encounter Date: 10/01/2016      PT End of Session - 10/02/16 1848    Visit Number 6   Number of Visits 17   Date for PT Re-Evaluation 10/27/16   Authorization Type UHC-60 visit limit combined PT and OT   Authorization - Visit Number 6   Authorization - Number of Visits 30   PT Start Time 1450   PT Stop Time 1533   PT Time Calculation (min) 43 min      Past Medical History:  Diagnosis Date  . Abnormal Pap smear   . Herpes   . Hx of varicella   . Hypertension   . Multiple sclerosis ([place])   . Vision abnormalities     Past Surgical History:  Procedure Laterality Date  . COLPOSCOPY    . NO PAST SURGERIES    . WISDOM TOOTH EXTRACTION      There were no vitals filed for this visit.      Subjective Assessment - 10/02/16 1843    Subjective Pt states she is doing much better; states she may have to go back to work in next 1-2 weeks   Patient is accompained by: Family member   Patient Stated Goals Pt's goals for PT are to function better with walking, to get stronger, to not use walker.  (Has been using walker since hospitalization)   Currently in Pain? No/denies                         [facility] Adult PT Treatment/Exercise - 10/01/16 1508      Ambulation/Gait   Gait velocity 7.78     Berg Balance Test   Sit to Stand Able to stand without using hands and stabilize independently   Standing Unsupported Able to stand safely 2 minutes   Sitting with Back Unsupported but Feet Supported on Floor or Stool Able to sit safely and securely 2 minutes   Stand to Sit Sits safely with minimal use of hands   Transfers Able to  transfer safely, minor use of hands   Standing Unsupported with Eyes Closed Able to stand 10 seconds safely   Standing Ubsupported with Feet Together Able to place feet together independently and stand 1 minute safely   From Standing, Reach Forward with Outstretched Arm Can reach confidently >25 cm (10")   From Standing Position, Pick up Object from Floor Able to pick up shoe safely and easily   From Standing Position, Turn to Look Behind Over each Shoulder Looks behind from both sides and weight shifts well   Turn 360 Degrees Able to turn 360 degrees safely in 4 seconds or less   Standing Unsupported, Alternately Place Feet on Step/Stool Able to stand independently and safely and complete 8 steps in 20 seconds  9.93   Standing Unsupported, One Foot in Front Able to place foot tandem independently and hold 30 seconds   Standing on One Leg Able to lift leg independently and hold equal to or more than 3 seconds  2.59, 3.15   Total Score 54      TUG score 9.12 secs with no device   sit to stand 10 reps with  no UE support from mat surface  Self Care; pt was given info on MS magazine [employer]) and also given card from magazine [employer] Now  Pt was given info on Aquatics ex. Class for people with MS at [facility] - 10/01/16 1457      PT SHORT TERM GOAL #1   Title Pt will be independent with HEP for improved balance, strength and gait.  TARGET 09/27/16   Status Achieved     PT SHORT TERM GOAL #2   Title Pt will improve TUG score to less than or equal to 20 seconds for decreased fall risk.   Baseline 9.12 secs with no device   Status Achieved     PT SHORT TERM GOAL #3   Title Berg Balance test to be assessed, with score to improve by at least 8 points for decreased fall risk.   Status Achieved     PT SHORT TERM GOAL #4   Title Pt will perform sit<>stand transfers with minimal to no UE support, 8 of 10 trials, for improved transfer efficiency and  functional strengthening.   Baseline 10/10 times with no UE support    Status Achieved     PT SHORT TERM GOAL #5   Title Pt will verbalize understanding of local MS resources.   Status Achieved           PT Long Term Goals - 10/01/16 1507      PT LONG TERM GOAL #2   Title Pt will improve TUG score to less than or equal to 13.5 seconds for decreased fall risk.   Baseline 9.12 secs without device               Plan - 10/02/16 1849    Clinical Impression Statement Pt has met all STG's - balance and gait are WFL's:  DVA WNL's with a 2 line difference (LIne 10 static and line 8 dynamic)   Rehab Potential Good   PT Frequency 2x / week   PT Duration 8 weeks   PT Treatment/Interventions ADLs/Self Care Home Management;Functional mobility training;Stair training;Gait training;DME Instruction;Therapeutic activities;Therapeutic exercise;Balance training;Neuromuscular re-education;Patient/family education;Orthotic Fit/Training   PT Next Visit Plan cont strengthening for core stabilization   Consulted and Agree with Plan of Care Patient      Patient will benefit from skilled therapeutic intervention in order to improve the following deficits and impairments:  Abnormal gait, Decreased activity tolerance, Decreased balance, Decreased mobility, Decreased knowledge of use of DME, Decreased endurance, Decreased strength, Difficulty walking, Postural dysfunction, Impaired vision/preception  Visit Diagnosis: Other symptoms and signs involving the nervous system  Unsteadiness on feet     Problem List Patient Active Problem List   Diagnosis Date Noted  . Gait disturbance 09/03/2016  . High risk medication use 09/03/2016  . Essential hypertension   . Numbness   . Multiple sclerosis ([place]) 08/20/2016  . Facial numbness 08/20/2016  . Vertigo 08/20/2016  . Headache 08/20/2016  . Double vision     Amanda Horne, Amanda Horne, PT 10/02/2016, 6:55 PM  [facility] [address] [place], [place], [address] Phone: [phone]   Fax:  [phone]  Name: Amanda Horne MRN: [id number] Date of Birth: 03/16/1986

## 2016-10-03 ENCOUNTER — Ambulatory Visit: Payer: 59 | Admitting: Occupational Therapy

## 2016-10-03 ENCOUNTER — Ambulatory Visit: Payer: 59 | Admitting: Physical Therapy

## 2016-10-03 DIAGNOSIS — R278 Other lack of coordination: Secondary | ICD-10-CM

## 2016-10-03 DIAGNOSIS — M6281 Muscle weakness (generalized): Secondary | ICD-10-CM | POA: Diagnosis not present

## 2016-10-03 DIAGNOSIS — R29818 Other symptoms and signs involving the nervous system: Secondary | ICD-10-CM

## 2016-10-03 DIAGNOSIS — R2689 Other abnormalities of gait and mobility: Secondary | ICD-10-CM

## 2016-10-03 DIAGNOSIS — R2681 Unsteadiness on feet: Secondary | ICD-10-CM

## 2016-10-03 DIAGNOSIS — R41842 Visuospatial deficit: Secondary | ICD-10-CM

## 2016-10-03 DIAGNOSIS — R208 Other disturbances of skin sensation: Secondary | ICD-10-CM

## 2016-10-03 NOTE — Therapy (Signed)
[facility] [address] [place], [place], [address] Phone: [phone]   Fax:  [phone]  Occupational Therapy Treatment  Patient Details  Name: Amanda Horne MRN: [id number] Date of Birth: [birth year] Referring Provider: Dr. Irine Seal, Dr Felecia Shelling  Encounter Date: 10/03/2016      OT End of Session - 10/03/16 1153    Visit Number 7   Number of Visits 25   Date for OT Re-Evaluation 11/25/16   Authorization Type UHC   Authorization Time Period 60 visit PT/OT combined   Authorization - Visit Number 7   Authorization - Number of Visits 30   OT Start Time [phone]  pt late   OT Stop Time 0930   OT Time Calculation (min) 35 min   Activity Tolerance Patient tolerated treatment well   Behavior During Therapy [facility] for tasks assessed/performed      Past Medical History:  Diagnosis Date  . Abnormal Pap smear   . Herpes   . Hx of varicella   . Hypertension   . Multiple sclerosis ([place])   . Vision abnormalities     Past Surgical History:  Procedure Laterality Date  . COLPOSCOPY    . NO PAST SURGERIES    . WISDOM TOOTH EXTRACTION      There were no vitals filed for this visit.      Subjective Assessment - 10/03/16 1151    Pertinent History newly diagnosed MS, herpes, varicella   Patient Stated Goals improve balance   Currently in Pain? No/denies          Therapist checked progress towards goals and discussed progress with pt. Pt agrees with plans to d/c today. Reviewed green theraband HEP, pt returned demonstration 9169 reps each.  Therapistrecommended pt takes frequent rest breaks from computer work due to visual fatigue. Therapist recommended pt has clearance from MD for driving and that she practices with  A licensed driver before driving alone. Pt verbalized understanding.                      OT Short Term Goals - 10/03/16 0859      OT SHORT TERM GOAL #1   Title I with HEP-  due 10/11/16   Time 6   Period Weeks   Status Achieved     OT SHORT TERM GOAL #2   Title Pt will verbalize understanding of compensatory strategies for visual impairments   Time 6   Period Weeks   Status Achieved     OT SHORT TERM GOAL #3   Title Pt will demonstrate improved dynamic standing balance for ADLs as evidenced by increasing our standing functional reach to 13 inches, bilaterally   Time 6   Period Weeks   Status Achieved  RUE 14 in LUE 14 inches     OT SHORT TERM GOAL #4   Title Pt will perform all basic ADLS modified independently   Time 6   Period Weeks   Status Achieved     OT SHORT TERM GOAL #5   Title Pt will perform light home management/ cooking  at a supervision level.   Time 6   Period Weeks   Status Achieved     OT SHORT TERM GOAL #6   Title Pt will perform tabletop scanning and reading tasks without reports of diplopia.   Time 6   Period Weeks   Status Achieved           OT Long  Term Goals - 10/03/16 0901      OT LONG TERM GOAL #1   Title Pt will demonstrate adequate UE strength to retrieve a 3 lbs weight from ovehead shelf with each UE individually x 5 reps without dropping.-due 11/27/16   Time 12   Period Weeks   Status Achieved     OT LONG TERM GOAL #2   Title Pt will perform mod complex home management/ cooking at a modified independent level without LOB.   Time 12   Period Weeks   Status Achieved  per pt report     OT LONG TERM GOAL #3   Title Pt will verbalize understanding of energy conservation/ activity modification for ADLs/IADLs.   Time 12   Period Weeks   Status Achieved     OT LONG TERM GOAL #4   Title Pt will demonstrate improved LUE functional use as evidenced by improving  LUE 9 hole peg test score by 3 secs.   Baseline 32.60   Time 12   Period Weeks   Status Achieved  26.78 secs     OT LONG TERM GOAL #5   Title Pt will demonstrate ability to navigate a busy environment and locate items with 95% or better  accuracy and no reports of diplopia.   Time 12   Period Weeks   Status Achieved  Pt missed 1/15 initally, however she self corrected               Plan - 10/03/16 1152    Clinical Impression Statement Pt demonstrates excellent overall progress. she plans to return to work next week and requests d/c.   Rehab Potential Good   OT Frequency 2x / week   OT Duration 12 weeks   OT Treatment/Interventions Self-care/ADL training;Patient/family education;Balance training;Therapeutic exercises;Ultrasound;Therapeutic exercise;Therapeutic activities;Cognitive remediation/compensation;Passive range of motion;Neuromuscular education;Cryotherapy;Functional Mobility Training;Energy conservation;Manual Therapy;Visual/perceptual remediation/compensation   Plan d/c OT   Consulted and Agree with Plan of Care Patient      Patient will benefit from skilled therapeutic intervention in order to improve the following deficits and impairments:  Abnormal gait, Decreased knowledge of use of DME, Impaired vision/preception, Impaired sensation, Decreased mobility, Decreased coordination, Decreased activity tolerance, Decreased endurance, Decreased range of motion, Decreased strength, Decreased balance, Decreased knowledge of precautions, Decreased safety awareness, Difficulty walking, Impaired UE functional use  Visit Diagnosis: Muscle weakness (generalized)  Other lack of coordination  Visuospatial deficit  Other symptoms and signs involving the nervous system  Other disturbances of skin sensation  Unsteadiness on feet OCCUPATIONAL THERAPY DISCHARGE SUMMARY    Current functional level related to goals / functional outcomes: Pt met all goals. She demonstrates excellent progress.   Remaining deficits: Decreased endurance, visual deficits   Education / Equipment: Pt was educated regarding: energy conservation, HEP, importance of rest breaks to minimize visual fatigue. Pt verbalized understanding.   Plan: Patient agrees to discharge.  Patient goals were met. Patient is being discharged due to meeting the stated rehab goals.  ?????       Problem List Patient Active Problem List   Diagnosis Date Noted  . Gait disturbance 09/03/2016  . High risk medication use 09/03/2016  . Essential hypertension   . Numbness   . Multiple sclerosis ([place]) 08/20/2016  . Facial numbness 08/20/2016  . Vertigo 08/20/2016  . Headache 08/20/2016  . Double vision     Tamico Mundo 10/03/2016, 11:54 AM Theone Murdoch, OTR/L [phone] Phone: [phone] 11:57 AM 10/03/16 [facility] [address]  [place], [place], [address] Phone: [phone]   Fax:  [phone]  Name: Amanda Horne MRN: [id number] Date of Birth: April 21, 1986

## 2016-10-03 NOTE — Patient Instructions (Signed)
Gaze Stabilization: Tip Card  1.Target must remain in focus, not blurry, and appear stationary while head is in motion. 2.Perform exercises with small head movements (45 to either side of midline). 3.Increase speed of head motion so long as target is in focus. 4.If you wear eyeglasses, be sure you can see target through lens (therapist will give specific instructions for bifocal / progressive lenses). 5.These exercises may provoke dizziness or nausea. Work through these symptoms. If too dizzy, slow head movement slightly. Rest between each exercise. 6.Exercises demand concentration; avoid distractions. 7.For safety, perform standing exercises close to a counter, wall, corner, or next to someone.  Copyright  VHI. All rights reserved.  Gaze Stabilization: Standing Feet Apart (Compliant Surface)    Feet apart on pillow, keeping eyes on target on wall _6___ feet away, tilt head down 15-30 and move head side to side for __60__ seconds. Repeat while moving head up and down for _60___ seconds. Do _1-2___ sessions per day. Repeat using target on pattern background.  (DO AFTER COMPUTER SESSIONS AND SYMPTOMS PROVOKED WITH COMPUTER WORK)  Copyright  VHI. All rights reserved.

## 2016-10-03 NOTE — Telephone Encounter (Signed)
I have spoken with Amanda Horne this morning.  She has not returned to work yet- is participating in OT.  Had her first session yesterday and sts. looking at the computer causes eye fatigue.  She is going to continue therapy, and work note given allows her to go back to work for only 4 hrs. per day for 2 weeks, then to 8 hrs per day, and no more 12 hr. shifts.  If, after completing therapy, she still feels she is not able to return to work, she will call back and will check with RAS about a new work note./fim

## 2016-10-04 NOTE — Therapy (Signed)
[facility] [address] [place], [place], [address] Phone: [phone]   Fax:  [phone]  Physical Therapy Treatment  Patient Details  Name: Amanda Horne MRN: [id number] Date of Birth: 12-24-1985 Referring Provider: Irine Seal  ((Hospital)-pt to follow up with Dr. Felecia Shelling)  Encounter Date: 10/03/2016      PT End of Session - 10/04/16 1710    Visit Number 7   Number of Visits 17   Date for PT Re-Evaluation 10/27/16   Authorization Type UHC-60 visit limit combined PT and OT   Authorization - Visit Number 7   Authorization - Number of Visits 30   PT Start Time 0935   PT Stop Time 1015   PT Time Calculation (min) 40 min      Past Medical History:  Diagnosis Date  . Abnormal Pap smear   . Herpes   . Hx of varicella   . Hypertension   . Multiple sclerosis ([place])   . Vision abnormalities     Past Surgical History:  Procedure Laterality Date  . COLPOSCOPY    . NO PAST SURGERIES    . WISDOM TOOTH EXTRACTION      There were no vitals filed for this visit.      Subjective Assessment - 10/04/16 1708    Subjective Pt is requesting discharge - says she is going back to work next week   Patient Stated Goals Pt's goals for PT are to function better with walking, to get stronger, to not use walker.  (Has been using walker since hospitalization)   Currently in Pain? No/denies         NeuroRe-ed; x1 viewing in standing - plain background initially used - 1" horizontal and 1" vertical;  Then patterned background used - 1" horizontal and 1" vertical head turns - educated pt in this exercise for HEP     Self Care:  Reviewed LTG's, progress, HEP; MS resources           [facility] Adult PT Treatment/Exercise - 10/04/16 0001      Ambulation/Gait   Gait velocity 8.69 secs   Stairs Yes   Stair Management Technique No rails;Forwards;Alternating pattern   Number of Stairs 4   Height of Stairs 6                 PT Education - 10/04/16 1709    Education provided Yes   Education Details x1 viewing exercise in standing   Person(s) Educated Patient   Methods Explanation;Demonstration;Handout   Comprehension Verbalized understanding;Returned demonstration          PT Short Term Goals - 10/01/16 1457      PT SHORT TERM GOAL #1   Title Pt will be independent with HEP for improved balance, strength and gait.  TARGET 09/27/16   Status Achieved     PT SHORT TERM GOAL #2   Title Pt will improve TUG score to less than or equal to 20 seconds for decreased fall risk.   Baseline 9.12 secs with no device   Status Achieved     PT SHORT TERM GOAL #3   Title Berg Balance test to be assessed, with score to improve by at least 8 points for decreased fall risk.   Status Achieved     PT SHORT TERM GOAL #4   Title Pt will perform sit<>stand transfers with minimal to no UE support, 8 of 10 trials, for improved transfer efficiency and functional strengthening.   Baseline  10/10 times with no UE support    Status Achieved     PT SHORT TERM GOAL #5   Title Pt will verbalize understanding of local MS resources.   Status Achieved           PT Long Term Goals - 10/04/16 1710      PT LONG TERM GOAL #1   Title Pt will verbalize understanding of fall prevention in home environment.  TARGET 10/27/16   Status Achieved     PT LONG TERM GOAL #2   Title Pt will improve TUG score to less than or equal to 13.5 seconds for decreased fall risk.   Baseline 9.12 secs without device   Status Achieved     PT LONG TERM GOAL #3   Title Pt will improve gait velocity to at least 1.8 ft/sec for decreased fall risk, improved efficiency and safety with gait.   Baseline 8.69= 3.77 ft/sec   Status Achieved     PT LONG TERM GOAL #4   Title Pt will ambulate at least 500 ft, least restrictive assistive device, modified independently, for improved household and community gait.   Status Achieved     PT  LONG TERM GOAL #5   Title Pt will negotiate at least 4 steps, using one handrail, modified independently, for improved stair negotiation for home.   Status Achieved               Plan - 10/04/16 1712    Clinical Impression Statement Pt has made excellent progress - has met all LTG's - pt  reports she is returning to work on Monday   Rehab Potential Good   PT Frequency 2x / week   PT Duration 8 weeks   PT Treatment/Interventions ADLs/Self Care Home Management;Functional mobility training;Stair training;Gait training;DME Instruction;Therapeutic activities;Therapeutic exercise;Balance training;Neuromuscular re-education;Patient/family education;Orthotic Fit/Training   PT Next Visit Plan N/A - D/C   Consulted and Agree with Plan of Care Patient      Patient will benefit from skilled therapeutic intervention in order to improve the following deficits and impairments:  Abnormal gait, Decreased activity tolerance, Decreased balance, Decreased mobility, Decreased knowledge of use of DME, Decreased endurance, Decreased strength, Difficulty walking, Postural dysfunction, Impaired vision/preception  Visit Diagnosis: Other abnormalities of gait and mobility  Muscle weakness (generalized)     Problem List Patient Active Problem List   Diagnosis Date Noted  . Gait disturbance 09/03/2016  . High risk medication use 09/03/2016  . Essential hypertension   . Numbness   . Multiple sclerosis ([place]) 08/20/2016  . Facial numbness 08/20/2016  . Vertigo 08/20/2016  . Headache 08/20/2016  . Double vision     PHYSICAL THERAPY DISCHARGE SUMMARY  Visits from Start of Care: 7  Current functional level related to goals / functional outcomes: See above for progress towards LTG's - pt has made remarkable progress   Remaining deficits: Slightly decreased high level balance skills   Education / Equipment: Pt has been instructed in a HEP for LE strengthening; pt has been given info on MS  aquatic ex. Class at [facility]; also gave info on MS [employer] magazine Plan: Patient agrees to discharge.  Patient goals were met. Patient is being discharged due to meeting the stated rehab goals.  ?????       Alda Lea, PT 10/04/2016, 5:16 PM  [facility] [address] [place], [place], [address] Phone: [phone]   Fax:  [phone]  Name: Amanda Horne  MRN: [id number] Date of Birth: [birth year]

## 2016-10-08 ENCOUNTER — Encounter: Payer: 59 | Admitting: Occupational Therapy

## 2016-10-08 ENCOUNTER — Ambulatory Visit: Payer: 59 | Admitting: Physical Therapy

## 2016-10-10 ENCOUNTER — Ambulatory Visit: Payer: 59 | Admitting: Physical Therapy

## 2016-10-10 ENCOUNTER — Encounter: Payer: 59 | Admitting: Occupational Therapy

## 2016-10-15 ENCOUNTER — Encounter: Payer: 59 | Admitting: Occupational Therapy

## 2016-10-15 ENCOUNTER — Ambulatory Visit: Payer: 59 | Admitting: Physical Therapy

## 2016-10-17 ENCOUNTER — Encounter: Payer: 59 | Admitting: Occupational Therapy

## 2016-10-17 ENCOUNTER — Ambulatory Visit: Payer: 59 | Admitting: Physical Therapy

## 2016-10-22 ENCOUNTER — Telehealth: Payer: Self-pay | Admitting: Neurology

## 2016-10-22 ENCOUNTER — Ambulatory Visit: Payer: 59 | Admitting: Physical Therapy

## 2016-10-22 ENCOUNTER — Encounter: Payer: 59 | Admitting: Occupational Therapy

## 2016-10-22 MED ORDER — BACLOFEN 10 MG PO TABS: ORAL_TABLET | ORAL | 3 refills | Status: DC

## 2016-10-22 NOTE — Telephone Encounter (Signed)
Pt called because for the last 2 weeks or so when pt wakes up in the middle of the night her hands, arms and elbows feel numb. In the morning when she wakes up her legs feel numb.  Pt said she was on baclofen but is out, she would like a call on what Dr Epimenio Foot suggest she do.

## 2016-10-22 NOTE — Addendum Note (Signed)
Addended by: Candis Schatz I on: 10/22/2016 04:00 PM   Modules accepted: Orders

## 2016-10-22 NOTE — Telephone Encounter (Signed)
I have spoken with pt. this afternoon. She is having intermittent, positional numbness in legs/arms, which resolves on its own.  Having more leg cramps since running out of Baclofen.  Baclofen rx. escribed to [facility] per her request, and she will call back if new problems/fim

## 2016-10-24 ENCOUNTER — Ambulatory Visit: Payer: 59 | Admitting: Physical Therapy

## 2016-10-24 ENCOUNTER — Encounter: Payer: 59 | Admitting: Occupational Therapy

## 2016-11-01 DIAGNOSIS — Z0289 Encounter for other administrative examinations: Secondary | ICD-10-CM

## 2016-11-12 ENCOUNTER — Ambulatory Visit (INDEPENDENT_AMBULATORY_CARE_PROVIDER_SITE_OTHER): Payer: 59 | Admitting: Neurology

## 2016-11-12 ENCOUNTER — Encounter: Payer: Self-pay | Admitting: Neurology

## 2016-11-12 VITALS — BP 132/78 | HR 76 | Resp 16 | Ht 63.0 in | Wt 190.5 lb

## 2016-11-12 DIAGNOSIS — R269 Unspecified abnormalities of gait and mobility: Secondary | ICD-10-CM | POA: Diagnosis not present

## 2016-11-12 DIAGNOSIS — Z79899 Other long term (current) drug therapy: Secondary | ICD-10-CM

## 2016-11-12 DIAGNOSIS — H532 Diplopia: Secondary | ICD-10-CM

## 2016-11-12 DIAGNOSIS — G35 Multiple sclerosis: Secondary | ICD-10-CM

## 2016-11-12 DIAGNOSIS — G35D Multiple sclerosis, unspecified: Secondary | ICD-10-CM

## 2016-11-12 DIAGNOSIS — R200 Anesthesia of skin: Secondary | ICD-10-CM

## 2016-11-12 MED ORDER — BACLOFEN 20 MG PO TABS: ORAL_TABLET | ORAL | 11 refills | Status: DC

## 2016-11-12 NOTE — Progress Notes (Signed)
[facility]  PATIENT: Amanda Horne DOB: [birth year]  REFERRING DOCTOR OR PCP:  Elfredia Nevins SOURCE: patient, notes from Dr. Sherwood Gambler, results on EMR, MRI images on PACS  _________________________________   HISTORICAL  CHIEF COMPLAINT:  Chief Complaint  Patient presents with  . Multiple Sclerosis    Sts. she continues to tolerate Tysabri with occasional nausea.  JCV ab last checked 09/03/16 and was Indeterminate at 0.24.  Inhibition assay was Negative.  She c/o continued generalized muscle cramps. PCP has given compounded cream ( Diclofenac Na 3%, Baclofen 2%, Gabapentin 5%, Lidocaine 5% and Menthol 1%) that she feels helps.  She gets this filled at [facility]    HISTORY OF PRESENT ILLNESS:   Amanda Horne Is a 31 year old woman who was diagnosed with multiple sclerosis in April 2018.[name] She started Tysabri and has had 2 doses.   In late March, 2018, she had severe vomiting and dizziness.   She went to the ED and was given fluids and Zofran.   A couple days later, she noes the right eye wandered to the right and the left eye had jerking.   She also had right hand numbness and slurred speech.     She went back to the ED 08/20/2016 and MRI was worrisome for MS.   She was admitted and received 5 days of IV Solu-Medrol.     She improved but not to baseline.    She had headaches and returned to the ED and BP was elevated.   She was started on Losartan.       In retrospect, in 2016,  she had phasic muscle spasms associated with weakness on her right side lasting 30-90 seconds.   As she was having a lot of stress at the time, anxiety was first suspected.   Symptoms improved over a few weeks but she has had occasional spasms.    Yesterday, the left arm started to have numbness,    Gait/balance/strength:    She is doing better with improved balance and gait.   She did have one fall going downstairs when her leg buckled.    She feels balance is a little off in the  mornings.   Her right leg feels slightly weak.  She notes a cramping sensation in both legs. Baclofen has helped some.  Sensation:   She notes occasional positional numbness in her arms but nothing stays.  Bladder:    She denies any significant problems with bladder or bowel function.  Vision:    Her diplopia completely resolved.  She notes no optic neuritis or blurry vision.   She has occasional headaches with eye pain but they are not as bad as in April.     Fatigue/Sleep:    She notes mild fatigue, unchanged by Tysabri so far.   She sleeps well many nights but usually wakes up once a night, sometimes taking a while to fall back asleep.     Mood:   She denies depression and anxiety.   She denies depression but has had some anxiety with all her symptoms over the last month. She denies any cognitive issues.    I personally reviewed the MRI of the brain dated 08/20/2016. In the posterior fossa there are right cerebellar focus adjacent to the fourth ventricle and a left posterior pontomedullary focus.   There are many T2/FLAIR hyperintense foci or ventricular, juxtacortical and deep white matter consistent with MS. 2 foci, one in the right temporal region and one in  the left frontal region enhance after contrast.  REVIEW OF SYSTEMS: Constitutional: No fevers, chills, sweats, or change in appetite Eyes: No visual changes, double vision, eye pain Ear, nose and throat: No hearing loss, ear pain, nasal congestion, sore throat Cardiovascular: No chest pain, palpitations Respiratory: No shortness of breath at rest or with exertion.   No wheezes GastrointestinaI: No nausea, vomiting, diarrhea, abdominal pain, fecal incontinence Genitourinary: No dysuria, urinary retention or frequency.  No nocturia. Musculoskeletal: No neck pain, back pain Integumentary: No rash, pruritus, skin lesions Neurological: as above Psychiatric: No depression at this time.  No anxiety Endocrine: No palpitations,  diaphoresis, change in appetite, change in weigh or increased thirst Hematologic/Lymphatic: No anemia, purpura, petechiae. Allergic/Immunologic: No itchy/runny eyes, nasal congestion, recent allergic reactions, rashes  ALLERGIES: No Known Allergies  HOME MEDICATIONS:  Current Outpatient Prescriptions:  .  baclofen (LIORESAL) 20 MG tablet, Take 3 to 4 pills a day for spasticity., Disp: 120 tablet, Rfl: 11 .  LORazepam (ATIVAN) 1 MG tablet, Take 2 - 3 po before the MRI, Disp: 3 tablet, Rfl: 0 .  losartan-hydrochlorothiazide (HYZAAR) 100-25 MG tablet, Take 1 tablet by mouth daily., Disp: , Rfl:  .  MedroxyPROGESTERone Acetate (DEPO-PROVERA IM), Inject into the muscle every 3 (three) months., Disp: , Rfl:  .  natalizumab (TYSABRI) 300 MG/15ML injection, Inject 300 mg into the vein every 30 (thirty) days., Disp: , Rfl:   PAST MEDICAL HISTORY: Past Medical History:  Diagnosis Date  . Abnormal Pap smear   . Herpes   . Hx of varicella   . Hypertension   . Multiple sclerosis (HCC)   . Vision abnormalities     PAST SURGICAL HISTORY: Past Surgical History:  Procedure Laterality Date  . COLPOSCOPY    . NO PAST SURGERIES    . WISDOM TOOTH EXTRACTION      FAMILY HISTORY: Family History  Problem Relation Age of Onset  . Hypertension Mother   . Diabetes Mother   . Diabetes Sister   . Other Paternal Uncle        Wiskott Aldnch syndrome, died as a child  . Healthy Father     SOCIAL HISTORY:  Social History   Social History  . Marital status: Single    Spouse name: N/A  . Number of children: N/A  . Years of education: N/A   Occupational History  . Not on file.   Social History Main Topics  . Smoking status: Never Smoker  . Smokeless tobacco: Never Used  . Alcohol use No  . Drug use: No  . Sexual activity: Yes    Birth control/ protection: None   Other Topics Concern  . Not on file   Social History Narrative  . No narrative on file     PHYSICAL EXAM  Vitals:    11/12/16 1608  BP: 132/78  Pulse: 76  Resp: 16  Weight: 190 lb 8 oz (86.4 kg)  Height: 5\' 3"  (1.6 m)    Body mass index is 33.75 kg/m.   General: The patient is well-developed and well-nourished and in no acute distress   Neurologic Exam  Mental status: The patient is alert and oriented x 3 at the time of the examination. The patient has apparent normal recent and remote memory, with an apparently normal attention span and concentration ability.   Speech is normal.  Cranial nerves: Extraocular movements are full.   Facial strength and sensation is normal. l.  Trapezius and sternocleidomastoid strength is normal. No dysarthria  is noted.  The tongue is midline, and the patient has symmetric elevation of the soft palate. No obvious hearing deficits are noted.  Motor:  Muscle bulk is normal.   Muscle tone was normal. Strength was 5/5 in the arms and legs..   Sensory: Sensory testing is intact to touch and vibration sensation in all 4 extremities.  Coordination: There was slightly reduced finger nose finger on the right. Heel-to-shin was normal.  Gait and station: Station is normal.   Gait is much better and practically normal. Tandem gait is still wide. Romberg sign is negative   Reflexes: Deep tendon reflexes are increased in the legs with spread at the knees. She no longer has clonus at the ankles per   DIAGNOSTIC DATA (LABS, IMAGING, TESTING) - I reviewed patient records, labs, notes, testing and imaging myself where available.  Lab Results  Component Value Date   WBC 15.3 (H) 08/23/2016   HGB 11.8 (L) 08/23/2016   HCT 36.9 08/23/2016   MCV 75.8 (L) 08/23/2016   PLT 244 08/23/2016      Component Value Date/Time   NA 138 08/24/2016 0523   K 4.1 08/24/2016 0523   CL 104 08/24/2016 0523   CO2 25 08/24/2016 0523   GLUCOSE 109 (H) 08/24/2016 0523   BUN 14 08/24/2016 0523   CREATININE 0.57 08/24/2016 0523   CALCIUM 9.0 08/24/2016 0523   PROT 6.8 09/03/2016 1423    ALBUMIN 4.2 09/03/2016 1423   AST 21 09/03/2016 1423   ALT 29 09/03/2016 1423   ALKPHOS 56 09/03/2016 1423   BILITOT 0.3 09/03/2016 1423   GFRNONAA >60 08/24/2016 0523   GFRAA >60 08/24/2016 0523    Lab Results  Component Value Date   VITAMINB12 1,718 (H) 08/21/2016        ASSESSMENT AND PLAN  Multiple sclerosis (HCC)  Double vision  Gait disturbance  High risk medication use  Numbness    1.   Tysabri 300 mg every 4 weeks.    She was JCV antibody negative when tested and this will be repeated in 4 months. 2.    Increase the baclofen to up to 80 mg daily. 3.    Continue to be active and exercises tolerated. 4.   She will return to see me in 4 months or sooner if there are new or worsening neurologic symptoms.     Richard A. Epimenio Foot, MD, PhD 11/12/2016, 4:59 PM Certified in Neurology, Clinical Neurophysiology, Sleep Medicine, Pain Medicine and Neuroimaging  [facility] [address], [address] [place], [place] [address] [phone].

## 2016-11-29 ENCOUNTER — Other Ambulatory Visit: Payer: Self-pay | Admitting: Neurology

## 2016-11-29 MED ORDER — TIZANIDINE HCL 4 MG PO TABS: ORAL_TABLET | ORAL | 5 refills | Status: DC

## 2016-12-03 ENCOUNTER — Encounter: Payer: Self-pay | Admitting: Neurology

## 2016-12-03 ENCOUNTER — Telehealth: Payer: Self-pay | Admitting: Neurology

## 2016-12-03 ENCOUNTER — Ambulatory Visit (INDEPENDENT_AMBULATORY_CARE_PROVIDER_SITE_OTHER): Payer: 59 | Admitting: Neurology

## 2016-12-03 VITALS — BP 126/83 | HR 85 | Resp 16 | Ht 63.0 in | Wt 198.0 lb

## 2016-12-03 DIAGNOSIS — R5383 Other fatigue: Secondary | ICD-10-CM | POA: Diagnosis not present

## 2016-12-03 DIAGNOSIS — G4700 Insomnia, unspecified: Secondary | ICD-10-CM | POA: Diagnosis not present

## 2016-12-03 DIAGNOSIS — Z79899 Other long term (current) drug therapy: Secondary | ICD-10-CM | POA: Diagnosis not present

## 2016-12-03 DIAGNOSIS — G35 Multiple sclerosis: Secondary | ICD-10-CM | POA: Diagnosis not present

## 2016-12-03 DIAGNOSIS — R269 Unspecified abnormalities of gait and mobility: Secondary | ICD-10-CM | POA: Diagnosis not present

## 2016-12-03 DIAGNOSIS — F418 Other specified anxiety disorders: Secondary | ICD-10-CM

## 2016-12-03 DIAGNOSIS — R519 Headache, unspecified: Secondary | ICD-10-CM

## 2016-12-03 DIAGNOSIS — R51 Headache: Secondary | ICD-10-CM

## 2016-12-03 DIAGNOSIS — G35D Multiple sclerosis, unspecified: Secondary | ICD-10-CM

## 2016-12-03 MED ORDER — PHENTERMINE HCL 37.5 MG PO CAPS: 37.5000 mg | ORAL_CAPSULE | ORAL | 5 refills | Status: DC

## 2016-12-03 NOTE — Telephone Encounter (Signed)
Patient had infusion on 11/29/16 was advised to call back if not feeling better to receive steriods.  Patient called states she is weak, trouble walking, some aching, and headaches.  Patient was not sure if she was to speak with infusion suite or RN.  Please call

## 2016-12-03 NOTE — Progress Notes (Signed)
[facility]  PATIENT: Amanda Horne DOB: Sep 04, 1985  REFERRING DOCTOR OR PCP:  Elfredia Nevins SOURCE: patient, notes from Dr. Sherwood Gambler, results on EMR, MRI images on PACS  _________________________________   HISTORICAL  CHIEF COMPLAINT:  Chief Complaint  Patient presents with  . Multiple Sclerosis    Here today with c/o increased fatigue, leg weakness, h/a's over the last week.  Last Tysabri was 11/29/16/[facility]    HISTORY OF PRESENT ILLNESS:  Amanda Horne Is a 31 year old woman who was diagnosed with multiple sclerosis in April 2018.[name] She started Tysabri and has had 3 infusions.     Over the last week, she has had much more muscle aching due to muscle stiffness, right worse than left. .    She notes a little more weakness as well.   Tylenol and other OTC med's have note helped.    Tizanidine has helped the spasticity mildly.    She is on baclofen 20 mg qid.   She also notes some headache.  She notes mild occipital tenderness but more tenderness in the vertex area.    She denies any change in vision .     She has more trouble staying asleep (falls asleep ok at first).  She is waking up around 4 am and then can't fall back asleep for 30 minutes or so.   If she hears noises, she wakes up and then may have difficulty falling asleep.   She feels she gets 5 1/2 hours of sleep.    She sleeps in a recliner because of vertigo if she lays flat.    Gait/balance/strength:    She is doing better with improved balance and gait.   She did have one fall going downstairs when her leg buckled.    She feels balance is a little off in the mornings.   Her right leg feels slightly weak.  She has a crampy sensation in her muscles. Baclofen initially helped but she was having more pain and tizanidine was added. She thinks he tizanidine may be helping a little bit..  Sensation:   She has had some numbness in her arms but it has mostly been transient   Bladder:    She denies any significant  problems with bladder or bowel function.  Vision:    She initially had diplopia at diagnosis. This has completely resolved.      Fatigue/Sleep:    She notes mild fatigue, unchanged by Tysabri so far.   She sleeps well many nights but usually wakes up once a night, sometimes taking a while to fall back asleep.     Mood:   She denies depression and anxiety.   She denies depression but has had some anxiety with all her symptoms over the last month. She denies any cognitive issues.    Weight:  She is getting more than 20 pounds since her diagnosis. She also feels more tired with her increased weight. She does not snore.  MS History:   In late March, 2018, she had severe vomiting and dizziness.   She went to the ED and was given fluids and Zofran.   A couple days later, she noes the right eye wandered to the right and the left eye had jerking.   She also had right hand numbness and slurred speech.     She went back to the ED 08/20/2016 and MRI was worrisome for MS.   She was admitted and received 5 days of IV Solu-Medrol.  She improved but not to baseline.   In retrospect, in 2016,  she had phasic muscle spasms associated with weakness on her right side lasting 30-90 seconds.  MRI of the brain dated 08/20/2016 shows in the posterior fossa there are right cerebellar focus adjacent to the fourth ventricle and a left posterior pontomedullary focus.   There are many T2/FLAIR hyperintense foci or ventricular, juxtacortical and deep white matter consistent with MS. 2 foci, one in the right temporal region and one in the left frontal region enhance after contrast.  REVIEW OF SYSTEMS: Constitutional: No fevers, chills, sweats, or change in appetite Eyes: No visual changes, double vision, eye pain Ear, nose and throat: No hearing loss, ear pain, nasal congestion, sore throat Cardiovascular: No chest pain, palpitations Respiratory: No shortness of breath at rest or with exertion.   No wheezes GastrointestinaI:  No nausea, vomiting, diarrhea, abdominal pain, fecal incontinence Genitourinary: No dysuria, urinary retention or frequency.  No nocturia. Musculoskeletal: No neck pain, back pain Integumentary: No rash, pruritus, skin lesions Neurological: as above Psychiatric: No depression at this time.  No anxiety Endocrine: No palpitations, diaphoresis, change in appetite, change in weigh or increased thirst Hematologic/Lymphatic: No anemia, purpura, petechiae. Allergic/Immunologic: No itchy/runny eyes, nasal congestion, recent allergic reactions, rashes  ALLERGIES: No Known Allergies  HOME MEDICATIONS:  Current Outpatient Prescriptions:  .  baclofen (LIORESAL) 20 MG tablet, Take 3 to 4 pills a day for spasticity., Disp: 120 tablet, Rfl: 11 .  LORazepam (ATIVAN) 1 MG tablet, Take 2 - 3 po before the MRI, Disp: 3 tablet, Rfl: 0 .  losartan-hydrochlorothiazide (HYZAAR) 100-25 MG tablet, Take 1 tablet by mouth daily., Disp: , Rfl:  .  MedroxyPROGESTERone Acetate (DEPO-PROVERA IM), Inject into the muscle every 3 (three) months., Disp: , Rfl:  .  natalizumab (TYSABRI) 300 MG/15ML injection, Inject 300 mg into the vein every 30 (thirty) days., Disp: , Rfl:  .  tiZANidine (ZANAFLEX) 4 MG tablet, For 2 weeks take one half pill 3 times a day then increase to one pill 3 times a day, Disp: 90 tablet, Rfl: 5 .  phentermine 37.5 MG capsule, Take 1 capsule (37.5 mg total) by mouth every morning., Disp: 30 capsule, Rfl: 5  PAST MEDICAL HISTORY: Past Medical History:  Diagnosis Date  . Abnormal Pap smear   . Herpes   . Hx of varicella   . Hypertension   . Multiple sclerosis (HCC)   . Vision abnormalities     PAST SURGICAL HISTORY: Past Surgical History:  Procedure Laterality Date  . COLPOSCOPY    . NO PAST SURGERIES    . WISDOM TOOTH EXTRACTION      FAMILY HISTORY: Family History  Problem Relation Age of Onset  . Hypertension Mother   . Diabetes Mother   . Diabetes Sister   . Other Paternal  Uncle        Wiskott Aldnch syndrome, died as a child  . Healthy Father     SOCIAL HISTORY:  Social History   Social History  . Marital status: Single    Spouse name: N/A  . Number of children: N/A  . Years of education: N/A   Occupational History  . Not on file.   Social History Main Topics  . Smoking status: Never Smoker  . Smokeless tobacco: Never Used  . Alcohol use No  . Drug use: No  . Sexual activity: Yes    Birth control/ protection: None   Other Topics Concern  . Not on file  Social History Narrative  . No narrative on file     PHYSICAL EXAM  Vitals:   12/03/16 0939  BP: 126/83  Pulse: 85  Resp: 16  Weight: 198 lb (89.8 kg)  Height: 5\' 3"  (1.6 m)    Body mass index is 35.07 kg/m.   General: The patient is well-developed and well-nourished and in no acute distress   Neurologic Exam  Mental status: The patient is alert and oriented x 3 at the time of the examination. The patient has apparent normal recent and remote memory, with an apparently normal attention span and concentration ability.   Speech is normal.  Cranial nerves: Extraocular movements are full.   Facial strength and sensation is normal. l.  Trapezius and sternocleidomastoid strength is normal. No dysarthria is noted.  The tongue is midline, and the patient has symmetric elevation of the soft palate. No obvious hearing deficits are noted.  Motor:  Muscle bulk is normal.   Muscle tone was normal. Strength was 5/5 in the arms and legs..   Sensory: Sensory testing is intact to touch and vibration sensation in all 4 extremities.  Coordination: There was slightly reduced finger nose finger on the right. Heel-to-shin was normal.  Gait and station: Station is normal.   Regular gait is normal. Her tandem gait is wide. Romberg sign Is Negative.   Reflexes: Deep tendon reflexes are increased in the legs with spread at the knees. She no longer has clonus at the ankles.      DIAGNOSTIC  DATA (LABS, IMAGING, TESTING) - I reviewed patient records, labs, notes, testing and imaging myself where available.  Lab Results  Component Value Date   WBC 15.3 (H) 08/23/2016   HGB 11.8 (L) 08/23/2016   HCT 36.9 08/23/2016   MCV 75.8 (L) 08/23/2016   PLT 244 08/23/2016      Component Value Date/Time   NA 138 08/24/2016 0523   K 4.1 08/24/2016 0523   CL 104 08/24/2016 0523   CO2 25 08/24/2016 0523   GLUCOSE 109 (H) 08/24/2016 0523   BUN 14 08/24/2016 0523   CREATININE 0.57 08/24/2016 0523   CALCIUM 9.0 08/24/2016 0523   PROT 6.8 09/03/2016 1423   ALBUMIN 4.2 09/03/2016 1423   AST 21 09/03/2016 1423   ALT 29 09/03/2016 1423   ALKPHOS 56 09/03/2016 1423   BILITOT 0.3 09/03/2016 1423   GFRNONAA >60 08/24/2016 0523   GFRAA >60 08/24/2016 0523    Lab Results  Component Value Date   VITAMINB12 1,718 (H) 08/21/2016        ASSESSMENT AND PLAN  Multiple sclerosis (HCC)  Gait disturbance  Nonintractable headache, unspecified chronicity pattern, unspecified headache type  High risk medication use  Insomnia, unspecified type  Depression with anxiety  Morbid obesity (HCC)  Other fatigue    1.   Continue Tysabri 300 mg every 4 weeks.    She was JCV antibody negative when tested and this will be repeated at her next visit. 2.   Continue baclofen to up to 80 mg daily.   Add tizanidine and titrate to 4 mg po tid.    3.   Phentermine for weight loss and fatigue.    Consider an SSRI if anxiety worsens.  4.    Continue to be active and exercises tolerated. 5.     Keep appt in 3- 4 months or sooner if there are new or worsening neurologic symptoms   Donovan Gatchel A. Epimenio Foot, MD, PhD 12/03/2016, 10:07 AM Certified in  Neurology, Clinical Neurophysiology, Sleep Medicine, Pain Medicine and Neuroimaging  [facility] [address] [place], [place] [address] [phone].

## 2016-12-03 NOTE — Telephone Encounter (Signed)
I have spoken with Amanda Horne this morning.  She c/o increased fatigue, leg weakness, h/a's over the last week.  Sx, about the same since onset, not getting worse.  Appt. given with Sater this am/[facility]im

## 2017-01-22 ENCOUNTER — Other Ambulatory Visit: Payer: Self-pay | Admitting: Obstetrics and Gynecology

## 2017-01-22 ENCOUNTER — Telehealth: Payer: Self-pay | Admitting: Neurology

## 2017-01-22 NOTE — Telephone Encounter (Signed)
Spoke with Poet.  She is alert and oriented times 4 this am, appropriate on the phone.  She c/o occasional visual hallucinations, onset 2 wks. ago.  Sts. she believes she sees someone at her door, when actually nobody is there.  Not having halluncinations now.  Also c/o increase in h/a's. Appt. offered tomorrow (01/23/17) but pt. declined, citing work requirements.  Per her request, appt. given for 01/29/17/[facility]

## 2017-01-22 NOTE — Telephone Encounter (Signed)
Patient called office in reference to having new symptoms of hallucinations at night time and really bad headaches starting a couple of weeks.  Pharmacy-  [facility].  Please call

## 2017-01-23 ENCOUNTER — Ambulatory Visit: Payer: Self-pay | Admitting: Neurology

## 2017-01-29 ENCOUNTER — Ambulatory Visit (INDEPENDENT_AMBULATORY_CARE_PROVIDER_SITE_OTHER): Payer: 59 | Admitting: Neurology

## 2017-01-29 ENCOUNTER — Encounter: Payer: Self-pay | Admitting: Neurology

## 2017-01-29 VITALS — BP 149/105 | HR 73 | Resp 16 | Ht 63.0 in | Wt 184.0 lb

## 2017-01-29 DIAGNOSIS — R5383 Other fatigue: Secondary | ICD-10-CM | POA: Diagnosis not present

## 2017-01-29 DIAGNOSIS — G4700 Insomnia, unspecified: Secondary | ICD-10-CM | POA: Diagnosis not present

## 2017-01-29 DIAGNOSIS — R519 Headache, unspecified: Secondary | ICD-10-CM

## 2017-01-29 DIAGNOSIS — R51 Headache: Secondary | ICD-10-CM

## 2017-01-29 DIAGNOSIS — M542 Cervicalgia: Secondary | ICD-10-CM | POA: Diagnosis not present

## 2017-01-29 DIAGNOSIS — G35 Multiple sclerosis: Secondary | ICD-10-CM

## 2017-01-29 DIAGNOSIS — Z79899 Other long term (current) drug therapy: Secondary | ICD-10-CM | POA: Diagnosis not present

## 2017-01-29 DIAGNOSIS — R269 Unspecified abnormalities of gait and mobility: Secondary | ICD-10-CM | POA: Diagnosis not present

## 2017-01-29 MED ORDER — IMIPRAMINE HCL 25 MG PO TABS: 25.0000 mg | ORAL_TABLET | Freq: Every day | ORAL | 5 refills | Status: DC
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
## 2017-01-30 ENCOUNTER — Telehealth: Payer: Self-pay | Admitting: *Deleted

## 2017-01-30 LAB — CBC WITH DIFFERENTIAL/PLATELET
BASOS ABS: 0 10*3/uL (ref 0.0–0.2)
Basos: 0 %
EOS (ABSOLUTE): 0.2 10*3/uL (ref 0.0–0.4)
Eos: 2 %
Hematocrit: 37.6 % (ref 34.0–46.6)
Hemoglobin: 12.2 g/dL (ref 11.1–15.9)
IMMATURE GRANULOCYTES: 0 %
Immature Grans (Abs): 0 10*3/uL (ref 0.0–0.1)
LYMPHS: 39 %
Lymphocytes Absolute: 4.5 10*3/uL — ABNORMAL HIGH (ref 0.7–3.1)
MCH: 24.5 pg — ABNORMAL LOW (ref 26.6–33.0)
MCHC: 32.4 g/dL (ref 31.5–35.7)
MCV: 76 fL — ABNORMAL LOW (ref 79–97)
Monocytes Absolute: 0.6 10*3/uL (ref 0.1–0.9)
Monocytes: 5 %
NEUTROS PCT: 54 %
Neutrophils Absolute: 6.2 10*3/uL (ref 1.4–7.0)
PLATELETS: 245 10*3/uL (ref 150–379)
RBC: 4.98 x10E6/uL (ref 3.77–5.28)
RDW: 15.2 % (ref 12.3–15.4)
WBC: 11.5 10*3/uL — AB (ref 3.4–10.8)

## 2017-01-30 LAB — VITAMIN D 25 HYDROXY (VIT D DEFICIENCY, FRACTURES): VIT D 25 HYDROXY: 12.2 ng/mL — AB (ref 30.0–100.0)

## 2017-01-30 MED ORDER — VITAMIN D (ERGOCALCIFEROL) 1.25 MG (50000 UNIT) PO CAPS: 50000.0000 [IU] | ORAL_CAPSULE | ORAL | 1 refills | Status: DC

## 2017-01-30 MED FILL — VIT D2 1.25 MG (50,000 UNIT: 1.25 MG | 84 days supply | Qty: 12 | Fill #0

## 2017-01-30 NOTE — Telephone Encounter (Signed)
LMOM for pt. with below lab results, rx. sent to [facility].  Pt. to call if she has any questions/fim

## 2017-01-30 NOTE — Telephone Encounter (Signed)
-----   Message from Asa Lente, MD sent at 01/30/2017  3:09 PM EDT ----- Please let her know that the vitamin D is very low. I would like her to take 50,000 units weekly for 6 months and then take 5000 units daily over-the-counter

## 2017-02-04 ENCOUNTER — Other Ambulatory Visit: Payer: Self-pay | Admitting: *Deleted

## 2017-02-04 ENCOUNTER — Telehealth: Payer: Self-pay | Admitting: *Deleted

## 2017-02-04 DIAGNOSIS — G35 Multiple sclerosis: Secondary | ICD-10-CM

## 2017-02-04 DIAGNOSIS — Z79899 Other long term (current) drug therapy: Secondary | ICD-10-CM

## 2017-02-04 NOTE — Telephone Encounter (Signed)
Spoke with Amanda Horne this morning.  When labs were drawn on 01/29/17, a JCV ab was drawn to go to [facility]. [facility] never picked the specimen up, so it will need to be redrawn.  Pt. agreeable, will come in tomorrow.  New order in EPIC/fim

## 2017-02-05 ENCOUNTER — Other Ambulatory Visit (INDEPENDENT_AMBULATORY_CARE_PROVIDER_SITE_OTHER): Payer: Self-pay

## 2017-02-05 ENCOUNTER — Encounter: Payer: Self-pay | Admitting: Neurology

## 2017-02-05 DIAGNOSIS — Z0289 Encounter for other administrative examinations: Secondary | ICD-10-CM

## 2017-02-14 ENCOUNTER — Encounter: Payer: Self-pay | Admitting: *Deleted

## 2017-03-12 ENCOUNTER — Ambulatory Visit: Payer: 59 | Admitting: Neurology

## 2017-03-17 ENCOUNTER — Encounter: Payer: Self-pay | Admitting: *Deleted

## 2017-04-22 MED FILL — VIT D2 1.25 MG (50,000 UNIT: 1.25 MG | 84 days supply | Qty: 12 | Fill #1

## 2017-04-23 MED FILL — IMIPRAMINE HCL 25 MG TABLET: 25 | 30 days supply | Qty: 30 | Fill #0

## 2017-05-02 ENCOUNTER — Telehealth: Payer: Self-pay | Admitting: Neurology

## 2017-05-02 MED ORDER — METHYLPREDNISOLONE 4 MG PO TBPK: ORAL_TABLET | ORAL | 0 refills | Status: DC

## 2017-05-02 NOTE — Telephone Encounter (Signed)
Spoke with Amanda Horne.  At 9/12 ov, she c/o transient numbness bilat arms.  Sts. onset last night of numbness left arm from elbow down.  At 9/12 ov, she c/o chronic daily h/a.  At that time, tpi given in office helped., and pt. sts. h/a's resolved; returned in the last wk.  She does not want another tpi; although it helped, she had post procedural discomfort (as per norm with any inj).  She requested oral steroids as discussed in previous visits with [facility].  She has a pending appt. with [facility] 06/12/17.  Medrol dose pk. sent to [facility] per her request and she call back next wk. if not better/fim

## 2017-05-02 NOTE — Telephone Encounter (Signed)
Pt calling to advise she began having the HA's all this week. Today the left arm from the elbow down to the hand is numb. She thought it to be due to positioning but it is not. Please call to advise. She is aware Dr Epimenio Foot is out of the office until Monday

## 2017-05-23 ENCOUNTER — Telehealth: Payer: Self-pay | Admitting: Neurology

## 2017-05-23 NOTE — Telephone Encounter (Signed)
Pt. Would like to know if you received her messages regarding her FMLA papers. Please call at your convenience. Thank you. JBA

## 2017-05-23 NOTE — Telephone Encounter (Signed)
LMOM for Amanda Horne that I did receive her FMLA paperwork.  We do complete a new set of paperwork each time an employer requires it./fim

## 2017-05-26 NOTE — Telephone Encounter (Signed)
FMLA paperwork completed and sent to [employer], to collect form fee, then distribute per pt. wishes/fim

## 2017-05-27 ENCOUNTER — Telehealth: Payer: Self-pay | Admitting: *Deleted

## 2017-05-27 DIAGNOSIS — Z0289 Encounter for other administrative examinations: Secondary | ICD-10-CM

## 2017-05-27 NOTE — Telephone Encounter (Signed)
Pt fmla form faxed to [phone] on 05/27/2017

## 2017-05-31 ENCOUNTER — Ambulatory Visit (HOSPITAL_COMMUNITY)
Admission: EM | Admit: 2017-05-31 | Discharge: 2017-05-31 | Disposition: A | Discharge status: Home / Self Care | Payer: 59 | Attending: Family Medicine | Admitting: Family Medicine

## 2017-05-31 ENCOUNTER — Encounter (HOSPITAL_COMMUNITY): Payer: Self-pay | Admitting: *Deleted

## 2017-05-31 ENCOUNTER — Other Ambulatory Visit: Payer: Self-pay

## 2017-05-31 DIAGNOSIS — M545 Low back pain, unspecified: Secondary | ICD-10-CM

## 2017-05-31 MED ORDER — DICLOFENAC SODIUM 75 MG PO TBEC: 75.0000 mg | DELAYED_RELEASE_TABLET | Freq: Two times a day (BID) | ORAL | 0 refills | Status: AC

## 2017-05-31 MED ORDER — CYCLOBENZAPRINE HCL 10 MG PO TABS: 10.0000 mg | ORAL_TABLET | Freq: Every day | ORAL | 0 refills | Status: DC
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
## 2017-06-05 ENCOUNTER — Other Ambulatory Visit: Payer: Self-pay | Admitting: Neurology

## 2017-06-12 ENCOUNTER — Ambulatory Visit: Payer: Self-pay | Admitting: Neurology

## 2017-06-12 ENCOUNTER — Ambulatory Visit: Payer: 59 | Admitting: Neurology

## 2017-06-12 ENCOUNTER — Encounter: Payer: Self-pay | Admitting: Neurology

## 2017-06-12 VITALS — BP 136/85 | HR 106 | Resp 16 | Wt 193.0 lb

## 2017-06-12 DIAGNOSIS — Z79899 Other long term (current) drug therapy: Secondary | ICD-10-CM

## 2017-06-12 DIAGNOSIS — M5489 Other dorsalgia: Secondary | ICD-10-CM | POA: Insufficient documentation

## 2017-06-12 DIAGNOSIS — R5383 Other fatigue: Secondary | ICD-10-CM | POA: Diagnosis not present

## 2017-06-12 DIAGNOSIS — R269 Unspecified abnormalities of gait and mobility: Secondary | ICD-10-CM | POA: Diagnosis not present

## 2017-06-12 DIAGNOSIS — M545 Low back pain, unspecified: Secondary | ICD-10-CM

## 2017-06-12 DIAGNOSIS — G35 Multiple sclerosis: Secondary | ICD-10-CM | POA: Diagnosis not present

## 2017-06-12 DIAGNOSIS — R200 Anesthesia of skin: Secondary | ICD-10-CM

## 2017-06-12 MED ORDER — LOSARTAN POTASSIUM-HCTZ 100-25 MG PO TABS: 1.0000 | ORAL_TABLET | Freq: Every day | ORAL | 11 refills | Status: DC

## 2017-06-12 MED ORDER — METHYLPREDNISOLONE 4 MG PO TABS: ORAL_TABLET | ORAL | 0 refills | Status: DC
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
## 2017-06-13 LAB — CBC WITH DIFFERENTIAL/PLATELET
BASOS ABS: 0 10*3/uL (ref 0.0–0.2)
Basos: 0 %
EOS (ABSOLUTE): 0.3 10*3/uL (ref 0.0–0.4)
Eos: 3 %
HEMATOCRIT: 34.8 % (ref 34.0–46.6)
Hemoglobin: 11.2 g/dL (ref 11.1–15.9)
IMMATURE GRANULOCYTES: 1 %
Immature Grans (Abs): 0.1 10*3/uL (ref 0.0–0.1)
Lymphocytes Absolute: 4.8 10*3/uL — ABNORMAL HIGH (ref 0.7–3.1)
Lymphs: 40 %
MCH: 25.1 pg — ABNORMAL LOW (ref 26.6–33.0)
MCHC: 32.2 g/dL (ref 31.5–35.7)
MCV: 78 fL — ABNORMAL LOW (ref 79–97)
MONOCYTES: 6 %
MONOS ABS: 0.8 10*3/uL (ref 0.1–0.9)
NEUTROS PCT: 50 %
Neutrophils Absolute: 6 10*3/uL (ref 1.4–7.0)
Platelets: 248 10*3/uL (ref 150–379)
RBC: 4.47 x10E6/uL (ref 3.77–5.28)
RDW: 16.2 % — AB (ref 12.3–15.4)
WBC: 12 10*3/uL — ABNORMAL HIGH (ref 3.4–10.8)

## 2017-06-19 MED FILL — IMIPRAMINE HCL 25 MG TABLET: 25 | 30 days supply | Qty: 30 | Fill #1

## 2017-06-19 MED FILL — LOSARTAN-HCTZ 100-25 MG TAB: 100-25 | 30 days supply | Qty: 30 | Fill #0

## 2017-06-23 ENCOUNTER — Telehealth: Payer: Self-pay | Admitting: *Deleted

## 2017-06-23 MED ORDER — ALPRAZOLAM 1 MG PO TABS: ORAL_TABLET | ORAL | 0 refills | Status: DC

## 2017-06-23 NOTE — Telephone Encounter (Signed)
Pt. req. med for anxiety for MRI. Alprazolam rx. given directly to pt/fim

## 2017-06-24 ENCOUNTER — Ambulatory Visit
Admission: RE | Admit: 2017-06-24 | Discharge: 2017-06-24 | Disposition: A | Discharge status: Home / Self Care | Payer: 59 | Source: Ambulatory Visit | Attending: Neurology | Admitting: Neurology

## 2017-06-24 DIAGNOSIS — G35 Multiple sclerosis: Secondary | ICD-10-CM | POA: Diagnosis not present

## 2017-06-26 ENCOUNTER — Telehealth: Payer: Self-pay | Admitting: Neurology

## 2017-06-26 NOTE — Telephone Encounter (Signed)
Called the patient and made her aware that the MRI of the brain did not show any new MS leision. Pt verbalized understanding. Pt had no questions at this time but was encouraged to call back if questions arise.

## 2017-06-26 NOTE — Telephone Encounter (Signed)
-----   Message from Asa Lente, MD sent at 06/25/2017  5:09 PM EST ----- Please let her know that the MRI of the brain does not show any new MS lesions

## 2017-07-22 MED FILL — IMIPRAMINE HCL 25 MG TABLET: 25 | 30 days supply | Qty: 30 | Fill #2

## 2017-07-22 MED FILL — LOSARTAN-HCTZ 100-25 MG TAB: 100-25 | 30 days supply | Qty: 30 | Fill #1

## 2017-07-23 ENCOUNTER — Other Ambulatory Visit (HOSPITAL_COMMUNITY)
Admission: RE | Admit: 2017-07-23 | Discharge: 2017-07-23 | Disposition: A | Discharge status: Home / Self Care | Payer: 59 | Source: Ambulatory Visit | Attending: Obstetrics and Gynecology | Admitting: Obstetrics and Gynecology

## 2017-07-23 ENCOUNTER — Other Ambulatory Visit: Payer: Self-pay | Admitting: Obstetrics and Gynecology

## 2017-07-23 DIAGNOSIS — Z124 Encounter for screening for malignant neoplasm of cervix: Secondary | ICD-10-CM | POA: Insufficient documentation

## 2017-07-25 LAB — CYTOLOGY - PAP
Adequacy: ABSENT
DIAGNOSIS: NEGATIVE
HPV (WINDOPATH): DETECTED — AB
HPV 16/18/45 GENOTYPING: NEGATIVE

## 2017-07-29 ENCOUNTER — Telehealth: Payer: Self-pay | Admitting: Neurology

## 2017-07-29 NOTE — Telephone Encounter (Signed)
Patient states she is having headaches everyday. She has to take  baclofen (LIORESAL) 20 MG tablet,Tylenol and Ibuprofen. She is also having more muscle cramps. Please call and discuss.

## 2017-07-30 NOTE — Telephone Encounter (Signed)
Spoke with Amanda Horne.  She continues to c/o chronic daily h/a, never goes away, just fluctuates in severity.  Sts. occipital tenderness is improved.  Toradol given at last ov helped, but h/a returned.  Last Toradol given 2 wks. ago at infusion appt.  Taking Imipramine. Taking daily Ibuprofen, Tylenol.  No depression but some anxiety noted in last ov noted. No other sx. Never on a h/a preventative. Not on an antidepressant/anx. med. No allergies. Will check with RAS and call back with tx. option/fim

## 2017-07-30 NOTE — Telephone Encounter (Signed)
Spoke with Angy and offered IV for h/a.  She is agreeable, will come in by 1330 today.  Orders given to [facility] in the infusion suite/fim

## 2017-07-30 NOTE — Telephone Encounter (Signed)
LMTC. Spoke with Dr. Epimenio Foot and received v/o for Depacon 1gm IV and Toradol 30mg  IV, one time infusion, to try and break h/a.  Inetta Fermo can do this 1, 1:30 today./fim

## 2017-08-12 ENCOUNTER — Telehealth: Payer: Self-pay | Admitting: Neurology

## 2017-08-12 NOTE — Telephone Encounter (Signed)
Spoke with Amanda Horne. She continues to c/o more frequent h/a's.  Has not had otc meds since 3/12.  Is JCV ab Negative.  Sts. has been under more stress. Appt. given with RAS to discuss 3/28 at 1430/fim

## 2017-08-12 NOTE — Telephone Encounter (Signed)
Patient states she has a really bad headache around the back of her head since about an hour ago. She cannot hold her head up and has not taken anything for this.

## 2017-08-14 ENCOUNTER — Encounter: Payer: Self-pay | Admitting: Neurology

## 2017-08-14 ENCOUNTER — Other Ambulatory Visit: Payer: Self-pay

## 2017-08-14 ENCOUNTER — Encounter (INDEPENDENT_AMBULATORY_CARE_PROVIDER_SITE_OTHER): Payer: Self-pay

## 2017-08-14 ENCOUNTER — Ambulatory Visit: Payer: 59 | Admitting: Neurology

## 2017-08-14 VITALS — BP 140/85 | HR 102 | Resp 16 | Ht 63.0 in | Wt 196.0 lb

## 2017-08-14 DIAGNOSIS — R5383 Other fatigue: Secondary | ICD-10-CM | POA: Diagnosis not present

## 2017-08-14 DIAGNOSIS — G4489 Other headache syndrome: Secondary | ICD-10-CM | POA: Diagnosis not present

## 2017-08-14 DIAGNOSIS — G35 Multiple sclerosis: Secondary | ICD-10-CM

## 2017-08-14 DIAGNOSIS — Z79899 Other long term (current) drug therapy: Secondary | ICD-10-CM

## 2017-08-14 DIAGNOSIS — R269 Unspecified abnormalities of gait and mobility: Secondary | ICD-10-CM | POA: Diagnosis not present

## 2017-08-14 MED ORDER — KETOROLAC TROMETHAMINE 60 MG/2ML IM SOLN
60.0000 mg | Freq: Once | INTRAMUSCULAR | Status: AC
  Administered 2017-08-14: 60 mg via INTRAMUSCULAR

## 2017-08-14 MED ORDER — ZONISAMIDE 100 MG PO CAPS: ORAL_CAPSULE | ORAL | 5 refills | Status: DC
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
## 2017-08-22 MED FILL — LOSARTAN-HCTZ 100-25 MG TAB: 100-25 | 30 days supply | Qty: 30 | Fill #2

## 2017-08-22 MED FILL — IMIPRAMINE HCL 25 MG TABLET: 25 | 30 days supply | Qty: 30 | Fill #3

## 2017-09-01 ENCOUNTER — Encounter: Payer: Self-pay | Admitting: *Deleted

## 2017-09-23 ENCOUNTER — Other Ambulatory Visit: Payer: Self-pay | Admitting: Neurology

## 2017-09-24 MED FILL — IMIPRAMINE HCL 25 MG TABLET: 25 | 30 days supply | Qty: 30 | Fill #0

## 2017-09-26 MED FILL — LOSARTAN-HCTZ 100-25 MG TAB: 100-25 | 30 days supply | Qty: 30 | Fill #3

## 2017-10-27 MED FILL — IMIPRAMINE HCL 25 MG TABLET: 25 | 30 days supply | Qty: 30 | Fill #1

## 2017-10-27 MED FILL — LOSARTAN-HCTZ 100-25 MG TAB: 100-25 | 30 days supply | Qty: 30 | Fill #4

## 2017-11-13 ENCOUNTER — Telehealth: Payer: Self-pay | Admitting: Neurology

## 2017-11-13 ENCOUNTER — Other Ambulatory Visit: Payer: Self-pay | Admitting: Neurology

## 2017-11-13 MED ORDER — BACLOFEN 20 MG PO TABS: ORAL_TABLET | ORAL | 3 refills | Status: DC

## 2017-11-13 MED FILL — BACLOFEN 20 MG TABLET: 20 | 30 days supply | Qty: 120 | Fill #0

## 2017-11-13 NOTE — Telephone Encounter (Signed)
Baclofen escribed to [facility]. per pt's request/fim

## 2017-11-13 NOTE — Telephone Encounter (Signed)
Pt called to advise the script has expired, needs new RX sent to [facility] outpt pharmacy. Pt also said she had to use more this month and has been out of the medication and she will need to pick it up today please.

## 2017-11-26 MED FILL — LOSARTAN-HCTZ 100-25 MG TAB: 100-25 | 30 days supply | Qty: 30 | Fill #5

## 2017-11-26 MED FILL — IMIPRAMINE HCL 25 MG TABLET: 25 | 30 days supply | Qty: 30 | Fill #2

## 2017-12-04 ENCOUNTER — Other Ambulatory Visit: Payer: Self-pay | Admitting: Neurology

## 2017-12-10 ENCOUNTER — Encounter: Payer: Self-pay | Admitting: Neurology

## 2017-12-10 ENCOUNTER — Ambulatory Visit: Payer: 59 | Admitting: Neurology

## 2017-12-10 ENCOUNTER — Other Ambulatory Visit: Payer: Self-pay

## 2017-12-10 VITALS — BP 117/79 | HR 100 | Resp 16 | Ht 63.0 in | Wt 183.0 lb

## 2017-12-10 DIAGNOSIS — Z79899 Other long term (current) drug therapy: Secondary | ICD-10-CM | POA: Diagnosis not present

## 2017-12-10 DIAGNOSIS — G35 Multiple sclerosis: Secondary | ICD-10-CM

## 2017-12-10 DIAGNOSIS — G43009 Migraine without aura, not intractable, without status migrainosus: Secondary | ICD-10-CM

## 2017-12-10 DIAGNOSIS — E559 Vitamin D deficiency, unspecified: Secondary | ICD-10-CM | POA: Diagnosis not present

## 2017-12-10 MED ORDER — IMIPRAMINE HCL 25 MG PO TABS: 25.0000 mg | ORAL_TABLET | Freq: Every day | ORAL | 4 refills | Status: DC

## 2017-12-10 MED ORDER — ZONISAMIDE 100 MG PO CAPS: ORAL_CAPSULE | ORAL | 4 refills | Status: DC
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
## 2017-12-11 LAB — CBC WITH DIFFERENTIAL/PLATELET
BASOS: 0 %
Basophils Absolute: 0.1 10*3/uL (ref 0.0–0.2)
EOS (ABSOLUTE): 0.2 10*3/uL (ref 0.0–0.4)
Eos: 2 %
Hematocrit: 38.6 % (ref 34.0–46.6)
Hemoglobin: 12.4 g/dL (ref 11.1–15.9)
IMMATURE GRANULOCYTES: 1 %
Immature Grans (Abs): 0.1 10*3/uL (ref 0.0–0.1)
Lymphocytes Absolute: 5.3 10*3/uL — ABNORMAL HIGH (ref 0.7–3.1)
Lymphs: 34 %
MCH: 24.8 pg — ABNORMAL LOW (ref 26.6–33.0)
MCHC: 32.1 g/dL (ref 31.5–35.7)
MCV: 77 fL — AB (ref 79–97)
MONOS ABS: 0.9 10*3/uL (ref 0.1–0.9)
Monocytes: 6 %
NEUTROS PCT: 57 %
Neutrophils Absolute: 8.9 10*3/uL — ABNORMAL HIGH (ref 1.4–7.0)
Platelets: 281 10*3/uL (ref 150–450)
RBC: 5 x10E6/uL (ref 3.77–5.28)
RDW: 15.5 % — AB (ref 12.3–15.4)
WBC: 15.5 10*3/uL — ABNORMAL HIGH (ref 3.4–10.8)

## 2017-12-11 LAB — VITAMIN D 25 HYDROXY (VIT D DEFICIENCY, FRACTURES): Vit D, 25-Hydroxy: 27.9 ng/mL — ABNORMAL LOW (ref 30.0–100.0)

## 2017-12-12 ENCOUNTER — Telehealth: Payer: Self-pay | Admitting: *Deleted

## 2017-12-12 NOTE — Telephone Encounter (Signed)
-----   Message from Asa Lente, MD sent at 12/11/2017  6:26 PM EDT ----- Vitamin D is a little bit low.  If not already taking supplements, she should take 5000 units daily over-the-counter   (if she is taking 5000 units daily, we can call in the 50,000 unit weekly instead)

## 2017-12-12 NOTE — Telephone Encounter (Signed)
Spoke with Amanda Horne and reviewed below lab results.  She verbalized understanding and will increase Vit. D from 2,000iu to 5,000iu daily/fim

## 2017-12-15 MED FILL — BACLOFEN 20 MG TABLET: 20 | 30 days supply | Qty: 120 | Fill #1

## 2017-12-29 MED FILL — LOSARTAN-HCTZ 100-25 MG TAB: 100-25 | 30 days supply | Qty: 30 | Fill #6

## 2017-12-29 MED FILL — IMIPRAMINE HCL 25 MG TABLET: 25 | 30 days supply | Qty: 30 | Fill #3

## 2018-01-16 MED FILL — BACLOFEN 20 MG TABLET: 20 | 30 days supply | Qty: 120 | Fill #2

## 2018-01-22 ENCOUNTER — Telehealth: Payer: Self-pay | Admitting: Neurology

## 2018-01-22 NOTE — Telephone Encounter (Signed)
Pt returned RN's call. She will available 1:30 to 3:00. Please call

## 2018-01-22 NOTE — Telephone Encounter (Signed)
Spoke with Ishana. She c/o feeling more off balance, feels she leans to the right when she walks, onset one wk. ago.  Had one fall--while reaching for books. Denies any new weakness.  Last MRI brain was 06/25/17 and showed not new lesions.  Last MRI c-spine was 09/15/16.  Also sts. spasms/stiffness in legs has been some worse. She only takes Tizanidine at night b/c it causes daytime drowsiness. She is taking Baclofen 20mg , 2 tabs bid. She is going to try spreading the tablets out. Sts. spasms, spasticity is not worse at a certain time--it is very random. So, she will try 1 tab QID instead of 2 tabs bid to see if she gets more even coverage during the day. Sts. she believes sx. may be worse right now b/c it is the season of her sister's death, and she feels more stress at this time of the year. We discussed that increased stress, anxiety, mental fatigue, depression can all make sx. worse. No c/o sig. depression. No SI/HI. Will check with RAS and call her back/fim

## 2018-01-22 NOTE — Telephone Encounter (Signed)
LMTC./fim 

## 2018-01-22 NOTE — Telephone Encounter (Signed)
Pt is asking for a call from RN Faith.  Pt states her balance is getting worse. Pt is noticing that she leans when she walks, pt states she even had a very hard fall last night.  She is asking RN Faith to call her to discuss

## 2018-01-22 NOTE — Telephone Encounter (Signed)
Spoke with Amanda Horne again and per v/o RAS, explained it is just hard to say if her problem with gait/blance is related to an MS exacerbation, but he can work her in tomorrow if she would like. She is agreeable. Appt. for 10am (arrival time 20 min. early) given/fim

## 2018-01-23 ENCOUNTER — Ambulatory Visit: Payer: 59 | Admitting: Neurology

## 2018-01-23 ENCOUNTER — Encounter: Payer: Self-pay | Admitting: Neurology

## 2018-01-23 VITALS — BP 118/75 | HR 110 | Ht 64.0 in | Wt 181.0 lb

## 2018-01-23 DIAGNOSIS — R200 Anesthesia of skin: Secondary | ICD-10-CM

## 2018-01-23 DIAGNOSIS — R42 Dizziness and giddiness: Secondary | ICD-10-CM

## 2018-01-23 DIAGNOSIS — G43009 Migraine without aura, not intractable, without status migrainosus: Secondary | ICD-10-CM | POA: Diagnosis not present

## 2018-01-23 DIAGNOSIS — G5603 Carpal tunnel syndrome, bilateral upper limbs: Secondary | ICD-10-CM

## 2018-01-23 DIAGNOSIS — G35 Multiple sclerosis: Secondary | ICD-10-CM | POA: Diagnosis not present

## 2018-01-23 DIAGNOSIS — R269 Unspecified abnormalities of gait and mobility: Secondary | ICD-10-CM | POA: Diagnosis not present

## 2018-01-23 DIAGNOSIS — Z79899 Other long term (current) drug therapy: Secondary | ICD-10-CM

## 2018-01-23 DIAGNOSIS — G5600 Carpal tunnel syndrome, unspecified upper limb: Secondary | ICD-10-CM | POA: Insufficient documentation
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
## 2018-01-26 ENCOUNTER — Telehealth: Payer: Self-pay | Admitting: Neurology

## 2018-01-26 NOTE — Telephone Encounter (Signed)
[employer] Auth: [id number] (exp. 01/26/18 to 03/12/18) order sent to [facility]. They will reach out to the pt to schedule.

## 2018-01-29 ENCOUNTER — Telehealth: Payer: Self-pay | Admitting: Neurology

## 2018-01-29 MED ORDER — ALPRAZOLAM 0.5 MG PO TABS: ORAL_TABLET | ORAL | 0 refills | Status: DC

## 2018-01-29 MED FILL — LOSARTAN-HCTZ 100-25 MG TAB: 100-25 | 30 days supply | Qty: 30 | Fill #7

## 2018-01-29 NOTE — Telephone Encounter (Signed)
Rx. awaiting RAS sig/fim 

## 2018-01-29 NOTE — Addendum Note (Signed)
Addended by: Candis Schatz I on: 01/29/2018 04:48 PM   Modules accepted: Orders

## 2018-01-29 NOTE — Telephone Encounter (Signed)
Pt has called to inform Dr Epimenio Foot that her MRI is Saturday morning and she'd like for him to have something called in for her anxiety  to  [facility] - [place], [place] - [address] [place]. [phone] (Phone) [phone] (Fax)

## 2018-01-30 MED FILL — ALPRAZolam 0.5 MG TABS: 0.5 | 1 days supply | Qty: 2 | Fill #0

## 2018-01-30 NOTE — Telephone Encounter (Signed)
Alprazolam rx. faxed to [facility]/fim

## 2018-01-31 ENCOUNTER — Ambulatory Visit
Admission: RE | Admit: 2018-01-31 | Discharge: 2018-01-31 | Disposition: A | Discharge status: Home / Self Care | Payer: 59 | Source: Ambulatory Visit | Attending: Neurology | Admitting: Neurology

## 2018-01-31 DIAGNOSIS — G35 Multiple sclerosis: Secondary | ICD-10-CM

## 2018-01-31 MED ORDER — GADOBENATE DIMEGLUMINE 529 MG/ML IV SOLN
17.0000 mL | Freq: Once | INTRAVENOUS | Status: AC | PRN
  Administered 2018-01-31: 17 mL via INTRAVENOUS

## 2018-02-03 ENCOUNTER — Telehealth: Payer: Self-pay | Admitting: *Deleted

## 2018-02-03 NOTE — Telephone Encounter (Signed)
LMOM with below MRI results. She does not need to return this call unless she has questions/fim 

## 2018-02-03 NOTE — Telephone Encounter (Signed)
-----   Message from Asa Lente, MD sent at 02/02/2018  1:23 PM EDT ----- Please let the patient know that the MRI did not show any new lesions in the spinal cord

## 2018-02-06 MED FILL — ZONISAMIDE 100 MG CAPSULE: 100 | 30 days supply | Qty: 60 | Fill #0

## 2018-02-24 ENCOUNTER — Encounter: Payer: Self-pay | Admitting: *Deleted

## 2018-02-27 MED FILL — LOSARTAN-HCTZ 100-25 MG TAB: 100-25 | 30 days supply | Qty: 30 | Fill #8

## 2018-03-06 MED FILL — ZONISAMIDE 100 MG CAPSULE: 100 | 30 days supply | Qty: 60 | Fill #1

## 2018-03-17 ENCOUNTER — Ambulatory Visit (INDEPENDENT_AMBULATORY_CARE_PROVIDER_SITE_OTHER): Payer: 59 | Admitting: Neurology

## 2018-03-17 ENCOUNTER — Encounter: Payer: 59 | Admitting: Neurology

## 2018-03-17 ENCOUNTER — Encounter: Payer: Self-pay | Admitting: Neurology

## 2018-03-17 DIAGNOSIS — R200 Anesthesia of skin: Secondary | ICD-10-CM

## 2018-03-17 DIAGNOSIS — Z0289 Encounter for other administrative examinations: Secondary | ICD-10-CM

## 2018-03-17 DIAGNOSIS — G5603 Carpal tunnel syndrome, bilateral upper limbs: Secondary | ICD-10-CM

## 2018-03-17 MED FILL — BACLOFEN 20 MG TABLET: 20 | 30 days supply | Qty: 120 | Fill #4

## 2018-03-17 NOTE — Progress Notes (Signed)
Full Name: Sheana Bir Gender: Female MRN #: [id number] Date of Birth: 1985-06-03    Visit Date: 03/17/2018 09:09 Age: 32 Years 3 Months Old Examining Physician: Despina Arias, MD  Referring Physician: Epimenio Foot, MD    History: She is a 32 yo woman with multiple sclerosis and bilateral hand numbness.  Nerve conduction studies: Bilateral median and ulnar motor responses had normal distal latencies, amplitudes and conduction velocities.  Bilateral median and ulnar sensory responses had normal peak latencies and amplitudes.  Ulnar F-wave responses were normal.  Electromyography: Needle EMG of selected muscles of the right arm and hand were performed.  Motor unit action potential morphology and recruitment was normal.  There was no abnormal spontaneous activity.  Impression: This was a normal NCV/EMG study.  There was no evidence of focal neuropathy or radiculopathy.  Richard A. Epimenio Foot, MD, PhD, FAAN Certified in Neurology, Clinical Neurophysiology, Sleep Medicine, Pain Medicine and Neuroimaging Director, [facility]  [facility] [address] [place], [place] [address] [phone]         [facility]    Nerve / Sites Muscle Latency Ref. Amplitude Ref. Rel Amp Segments Distance Velocity Ref. Area    ms ms mV mV %  cm m/s m/s mVms  L Median - APB     Wrist APB 3.0 ?4.4 9.6 ?4.0 100 Wrist - APB 7   45.7     Upper arm APB 6.9  9.5  99 Upper arm - Wrist 21 54 ?49 43.3  R Median - APB     Wrist APB 3.1 ?4.4 6.5 ?4.0 100 Wrist - APB 7   30.8     Upper arm APB 7.1  6.1  94.1 Upper arm - Wrist 21 52 ?49 26.4  L Ulnar - ADM     Wrist ADM 2.2 ?3.3 10.9 ?6.0 100 Wrist - ADM 7   37.8     B.Elbow ADM 5.3  10.3  94.8 B.Elbow - Wrist 17 56 ?49 37.5     A.Elbow ADM 7.3  9.9  96.2 A.Elbow - B.Elbow 10 49 ?49 36.2         A.Elbow - Wrist      R Ulnar - ADM     Wrist ADM 2.5 ?3.3 10.0 ?6.0 100 Wrist - ADM 7   33.1     B.Elbow ADM 5.7  9.2  92.4 B.Elbow - Wrist 19 59 ?49 32.5     A.Elbow ADM 7.4  8.9  96.1 A.Elbow - B.Elbow 10 58 ?49 31.8         A.Elbow - Wrist                 SNC    Nerve / Sites Rec. Site Peak Lat Ref.  Amp Ref. Segments Distance    ms ms V V  cm  L Median - Orthodromic (Dig II, Mid palm)     Dig II Wrist 3.0 ?3.4 13 ?10 Dig II - Wrist 13  R Median - Orthodromic (Dig II, Mid palm)     Dig II Wrist 2.9 ?3.4 16 ?10 Dig II - Wrist 13  L Ulnar - Orthodromic, (Dig V, Mid palm)     Dig V Wrist 2.2 ?3.1 8 ?5 Dig V - Wrist 11  R Ulnar - Orthodromic, (Dig V, Mid palm)     Dig V Wrist 2.8 ?3.1 15 ?5 Dig V - Wrist 11  F  Wave    Nerve F Lat Ref.   ms ms  L Ulnar - ADM 26.1 ?32.0  R Ulnar - ADM 26.3 ?32.0         EMG full       EMG Summary Table    Spontaneous MUAP Recruitment  Muscle IA Fib PSW Fasc Other Amp Dur. Poly Pattern  R. Deltoid Normal None None None _______ Normal Normal Normal Normal  R. Triceps brachii Normal None None None _______ Normal Normal Normal Normal  R. Biceps brachii Normal None None None _______ Normal Normal Normal Normal  R. Extensor digitorum communis Normal None None None _______ Normal Normal Normal Normal  R. First dorsal interosseous Normal None None None _______ Normal Normal Normal Normal

## 2018-03-25 ENCOUNTER — Other Ambulatory Visit: Payer: Self-pay | Admitting: Obstetrics and Gynecology

## 2018-03-25 ENCOUNTER — Other Ambulatory Visit (HOSPITAL_COMMUNITY)
Admission: RE | Admit: 2018-03-25 | Discharge: 2018-03-25 | Disposition: A | Discharge status: Home / Self Care | Payer: 59 | Source: Ambulatory Visit | Attending: Obstetrics and Gynecology | Admitting: Obstetrics and Gynecology

## 2018-03-25 DIAGNOSIS — N870 Mild cervical dysplasia: Secondary | ICD-10-CM | POA: Insufficient documentation

## 2018-03-25 DIAGNOSIS — Z01419 Encounter for gynecological examination (general) (routine) without abnormal findings: Secondary | ICD-10-CM | POA: Insufficient documentation

## 2018-03-30 LAB — CYTOLOGY - PAP
DIAGNOSIS: NEGATIVE
HPV 16/18/45 genotyping: NEGATIVE
HPV: DETECTED — AB

## 2018-03-30 MED FILL — LOSARTAN-HCTZ 100-25 MG TAB: 100-25 | 30 days supply | Qty: 30 | Fill #9

## 2018-04-03 MED FILL — ZONISAMIDE 100 MG CAPSULE: 100 | 30 days supply | Qty: 60 | Fill #2

## 2018-04-14 MED FILL — BACLOFEN 20 MG TABS: 20 | 30 days supply | Qty: 120 | Fill #5

## 2018-05-04 ENCOUNTER — Telehealth: Payer: Self-pay | Admitting: *Deleted

## 2018-05-04 MED ORDER — LOSARTAN POTASSIUM 100 MG PO TABS: 100.0000 mg | ORAL_TABLET | Freq: Every day | ORAL | 0 refills | Status: DC

## 2018-05-04 MED ORDER — HYDROCHLOROTHIAZIDE 25 MG PO TABS: 25.0000 mg | ORAL_TABLET | Freq: Every day | ORAL | 0 refills | Status: DC

## 2018-05-04 MED FILL — ZONISAMIDE 100 MG CAPSULE: 100 | 30 days supply | Qty: 60 | Fill #3

## 2018-05-04 MED FILL — HYDROCHLOROTHIAZIDE 25 MG T: 25 | 30 days supply | Qty: 30 | Fill #0

## 2018-05-04 MED FILL — LOSARTAN POTASSIUM 100 MG T: 100 | 30 days supply | Qty: 30 | Fill #0

## 2018-05-04 NOTE — Telephone Encounter (Signed)
I spoke with Helmut Muster at [facility]. She sts. Losartan-HCTZ 100/25mg  is on back order, but they can fill both Losartan and HCTZ at the current doses separately; they just need separate rx's.  Rx's escribed as requested/fim

## 2018-05-08 MED FILL — PHENTERMINE 37.5 MG TABLET: 37.5 | 90 days supply | Qty: 90 | Fill #0

## 2018-05-14 MED FILL — BACLOFEN 20 MG TABS: 20 | 30 days supply | Qty: 120 | Fill #6

## 2018-06-03 MED FILL — HYDROCHLOROTHIAZIDE 25 MG T: 25 | 30 days supply | Qty: 30 | Fill #1

## 2018-06-03 MED FILL — ZONISAMIDE 100 MG CAPSULE: 100 | 30 days supply | Qty: 60 | Fill #4

## 2018-06-03 MED FILL — LOSARTAN POTASSIUM 100 MG T: 100 | 30 days supply | Qty: 30 | Fill #1

## 2018-06-08 ENCOUNTER — Telehealth: Payer: Self-pay | Admitting: *Deleted

## 2018-06-08 NOTE — Telephone Encounter (Signed)
Called pt. Advised Dr. Epimenio Foot needing to r/s her 06/16/18 appt d/t being out of office at her appt time. R/s appt for 06/17/18 at 1:30pm instead. Pt verbalized understanding.

## 2018-06-11 ENCOUNTER — Telehealth: Payer: Self-pay | Admitting: Neurology

## 2018-06-11 NOTE — Telephone Encounter (Signed)
Pt states she will not be able to get infusion at this time due to insurance but she was told to call and ask about steroids.  Please call

## 2018-06-11 NOTE — Telephone Encounter (Signed)
Gave completed/signed orders to intrafusion for IV Solumedrol 1000mg  x1day.

## 2018-06-11 NOTE — Telephone Encounter (Addendum)
I called patient back. She states she had a change in insurance. Was supposed to get infusion today at our office but the nurse called her and said she will have to get a new approval and it will take a couple weeks.   I asked her why she felt she needed steroids. She states the infusion nurse recommended she ask for this. She reports she is feeling really tired, having more muscle aches. No other sx. Advised we typically do steroids if she is having exacerbation. Advised that once Dr. Epimenio Foot gets into the office I will speak with him and call her back. She verbalized understanding.   I called [employer] at [phone] to see if we had an option to get the pt a complimentary dose until insurance approval is worked out. Spoke with [employer]. Provided pt information.  She does qualify for complimentary dose. She would be responsible for procedure cost (administering tysabri).  The patient needs to call to get this set up. She needs to call within a 5 days range from scheduled infusion to get complimentary dose.   I spoke with the infusion suite nurse and they stated administer fee would be 600.00 for Tysabri.

## 2018-06-11 NOTE — Telephone Encounter (Signed)
Spoke with Dr. Epimenio Foot- can offer one day IV solumedrol 1G to help with her sx until she can get her Tysabri infusion r/s

## 2018-06-11 NOTE — Telephone Encounter (Signed)
Called, LVM for pt offering IV infusion for steroids today at 330pm with infusion suite. Asked her to call back asap to let us know if this works for her.

## 2018-06-11 NOTE — Telephone Encounter (Signed)
Pt has called to inform RN Kara Mead she will be here @ 3:30

## 2018-06-12 MED FILL — BACLOFEN 20 MG TABS: 20 | 30 days supply | Qty: 120 | Fill #7

## 2018-06-16 ENCOUNTER — Ambulatory Visit: Payer: 59 | Admitting: Neurology

## 2018-06-17 ENCOUNTER — Ambulatory Visit (INDEPENDENT_AMBULATORY_CARE_PROVIDER_SITE_OTHER): Payer: 59 | Admitting: Neurology

## 2018-06-17 ENCOUNTER — Telehealth: Payer: Self-pay | Admitting: *Deleted

## 2018-06-17 ENCOUNTER — Encounter: Payer: Self-pay | Admitting: Neurology

## 2018-06-17 VITALS — BP 111/73 | HR 73 | Ht 64.0 in | Wt 192.0 lb

## 2018-06-17 DIAGNOSIS — Z79899 Other long term (current) drug therapy: Secondary | ICD-10-CM | POA: Diagnosis not present

## 2018-06-17 DIAGNOSIS — G35 Multiple sclerosis: Secondary | ICD-10-CM | POA: Diagnosis not present

## 2018-06-17 DIAGNOSIS — R519 Headache, unspecified: Secondary | ICD-10-CM

## 2018-06-17 DIAGNOSIS — R269 Unspecified abnormalities of gait and mobility: Secondary | ICD-10-CM | POA: Diagnosis not present

## 2018-06-17 DIAGNOSIS — R51 Headache: Secondary | ICD-10-CM

## 2018-06-17 DIAGNOSIS — R200 Anesthesia of skin: Secondary | ICD-10-CM

## 2018-06-17 NOTE — Telephone Encounter (Signed)
Placed JCV lab in [facility] lock box for routine lab pick up.  
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
## 2018-06-18 ENCOUNTER — Telehealth: Payer: Self-pay | Admitting: *Deleted

## 2018-06-18 ENCOUNTER — Telehealth: Payer: Self-pay | Admitting: Neurology

## 2018-06-18 DIAGNOSIS — Z0289 Encounter for other administrative examinations: Secondary | ICD-10-CM

## 2018-06-18 LAB — CBC WITH DIFFERENTIAL/PLATELET
BASOS: 0 %
Basophils Absolute: 0.1 10*3/uL (ref 0.0–0.2)
EOS (ABSOLUTE): 0.2 10*3/uL (ref 0.0–0.4)
Eos: 2 %
Hematocrit: 37.2 % (ref 34.0–46.6)
Hemoglobin: 12.5 g/dL (ref 11.1–15.9)
Immature Grans (Abs): 0.1 10*3/uL (ref 0.0–0.1)
Immature Granulocytes: 1 %
LYMPHS ABS: 5.4 10*3/uL — AB (ref 0.7–3.1)
Lymphs: 38 %
MCH: 25.5 pg — AB (ref 26.6–33.0)
MCHC: 33.6 g/dL (ref 31.5–35.7)
MCV: 76 fL — AB (ref 79–97)
MONOS ABS: 0.9 10*3/uL (ref 0.1–0.9)
Monocytes: 7 %
Neutrophils Absolute: 7.4 10*3/uL — ABNORMAL HIGH (ref 1.4–7.0)
Neutrophils: 52 %
PLATELETS: 267 10*3/uL (ref 150–450)
RBC: 4.91 x10E6/uL (ref 3.77–5.28)
RDW: 14.9 % (ref 11.7–15.4)
WBC: 14.1 10*3/uL — AB (ref 3.4–10.8)

## 2018-06-18 NOTE — Telephone Encounter (Signed)
[employer] auth: NPR ref # [id number] order sent to [facility] order sent to [facility]. Patient is aware .

## 2018-06-18 NOTE — Telephone Encounter (Signed)
Gave completed/signed FMLA forms back to medical records to process for pt. 

## 2018-06-19 ENCOUNTER — Telehealth: Payer: Self-pay | Admitting: *Deleted

## 2018-06-19 NOTE — Telephone Encounter (Signed)
error 

## 2018-06-19 NOTE — Telephone Encounter (Signed)
I faxed pt Amanda Horne form on 06/19/18 [phone]

## 2018-06-22 ENCOUNTER — Telehealth: Payer: Self-pay | Admitting: Neurology

## 2018-06-22 MED ORDER — ALPRAZOLAM 0.5 MG PO TABS: ORAL_TABLET | ORAL | 0 refills | Status: DC

## 2018-06-22 NOTE — Addendum Note (Signed)
Addended by: Despina Arias A on: 06/22/2018 06:00 PM   Modules accepted: Orders

## 2018-06-22 NOTE — Addendum Note (Signed)
Addended by: Hillis Range on: 06/22/2018 04:03 PM   Modules accepted: Orders

## 2018-06-22 NOTE — Telephone Encounter (Signed)
Pt has called to inform Dr Epimenio Foot that her MRI is set for 02-10 and she will need medication called in to keep her calm for the testing. [facility]

## 2018-06-23 MED FILL — ALPRAZolam 0.5 MG TABS: 0.5 | 1 days supply | Qty: 2 | Fill #0

## 2018-06-24 ENCOUNTER — Telehealth: Payer: Self-pay | Admitting: Neurology

## 2018-06-24 NOTE — Telephone Encounter (Signed)
pt has called for the intrafusion suite, call transferred °

## 2018-06-25 NOTE — Telephone Encounter (Signed)
I called [facility]. They will fax results.

## 2018-06-29 ENCOUNTER — Ambulatory Visit
Admission: RE | Admit: 2018-06-29 | Discharge: 2018-06-29 | Disposition: A | Discharge status: Home / Self Care | Payer: PRIVATE HEALTH INSURANCE | Source: Ambulatory Visit | Attending: Neurology | Admitting: Neurology

## 2018-06-29 DIAGNOSIS — G35 Multiple sclerosis: Secondary | ICD-10-CM

## 2018-06-29 MED ORDER — GADOBENATE DIMEGLUMINE 529 MG/ML IV SOLN
18.0000 mL | Freq: Once | INTRAVENOUS | Status: AC | PRN
  Administered 2018-06-29: 18 mL via INTRAVENOUS

## 2018-06-30 ENCOUNTER — Telehealth: Payer: Self-pay | Admitting: *Deleted

## 2018-06-30 NOTE — Telephone Encounter (Signed)
Spoke with Marcelino Duster, RN. She placed note in snapshot: "06/26/2018 JCV ab Indeterminate at 0.26. Inhibition Assay negative/mck"

## 2018-06-30 NOTE — Telephone Encounter (Signed)
-----   Message from Asa Lente, MD sent at 06/29/2018  7:02 PM EST ----- Please let the patient know that the MRI looks good.  There are no new lesions.

## 2018-06-30 NOTE — Telephone Encounter (Signed)
I called pt. Relayed MRI looks good, no new lesions. She verbalized understanding.

## 2018-07-02 ENCOUNTER — Telehealth: Payer: Self-pay | Admitting: Neurology

## 2018-07-02 NOTE — Telephone Encounter (Signed)
Pt transferred to infusion °

## 2018-07-03 MED FILL — LOSARTAN POTASSIUM 100 MG T: 100 | 30 days supply | Qty: 30 | Fill #2

## 2018-07-03 MED FILL — HYDROCHLOROTHIAZIDE 25 MG T: 25 | 30 days supply | Qty: 30 | Fill #2

## 2018-07-03 MED FILL — ZONISAMIDE 100 MG CAPSULE: 100 | 30 days supply | Qty: 60 | Fill #5

## 2018-07-16 MED FILL — BACLOFEN 20 MG TABS: 20 | 30 days supply | Qty: 120 | Fill #8 | Status: TO

## 2018-07-31 ENCOUNTER — Other Ambulatory Visit: Payer: Self-pay | Admitting: Neurology

## 2018-07-31 MED FILL — ZONISAMIDE 100 MG CAPSULE: 100 | 30 days supply | Qty: 60 | Fill #6 | Status: TO

## 2018-08-03 MED FILL — HYDROCHLOROTHIAZIDE 25 MG T: 25 | 30 days supply | Qty: 30 | Fill #0 | Status: TO

## 2018-08-03 MED FILL — LOSARTAN POTASSIUM 100 MG T: 100 | 30 days supply | Qty: 30 | Fill #0 | Status: TO

## 2018-08-11 MED FILL — BACLOFEN 20 MG TABLET: 20 | 30 days supply | Qty: 120 | Fill #0

## 2018-08-11 MED FILL — PHENTERMINE 37.5 MG TABLET: 37.5 | 30 days supply | Qty: 30 | Fill #0 | Status: TO

## 2018-08-11 MED FILL — FLUCONAZOLE 150 MG TABS: 150 | 5 days supply | Qty: 5 | Fill #0

## 2018-08-11 MED FILL — CLOTRIMAZOLE-BETAMETHASONE: 1-0.05 | 10 days supply | Qty: 15 | Fill #0

## 2018-08-12 ENCOUNTER — Telehealth: Payer: Self-pay | Admitting: Neurology

## 2018-08-12 NOTE — Telephone Encounter (Signed)
pt has called for the intrafusion suite, call transferred °

## 2018-08-31 ENCOUNTER — Telehealth: Payer: Self-pay | Admitting: *Deleted

## 2018-08-31 MED FILL — LOSARTAN POTASSIUM 100 MG T: 100 | 30 days supply | Qty: 30 | Fill #0

## 2018-08-31 MED FILL — HYDROCHLOROTHIAZIDE 25 MG T: 25 | 30 days supply | Qty: 30 | Fill #0

## 2018-08-31 MED FILL — ZONISAMIDE 100 MG CAPSULE: 100 | 30 days supply | Qty: 60 | Fill #0

## 2018-08-31 NOTE — Telephone Encounter (Signed)
Faxed completed/signed Tysabri pt status report and reauth questionnaire to [place] [facility] at [phone]. Received confirmation.  

## 2018-08-31 NOTE — Telephone Encounter (Signed)
Received fax notification from [facility] prescribing program that pt re-authorized for Tysabri from 08/31/18-03/30/19. Pt enrollment number: [id number]. Account: [employer]. Site auth number: [id number].

## 2018-09-09 MED FILL — BACLOFEN 20 MG TABLET: 20 | 30 days supply | Qty: 120 | Fill #1

## 2018-09-09 MED FILL — PHENTERMINE 37.5 MG TABLET: 37.5 | 30 days supply | Qty: 30 | Fill #0

## 2018-09-25 MED FILL — LOSARTAN POTASSIUM 100 MG T: 100 | 30 days supply | Qty: 30 | Fill #1

## 2018-09-25 MED FILL — ZONISAMIDE 100 MG CAPSULE: 100 | 30 days supply | Qty: 60 | Fill #1

## 2018-09-25 MED FILL — HYDROCHLOROTHIAZIDE 25 MG T: 25 | 30 days supply | Qty: 30 | Fill #1

## 2018-10-08 MED FILL — PHENTERMINE 37.5 MG TABLET: 37.5 | 30 days supply | Qty: 30 | Fill #1

## 2018-10-08 MED FILL — BACLOFEN 20 MG TABLET: 20 | 30 days supply | Qty: 120 | Fill #2

## 2018-10-26 MED FILL — LOSARTAN-HCTZ 100-25 MG TAB: 100-25 | 30 days supply | Qty: 30 | Fill #0

## 2018-10-26 MED FILL — ZONISAMIDE 100 MG CAPSULE: 100 | 30 days supply | Qty: 60 | Fill #2

## 2018-11-13 ENCOUNTER — Other Ambulatory Visit: Payer: Self-pay | Admitting: Neurology

## 2018-11-16 MED FILL — BACLOFEN 20 MG TABS: 20 | 30 days supply | Qty: 120 | Fill #0

## 2018-11-23 MED FILL — LOSARTAN-HCTZ 100-25 MG TAB: 100-25 | 30 days supply | Qty: 30 | Fill #1

## 2018-11-23 MED FILL — ZONISAMIDE 100 MG CAPSULE: 100 | 30 days supply | Qty: 60 | Fill #3

## 2018-12-16 ENCOUNTER — Ambulatory Visit (INDEPENDENT_AMBULATORY_CARE_PROVIDER_SITE_OTHER): Payer: 59 | Admitting: Neurology

## 2018-12-16 ENCOUNTER — Other Ambulatory Visit: Payer: Self-pay

## 2018-12-16 ENCOUNTER — Telehealth: Payer: Self-pay | Admitting: *Deleted

## 2018-12-16 ENCOUNTER — Encounter: Payer: Self-pay | Admitting: Neurology

## 2018-12-16 VITALS — BP 111/77 | HR 91 | Temp 98.6°F | Ht 64.0 in | Wt 201.0 lb

## 2018-12-16 DIAGNOSIS — Z79899 Other long term (current) drug therapy: Secondary | ICD-10-CM | POA: Diagnosis not present

## 2018-12-16 DIAGNOSIS — R5383 Other fatigue: Secondary | ICD-10-CM

## 2018-12-16 DIAGNOSIS — G43009 Migraine without aura, not intractable, without status migrainosus: Secondary | ICD-10-CM | POA: Diagnosis not present

## 2018-12-16 DIAGNOSIS — G35 Multiple sclerosis: Secondary | ICD-10-CM | POA: Diagnosis not present

## 2018-12-16 MED ORDER — BACLOFEN 20 MG PO TABS: ORAL_TABLET | ORAL | 3 refills | Status: DC
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
## 2018-12-16 NOTE — Telephone Encounter (Signed)
Placed JCV lab in [facility] lock box for routine lab pick up. Results pending. 

## 2018-12-17 LAB — CBC WITH DIFFERENTIAL/PLATELET
Basophils Absolute: 0 10*3/uL (ref 0.0–0.2)
Basos: 0 %
EOS (ABSOLUTE): 0.2 10*3/uL (ref 0.0–0.4)
Eos: 2 %
Hematocrit: 35.5 % (ref 34.0–46.6)
Hemoglobin: 11.8 g/dL (ref 11.1–15.9)
Immature Grans (Abs): 0 10*3/uL (ref 0.0–0.1)
Immature Granulocytes: 0 %
Lymphocytes Absolute: 4.5 10*3/uL — ABNORMAL HIGH (ref 0.7–3.1)
Lymphs: 39 %
MCH: 25.5 pg — ABNORMAL LOW (ref 26.6–33.0)
MCHC: 33.2 g/dL (ref 31.5–35.7)
MCV: 77 fL — ABNORMAL LOW (ref 79–97)
Monocytes Absolute: 0.8 10*3/uL (ref 0.1–0.9)
Monocytes: 7 %
NRBC: 1 % — ABNORMAL HIGH (ref 0–0)
Neutrophils Absolute: 5.9 10*3/uL (ref 1.4–7.0)
Neutrophils: 52 %
Platelets: 291 10*3/uL (ref 150–450)
RBC: 4.62 x10E6/uL (ref 3.77–5.28)
RDW: 15.4 % (ref 11.7–15.4)
WBC: 11.4 10*3/uL — ABNORMAL HIGH (ref 3.4–10.8)

## 2018-12-21 ENCOUNTER — Other Ambulatory Visit: Payer: Self-pay | Admitting: Neurology

## 2018-12-21 MED FILL — BACLOFEN 20 MG TABLET: 20 | 30 days supply | Qty: 120 | Fill #1

## 2018-12-21 MED FILL — ZONISAMIDE 100 MG CAPSULE: 100 | 30 days supply | Qty: 60 | Fill #0

## 2018-12-28 MED FILL — LOSARTAN-HCTZ 100-25 MG TAB: 100-25 | 30 days supply | Qty: 30 | Fill #2

## 2018-12-28 NOTE — Telephone Encounter (Signed)
JCV ab drawn on 12/16/18 indeterminate, index: 0.27.  Inhibition assay: negative

## 2019-01-07 MED FILL — ZONISAMIDE 100 MG CAPSULE: 100 | 30 days supply | Qty: 60 | Fill #1

## 2019-01-07 MED FILL — BACLOFEN 20 MG TABLET: 20 | 30 days supply | Qty: 120 | Fill #2

## 2019-01-29 ENCOUNTER — Telehealth: Payer: Self-pay | Admitting: Neurology

## 2019-01-29 NOTE — Telephone Encounter (Signed)
Pt is asking for a call to discuss a medication for energy.  Pt stated the last medication she was given for energy clashed with her other medications.  Please call(pt aware office is not open on Fridays at this time)

## 2019-02-01 MED ORDER — ARMODAFINIL 200 MG PO TABS: ORAL_TABLET | ORAL | 5 refills | Status: DC

## 2019-02-01 MED FILL — LOSARTAN-HCTZ 100-25 MG TAB: 100-25 | 30 days supply | Qty: 30 | Fill #3

## 2019-02-01 NOTE — Telephone Encounter (Signed)
Called pt. Relayed Dr. Garth Bigness recommendation. She is agreeable to try this. Advised it may require PA via insurance.

## 2019-02-01 NOTE — Telephone Encounter (Signed)
Last medication was phnetermine  Lets try armodafinil 200 mg    1/2 to 1 pill in the morning   #30   #5 ref

## 2019-02-01 NOTE — Telephone Encounter (Signed)
Dr. Sater- what would you recommend? 

## 2019-02-01 NOTE — Addendum Note (Signed)
Addended by: Arlice Colt A on: 02/01/2019 02:41 PM   Modules accepted: Orders

## 2019-02-01 NOTE — Addendum Note (Signed)
Addended by: Hope Pigeon on: 02/01/2019 09:25 AM   Modules accepted: Orders

## 2019-02-03 ENCOUNTER — Telehealth: Payer: Self-pay | Admitting: Neurology

## 2019-02-03 MED FILL — ARMODAFINIL 200 MG TABS: 200 | 30 days supply | Qty: 30 | Fill #0

## 2019-02-03 NOTE — Telephone Encounter (Signed)
Request Reference Number: [id number]. ARMODAFINIL TAB 200MG  is approved through 02/03/2020. For further questions, call [phone].  I called to let her know PA approved. She should be able to pick up from pharmacy. She verbalized understanding.

## 2019-02-03 NOTE — Telephone Encounter (Signed)
We had not received a PA request for armodafinil. I submitted one on CMM. Key: [id number].  PA Case ID: [id number]. Waiting on determination from [employer].

## 2019-02-03 NOTE — Telephone Encounter (Signed)
Pt called wanting to know if a PA has been received for her Armodafinil 200 mg    Please advise.

## 2019-02-19 MED FILL — ZONISAMIDE 100 MG CAPSULE: 100 | 30 days supply | Qty: 60 | Fill #2

## 2019-02-19 MED FILL — BACLOFEN 20 MG TABLET: 20 | 30 days supply | Qty: 120 | Fill #3

## 2019-03-01 ENCOUNTER — Telehealth: Payer: Self-pay | Admitting: *Deleted

## 2019-03-01 NOTE — Telephone Encounter (Signed)
Received fax from touch prescribing program that patient re-authorized for Tysabri 03/01/2019-09/29/2019. Pt enrollment number [id number]. Account: [employer]. Site auth number: [id number].

## 2019-03-01 NOTE — Telephone Encounter (Signed)
Faxed completed/signed Tysabri pt status report and reauth questionnaire to [place] [facility] at [phone]. Received confirmation.  

## 2019-03-05 MED FILL — LOSARTAN-HCTZ 100-25 MG TAB: 100-25 | 30 days supply | Qty: 30 | Fill #4

## 2019-03-18 MED FILL — ARMODAFINIL 200 MG TABS: 200 | 30 days supply | Qty: 30 | Fill #1

## 2019-03-18 MED FILL — ZONISAMIDE 100 MG CAPSULE: 100 | 30 days supply | Qty: 60 | Fill #0

## 2019-04-09 MED FILL — LOSARTAN-HCTZ 100-25 MG TAB: 100-25 | 30 days supply | Qty: 30 | Fill #5

## 2019-04-09 MED FILL — BACLOFEN 20 MG TABLET: 20 | 30 days supply | Qty: 120 | Fill #4

## 2019-04-19 MED FILL — ARMODAFINIL 200 MG TABS: 200 | 30 days supply | Qty: 30 | Fill #2

## 2019-04-20 MED FILL — ZONISAMIDE 100 MG CAPSULE: 100 | 30 days supply | Qty: 60 | Fill #1

## 2019-04-29 ENCOUNTER — Telehealth: Payer: Self-pay | Admitting: Neurology

## 2019-04-29 NOTE — Telephone Encounter (Signed)
Pt  States she has had the [employer] assistance but she is getting bills in error for her treatment. Pt is asking for a call from RN as to what she needs to do

## 2019-04-29 NOTE — Telephone Encounter (Signed)
Printed message and gave to infusion suite to contact pt.

## 2019-05-13 MED FILL — LOSARTAN-HCTZ 100-25 MG TAB: 100-25 | 30 days supply | Qty: 30 | Fill #6

## 2019-05-17 MED FILL — BACLOFEN 20 MG TABS: 20 | 30 days supply | Qty: 120 | Fill #5

## 2019-05-27 MED FILL — ARMODAFINIL 200 MG TABLET: 200 | 30 days supply | Qty: 30 | Fill #3

## 2019-05-27 MED FILL — ZONISAMIDE 100 MG CAPSULE: 100 | 30 days supply | Qty: 60 | Fill #2

## 2019-06-14 ENCOUNTER — Encounter (HOSPITAL_COMMUNITY): Payer: Self-pay

## 2019-06-14 ENCOUNTER — Other Ambulatory Visit: Payer: Self-pay

## 2019-06-14 ENCOUNTER — Ambulatory Visit (HOSPITAL_COMMUNITY)
Admission: EM | Admit: 2019-06-14 | Discharge: 2019-06-14 | Disposition: A | Discharge status: Home / Self Care | Payer: 59 | Attending: Physician Assistant | Admitting: Physician Assistant

## 2019-06-14 DIAGNOSIS — Z20822 Contact with and (suspected) exposure to covid-19: Secondary | ICD-10-CM | POA: Insufficient documentation

## 2019-06-14 DIAGNOSIS — R067 Sneezing: Secondary | ICD-10-CM | POA: Diagnosis not present

## 2019-06-14 DIAGNOSIS — R0981 Nasal congestion: Secondary | ICD-10-CM | POA: Insufficient documentation

## 2019-06-14 DIAGNOSIS — J3489 Other specified disorders of nose and nasal sinuses: Secondary | ICD-10-CM | POA: Diagnosis not present

## 2019-06-14 NOTE — ED Triage Notes (Signed)
Pt states last on 06/07/2019 that was her last contact with a family member who informed her today that they were POSITIVE. Pt states she has nasal congestion and sneezing since yesterday.

## 2019-06-14 NOTE — Discharge Instructions (Signed)
Monitor your symptoms closely.  If you have a positive test, someone from [facility] will call you about potential treatments.  Go directly to the Emergency Department or call 911 if you have severe chest pain, shortness of breath, severe diarrhea or feel as though you might pass out.    If your Covid-19 test is positive, you will receive a phone call from [facility] regarding your results. Negative test results are not called. Both positive and negative results area always visible on MyChart. If you do not have a MyChart account, sign up instructions are in your discharge papers.   Persons who are directed to care for themselves at home may discontinue isolation under the following conditions:   At least 10 days have passed since symptom onset and  At least 24 hours have passed without running a fever (this means without the use of fever-reducing medications) and  Other symptoms have improved.  Persons infected with COVID-19 who never develop symptoms may discontinue isolation and other precautions 10 days after the date of their first positive COVID-19 test.

## 2019-06-14 NOTE — ED Provider Notes (Signed)
[place]    CSN: [id number] Arrival date & time: 06/14/19  1758      History   Chief Complaint Chief Complaint  Patient presents with  . Nasal Congestion    HPI Amanda Horne is a 34 y.o. female.   Patient with a past medical history of MS presents for COVID testing due to runny nose and sneezing with a known positive COVID exposure on 06/07/2019. Her symptoms started this morning but she was around the person who tested positive 1 week ago. She reports only clear runny nose and some sneezing. Denies cough, congestion, shortness of breath, sore throat, headache, nausea, vomiting, diarrhea, change or loss of smell or taste.  She believes could be allergies but wants to be sure. She has not taken medications for this.       Past Medical History:  Diagnosis Date  . Abnormal Pap smear   . Herpes   . Hx of varicella   . Hypertension   . Multiple sclerosis ([place])   . Vision abnormalities     Patient Active Problem List   Diagnosis Date Noted  . CTS (carpal tunnel syndrome) 01/23/2018  . Common migraine 12/10/2017  . Vitamin D deficiency 12/10/2017  . Midline back pain 06/12/2017  . Neck pain 01/29/2017  . Insomnia 12/03/2016  . Depression with anxiety 12/03/2016  . Morbid obesity ([place]) 12/03/2016  . Other fatigue 12/03/2016  . Gait disturbance 09/03/2016  . High risk medication use 09/03/2016  . Essential hypertension   . Numbness   . Multiple sclerosis ([place]) 08/20/2016  . Facial numbness 08/20/2016  . Vertigo 08/20/2016  . Headache 08/20/2016  . Double vision     Past Surgical History:  Procedure Laterality Date  . COLPOSCOPY    . WISDOM TOOTH EXTRACTION      OB History    Gravida  3   Para  3   Term  3   Preterm      AB      Living  3     SAB      TAB      Ectopic      Multiple      Live Births  3            Home Medications    Prior to Admission medications   Medication Sig Start Date End Date Taking?  Authorizing Provider  Armodafinil 200 MG TABS Take 1/2 to 1 tablet by mouth every morning 02/01/19   Sater, Nanine Means, MD  baclofen (LIORESAL) 20 MG tablet TAKE 3 TO 4 TABLETS BY MOUTH DAILY FOR SPASTICITY 12/16/18   Sater, Nanine Means, MD  buPROPion (WELLBUTRIN) 75 MG tablet Take 150 mg by mouth every morning. 04/26/19   [provider]  CALCIUM PO Take 1 Dose by mouth daily.    [provider]  cholecalciferol (VITAMIN D) 1000 units tablet Take 2,000 Units by mouth daily.    [provider]  hydrochlorothiazide (HYDRODIURIL) 25 MG tablet TAKE 1 TABLET (25 MG TOTAL) BY MOUTH DAILY. 08/03/18   Sater, Nanine Means, MD  hydrOXYzine (ATARAX/VISTARIL) 25 MG tablet Take 1 tablet by mouth as needed. 04/23/18   [provider]  hydrOXYzine (VISTARIL) 25 MG capsule Take 25-50 mg by mouth daily as needed. 04/26/19   [provider]  losartan-hydrochlorothiazide (HYZAAR) 100-25 MG tablet Take 1 tablet by mouth daily. 06/12/17   Sater, Nanine Means, MD  MedroxyPROGESTERone Acetate (DEPO-PROVERA IM) Inject into the muscle every 3 (  three) months.    [provider]  medroxyPROGESTERone Acetate 150 MG/ML SUSY  06/01/19   [provider]  natalizumab (TYSABRI) 300 MG/15ML injection Inject 300 mg into the vein every 30 (thirty) days.    [provider]  sertraline (ZOLOFT) 100 MG tablet Take 100 mg by mouth daily. 05/12/18   [provider]  UNABLE TO FIND Vitality Multivitamin and Mineral    [provider]  UNABLE TO FIND Take 1 Dose by mouth daily. Med Name: Ogranic beet root    [provider]  zonisamide (ZONEGRAN) 100 MG capsule TAKE 1 TO 2 CAPSULES BY MOUTH NIGHTLY 12/21/18   Sater, Pearletha Furl, MD    Family History Family History  Problem Relation Age of Onset  . Hypertension Mother   . Diabetes Mother   . Diabetes Sister   . Other Paternal Uncle        Wiskott Aldnch syndrome, died as a child  . Healthy Father      Social History Social History   Tobacco Use  . Smoking status: Never Smoker  . Smokeless tobacco: Never Used  Substance Use Topics  . Alcohol use: No  . Drug use: No     Allergies   Patient has no known allergies.   Review of Systems Review of Systems  Constitutional: Negative for chills, fatigue and fever.  HENT: Positive for rhinorrhea and sneezing. Negative for congestion, ear pain, postnasal drip, sinus pressure, sinus pain and sore throat.   Eyes: Negative for pain and visual disturbance.  Respiratory: Negative for cough and shortness of breath.   Cardiovascular: Negative for chest pain and palpitations.  Gastrointestinal: Negative for abdominal pain, diarrhea, nausea and vomiting.  Endocrine: Negative.   Genitourinary: Negative.   Musculoskeletal: Negative for arthralgias and back pain.  Skin: Negative for color change and rash.  Neurological: Negative for headaches.  All other systems reviewed and are negative.    Physical Exam Triage Vital Signs ED Triage Vitals  Enc Vitals Group     BP      Pulse      Resp      Temp      Temp src      SpO2      Weight      Height      Head Circumference      Peak Flow      Pain Score      Pain Loc      Pain Edu?      Excl. in GC?    No data found.  Updated Vital Signs BP 133/79 (BP Location: Right Arm)   Pulse 99   Temp 99.2 F (37.3 C) (Oral)   Resp 17   Wt 195 lb 6.4 oz (88.6 kg)   SpO2 97%   BMI 33.54 kg/m   Visual Acuity Right Eye Distance:   Left Eye Distance:   Bilateral Distance:    Right Eye Near:   Left Eye Near:    Bilateral Near:     Physical Exam Vitals and nursing note reviewed.  Constitutional:      General: She is not in acute distress.    Appearance: She is well-developed and normal weight. She is not ill-appearing.  HENT:     Head: Normocephalic and atraumatic.     Nose: Rhinorrhea present. No congestion.     Comments: Turbinates with erythema     Mouth/Throat:      Mouth: Mucous membranes are moist.  Pharynx: Oropharynx is clear.  Eyes:     General: No scleral icterus.    Conjunctiva/sclera: Conjunctivae normal.  Cardiovascular:     Rate and Rhythm: Normal rate and regular rhythm.     Heart sounds: No murmur. No friction rub. No gallop.   Pulmonary:     Effort: Pulmonary effort is normal. No respiratory distress.     Breath sounds: Normal breath sounds. No wheezing, rhonchi or rales.  Musculoskeletal:     Cervical back: Neck supple.     Right lower leg: No edema.     Left lower leg: No edema.  Skin:    General: Skin is warm and dry.  Neurological:     General: No focal deficit present.     Mental Status: She is alert and oriented to person, place, and time.  Psychiatric:        Mood and Affect: Mood normal.        Behavior: Behavior normal.        Thought Content: Thought content normal.        Judgment: Judgment normal.      UC Treatments / Results  Labs (all labs ordered are listed, but only abnormal results are displayed) Labs Reviewed  NOVEL CORONAVIRUS, NAA (HOSP ORDER, SEND-OUT TO REF LAB; TAT 18-24 HRS)    EKG   Radiology No results found.  Procedures Procedures (including critical care time)  Medications Ordered in UC Medications - No data to display  Initial Impression / Assessment and Plan / UC Course  I have reviewed the triage vital signs and the nursing notes.  Pertinent labs & imaging results that were available during my care of the patient were reviewed by me and considered in my medical decision making (see chart for details).     #nasal congestion - viral vs allergy. COVID PCR sent. Precautions discussed.   Final Clinical Impressions(s) / UC Diagnoses   Final diagnoses:  Nasal congestion  Encounter for laboratory testing for COVID-19 virus     Discharge Instructions     Monitor your symptoms closely.  If you have a positive test, someone from [facility] will call you about potential  treatments.  Go directly to the Emergency Department or call 911 if you have severe chest pain, shortness of breath, severe diarrhea or feel as though you might pass out.    If your Covid-19 test is positive, you will receive a phone call from [facility] regarding your results. Negative test results are not called. Both positive and negative results area always visible on MyChart. If you do not have a MyChart account, sign up instructions are in your discharge papers.   Persons who are directed to care for themselves at home may discontinue isolation under the following conditions:  . At least 10 days have passed since symptom onset and . At least 24 hours have passed without running a fever (this means without the use of fever-reducing medications) and . Other symptoms have improved.  Persons infected with COVID-19 who never develop symptoms may discontinue isolation and other precautions 10 days after the date of their first positive COVID-19 test.     ED Prescriptions    None     PDMP not reviewed this encounter.   Hermelinda Medicus, PA-C 06/14/19 1849

## 2019-06-16 LAB — NOVEL CORONAVIRUS, NAA (HOSP ORDER, SEND-OUT TO REF LAB; TAT 18-24 HRS): SARS-CoV-2, NAA: NOT DETECTED

## 2019-06-23 ENCOUNTER — Encounter: Payer: Self-pay | Admitting: Neurology

## 2019-06-23 ENCOUNTER — Ambulatory Visit (INDEPENDENT_AMBULATORY_CARE_PROVIDER_SITE_OTHER): Payer: 59 | Admitting: Neurology

## 2019-06-23 ENCOUNTER — Other Ambulatory Visit: Payer: Self-pay

## 2019-06-23 ENCOUNTER — Telehealth: Payer: Self-pay | Admitting: *Deleted

## 2019-06-23 VITALS — BP 114/71 | HR 87 | Temp 97.9°F | Ht 64.0 in | Wt 189.5 lb

## 2019-06-23 DIAGNOSIS — G35 Multiple sclerosis: Secondary | ICD-10-CM

## 2019-06-23 DIAGNOSIS — E559 Vitamin D deficiency, unspecified: Secondary | ICD-10-CM | POA: Diagnosis not present

## 2019-06-23 DIAGNOSIS — G35D Multiple sclerosis, unspecified: Secondary | ICD-10-CM

## 2019-06-23 DIAGNOSIS — Z79899 Other long term (current) drug therapy: Secondary | ICD-10-CM

## 2019-06-23 MED ORDER — ARMODAFINIL 200 MG PO TABS: ORAL_TABLET | ORAL | 1 refills | Status: DC

## 2019-06-23 MED ORDER — BACLOFEN 20 MG PO TABS: ORAL_TABLET | ORAL | 3 refills | Status: DC

## 2019-06-23 MED ORDER — ZONISAMIDE 100 MG PO CAPS: ORAL_CAPSULE | ORAL | 4 refills | Status: DC
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
## 2019-06-23 NOTE — Telephone Encounter (Signed)
Placed JCV lab in [facility] lock box for routine lab pick up. Results pending. 

## 2019-06-24 LAB — CBC WITH DIFFERENTIAL/PLATELET
Basophils Absolute: 0.1 10*3/uL (ref 0.0–0.2)
Basos: 0 %
EOS (ABSOLUTE): 0.2 10*3/uL (ref 0.0–0.4)
Eos: 1 %
Hematocrit: 37.9 % (ref 34.0–46.6)
Hemoglobin: 12.8 g/dL (ref 11.1–15.9)
Immature Grans (Abs): 0.1 10*3/uL (ref 0.0–0.1)
Immature Granulocytes: 1 %
Lymphocytes Absolute: 5.5 10*3/uL — ABNORMAL HIGH (ref 0.7–3.1)
Lymphs: 40 %
MCH: 25 pg — ABNORMAL LOW (ref 26.6–33.0)
MCHC: 33.8 g/dL (ref 31.5–35.7)
MCV: 74 fL — ABNORMAL LOW (ref 79–97)
Monocytes Absolute: 0.8 10*3/uL (ref 0.1–0.9)
Monocytes: 6 %
Neutrophils Absolute: 7.2 10*3/uL — ABNORMAL HIGH (ref 1.4–7.0)
Neutrophils: 52 %
Platelets: 263 10*3/uL (ref 150–450)
RBC: 5.13 x10E6/uL (ref 3.77–5.28)
RDW: 15.2 % (ref 11.7–15.4)
WBC: 13.8 10*3/uL — ABNORMAL HIGH (ref 3.4–10.8)

## 2019-06-24 LAB — VITAMIN D 25 HYDROXY (VIT D DEFICIENCY, FRACTURES): Vit D, 25-Hydroxy: 40 ng/mL (ref 30.0–100.0)

## 2019-07-05 NOTE — Telephone Encounter (Signed)
JCV ab drawn on 06/23/19 indeterminate, index: 0.37. Inhibition assay: negative

## 2019-07-27 DIAGNOSIS — Z0289 Encounter for other administrative examinations: Secondary | ICD-10-CM

## 2019-07-28 ENCOUNTER — Telehealth: Payer: Self-pay | Admitting: *Deleted

## 2019-07-28 NOTE — Telephone Encounter (Signed)
Gave completed/signed [employer]MLA form back to medical records to process for pt.

## 2019-07-29 ENCOUNTER — Telehealth: Payer: Self-pay | Admitting: *Deleted

## 2019-07-29 NOTE — Telephone Encounter (Signed)
Pt [employer] form faxed to [phone] 

## 2019-08-30 ENCOUNTER — Telehealth: Payer: Self-pay | Admitting: *Deleted

## 2019-08-30 NOTE — Telephone Encounter (Signed)
Faxed completed/signed Tysabri pt status report and reauth questionnaire to [place] [facility] at [phone]. Received confirmation.  

## 2019-08-30 NOTE — Telephone Encounter (Signed)
Received fax from touch prescribing program that pt re-authorized from 08/30/2019-03/30/2020. Pt enrollment number: [id number]. Account: [employer]. Site auth number: [id number].

## 2019-10-26 ENCOUNTER — Telehealth: Payer: Self-pay | Admitting: Neurology

## 2019-10-26 NOTE — Telephone Encounter (Signed)
Called and LVM returning pt call. It would be best to schedule apt for her to see Dr. Epimenio Foot. Offered 11/02/19 at 1pm (placed on hold).

## 2019-10-26 NOTE — Telephone Encounter (Signed)
Pt called wanting to discuss symptoms she has been having recently. She states she has had a headache everyday starting 2 weeks ago, she has been tired, sleepy and is having loss of balance now as well. Pt states the medications are not helping. Please advise.

## 2019-10-26 NOTE — Telephone Encounter (Signed)
Noted, thank you

## 2019-10-26 NOTE — Telephone Encounter (Signed)
Pt has called in response to message from [facility].  Pt accepted the appointment for 06-15, pt aware she needs to check in at 12:30.  No call back requested

## 2019-11-02 ENCOUNTER — Ambulatory Visit (INDEPENDENT_AMBULATORY_CARE_PROVIDER_SITE_OTHER): Payer: 59 | Admitting: Neurology

## 2019-11-02 ENCOUNTER — Encounter: Payer: Self-pay | Admitting: Neurology

## 2019-11-02 VITALS — BP 114/78 | HR 81 | Ht 63.0 in | Wt 185.0 lb

## 2019-11-02 DIAGNOSIS — E559 Vitamin D deficiency, unspecified: Secondary | ICD-10-CM | POA: Diagnosis not present

## 2019-11-02 DIAGNOSIS — G43009 Migraine without aura, not intractable, without status migrainosus: Secondary | ICD-10-CM

## 2019-11-02 DIAGNOSIS — R269 Unspecified abnormalities of gait and mobility: Secondary | ICD-10-CM

## 2019-11-02 DIAGNOSIS — Z79899 Other long term (current) drug therapy: Secondary | ICD-10-CM

## 2019-11-02 DIAGNOSIS — G35 Multiple sclerosis: Secondary | ICD-10-CM

## 2019-11-02 MED ORDER — ALPRAZOLAM 0.5 MG PO TABS: ORAL_TABLET | ORAL | 0 refills | Status: DC

## 2019-11-02 MED ORDER — PHENTERMINE HCL 15 MG PO CAPS: 15.0000 mg | ORAL_CAPSULE | ORAL | 5 refills | Status: DC
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
## 2019-11-02 NOTE — Progress Notes (Signed)
JCV placed in pick up box at [facility]

## 2019-11-03 LAB — CBC WITH DIFFERENTIAL/PLATELET
Basophils Absolute: 0.1 10*3/uL (ref 0.0–0.2)
Basos: 1 %
EOS (ABSOLUTE): 0.2 10*3/uL (ref 0.0–0.4)
Eos: 1 %
Hematocrit: 38.1 % (ref 34.0–46.6)
Hemoglobin: 12.5 g/dL (ref 11.1–15.9)
Immature Grans (Abs): 0.1 10*3/uL (ref 0.0–0.1)
Immature Granulocytes: 1 %
Lymphocytes Absolute: 4.6 10*3/uL — ABNORMAL HIGH (ref 0.7–3.1)
Lymphs: 41 %
MCH: 24.8 pg — ABNORMAL LOW (ref 26.6–33.0)
MCHC: 32.8 g/dL (ref 31.5–35.7)
MCV: 76 fL — ABNORMAL LOW (ref 79–97)
Monocytes Absolute: 0.7 10*3/uL (ref 0.1–0.9)
Monocytes: 6 %
NRBC: 1 % — ABNORMAL HIGH (ref 0–0)
Neutrophils Absolute: 5.6 10*3/uL (ref 1.4–7.0)
Neutrophils: 50 %
Platelets: 242 10*3/uL (ref 150–450)
RBC: 5.04 x10E6/uL (ref 3.77–5.28)
RDW: 14.5 % (ref 11.7–15.4)
WBC: 11.1 10*3/uL — ABNORMAL HIGH (ref 3.4–10.8)

## 2019-11-04 ENCOUNTER — Telehealth: Payer: Self-pay | Admitting: Neurology

## 2019-11-04 NOTE — Telephone Encounter (Signed)
[employer] Auth: [id number] (exp. 11/04/19 to 12/19/19) order sent to [facility]. They will reach out to the patient to schedule.

## 2019-11-09 ENCOUNTER — Telehealth: Payer: Self-pay | Admitting: *Deleted

## 2019-11-09 NOTE — Telephone Encounter (Signed)
JCV ab (sent to [facility]) drawn on 11/02/19 indeterminate. Index: 0.24. Inhibition assay: negative.

## 2019-11-10 ENCOUNTER — Other Ambulatory Visit: Payer: Self-pay

## 2019-11-10 ENCOUNTER — Ambulatory Visit
Admission: RE | Admit: 2019-11-10 | Discharge: 2019-11-10 | Disposition: A | Discharge status: Home / Self Care | Payer: 59 | Source: Ambulatory Visit | Attending: Neurology | Admitting: Neurology

## 2019-11-10 DIAGNOSIS — G35 Multiple sclerosis: Secondary | ICD-10-CM

## 2019-11-10 MED ORDER — GADOBENATE DIMEGLUMINE 529 MG/ML IV SOLN
17.0000 mL | Freq: Once | INTRAVENOUS | Status: AC | PRN
  Administered 2019-11-10: 17 mL via INTRAVENOUS

## 2019-11-11 ENCOUNTER — Telehealth: Payer: Self-pay | Admitting: *Deleted

## 2019-11-11 MED ORDER — AZITHROMYCIN 250 MG PO TABS: ORAL_TABLET | ORAL | 0 refills | Status: DC

## 2019-11-11 NOTE — Telephone Encounter (Signed)
-----   Message from Asa Lente, MD sent at 11/10/2019  9:04 PM EDT ----- Please let her know that the MRI of the brain looks good.  There are no new lesions.  The MRI did show some chronic sinusitis.  Please see if she has any sinusitis symptoms and call in a Z-Pak if she does.  Thank you

## 2019-11-11 NOTE — Telephone Encounter (Signed)
Called and spoke with pt about results per Dr. Epimenio Foot note. She verbalized understanding and does have sinusitis sx. She is agreeable to try zpack. E-scribed to pharmacy on file. She will f/u with PCP if this does not help clear up sx. She is aware phones/faxes are currently down and will reach out via mychart if needed.

## 2019-12-21 ENCOUNTER — Other Ambulatory Visit: Payer: Self-pay

## 2019-12-21 ENCOUNTER — Ambulatory Visit (INDEPENDENT_AMBULATORY_CARE_PROVIDER_SITE_OTHER): Payer: 59 | Admitting: Family Medicine

## 2019-12-21 ENCOUNTER — Encounter: Payer: Self-pay | Admitting: Family Medicine

## 2019-12-21 ENCOUNTER — Telehealth: Payer: Self-pay | Admitting: *Deleted

## 2019-12-21 VITALS — BP 115/74 | HR 82 | Ht 65.0 in | Wt 188.0 lb

## 2019-12-21 DIAGNOSIS — R5383 Other fatigue: Secondary | ICD-10-CM

## 2019-12-21 DIAGNOSIS — G35 Multiple sclerosis: Secondary | ICD-10-CM | POA: Diagnosis not present

## 2019-12-21 DIAGNOSIS — G43009 Migraine without aura, not intractable, without status migrainosus: Secondary | ICD-10-CM | POA: Diagnosis not present

## 2019-12-21 DIAGNOSIS — Z79899 Other long term (current) drug therapy: Secondary | ICD-10-CM | POA: Diagnosis not present

## 2019-12-21 DIAGNOSIS — E559 Vitamin D deficiency, unspecified: Secondary | ICD-10-CM
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
## 2019-12-21 NOTE — Patient Instructions (Addendum)
Continue Tysabri as prescribed   Stay well hydrated. Focus on healthy lifestyle habits. Well balanced diet and regular exercise encouraged. Consider looking into a low glycemic diet. [employer], fresh/frozen vegetables and fruits should be the majority of your diet.   Follow up with Dr Epimenio Foot in 6 months   Multiple Sclerosis Multiple sclerosis (MS) is a disease of the brain, spinal cord, and optic nerves (central nervous system). It causes the body's disease-fighting (immune) system to destroy the protective covering (myelin sheath) around nerves in the brain. When this happens, signals (nerve impulses) going to and from the brain and spinal cord do not get sent properly or may not get sent at all. There are several types of MS:  Relapsing-remitting MS. This is the most common type. This causes sudden attacks of symptoms. After an attack, you may recover completely until the next attack, or some symptoms may remain permanently.  Secondary progressive MS. This usually develops after the onset of relapsing-remitting MS. Similar to relapsing-remitting MS, this type also causes sudden attacks of symptoms. Attacks may be less frequent, but symptoms slowly get worse (progress) over time.  Primary progressive MS. This causes symptoms that steadily progress over time. This type of MS does not cause sudden attacks of symptoms. The age of onset of MS varies, but it often develops between 65-93 years of age. MS is a lifelong (chronic) condition. There is no cure, but treatment can help slow down the progression of the disease. What are the causes? The cause of this condition is not known. What increases the risk? You are more likely to develop this condition if:  You are a woman.  You have a relative with MS. However, the condition is not passed from parent to child (inherited).  You have a lack (deficiency) of vitamin D.  You smoke. MS is more common in the [place] than in the  [place]. What are the signs or symptoms? Relapsing-remitting and secondary progressive MS cause symptoms to occur in episodes or attacks that may last weeks to months. There may be long periods between attacks in which there are almost no symptoms. Primary progressive MS causes symptoms to steadily progress after they develop. Symptoms of MS vary because of the many different ways it affects the central nervous system. The main symptoms include:  Vision problems and eye pain.  Numbness.  Weakness.  Inability to move your arms, hands, feet, or legs (paralysis).  Balance problems.  Shaking that you cannot control (tremors).  Muscle spasms.  Problems with thinking (cognitive changes). MS can also cause symptoms that are associated with the disease, but are not always the direct result of an MS attack. They may include:  Inability to control urination or bowel movements (incontinence).  Headaches.  Fatigue.  Inability to tolerate heat.  Emotional changes.  Depression.  Pain. How is this diagnosed? This condition is diagnosed based on:  Your symptoms.  A neurological exam. This involves checking central nervous system function, such as nerve function, reflexes, and coordination.  MRIs of the brain and spinal cord.  Lab tests, including a lumbar puncture that tests the fluid that surrounds the brain and spinal cord (cerebrospinal fluid).  Tests to measure the electrical activity of the brain in response to stimulation (evoked potentials). How is this treated? There is no cure for MS, but medicines can help decrease the number and frequency of attacks and help relieve nuisance symptoms. Treatment options may include:  Medicines that reduce the  frequency of attacks. These medicines may be given by injection, by mouth (orally), or through an IV.  Medicines that reduce inflammation (steroids). These may provide short-term relief of symptoms.  Medicines to  help control pain, depression, fatigue, or incontinence.  Vitamin D, if you have a deficiency.  Using devices to help you move around (assistive devices), such as braces, a cane, or a walker.  Physical therapy to strengthen and stretch your muscles.  Occupational therapy to help you with everyday tasks.  Alternative or complementary treatments such as exercise, massage, or acupuncture. Follow these instructions at home:  Take over-the-counter and prescription medicines only as told by your health care provider.  Do not drive or use heavy machinery while taking prescription pain medicine.  Use assistive devices as recommended by your physical therapist or your health care provider.  Exercise as directed by your health care provider.  Return to your normal activities as told by your health care provider. Ask your health care provider what activities are safe for you.  Reach out for support. Share your feelings with friends, family, or a support group.  Keep all follow-up visits as told by your health care provider and therapists. This is important. Where to find more information  National Multiple Sclerosis Society: https://www.nationalmssociety.org Contact a health care provider if:  You feel depressed.  You develop new pain or numbness.  You have tremors.  You have problems with sexual function. Get help right away if:  You develop paralysis.  You develop numbness.  You have problems with your bladder or bowel function.  You develop double vision.  You lose vision in one or both eyes.  You develop suicidal thoughts.  You develop severe confusion. If you ever feel like you may hurt yourself or others, or have thoughts about taking your own life, get help right away. You can go to your nearest emergency department or call:  Your local emergency services (911 in the [place]  A suicide crisis helpline, such as the National Suicide Prevention Lifeline at  (702)417-9821. This is open 24 hours a day. Summary  Multiple sclerosis (MS) is a disease of the central nervous system that causes the body's immune system to destroy the protective covering (myelin sheath) around nerves in the brain.  There are 3 types of MS: relapsing-remitting, secondary progressive, and primary progressive. Relapsing-remitting and secondary progressive MS cause symptoms to occur in episodes or attacks that may last weeks to months. Primary progressive MS causes symptoms to steadily progress after they develop.  There is no cure for MS, but medicines can help decrease the number and frequency of attacks and help relieve nuisance symptoms. Treatment may also include physical or occupational therapy.  If you develop numbness, paralysis, vision problems, or other neurological symptoms, get help right away. This information is not intended to replace advice given to you by your health care provider. Make sure you discuss any questions you have with your health care provider. Document Revised: 04/18/2017 Document Reviewed: 07/15/2016 Elsevier Patient Education  2020 [employer].

## 2019-12-21 NOTE — Progress Notes (Signed)
I have read the note, and I agree with the clinical assessment and plan.  Khalid Lacko A. Gwendolin Briel, MD, PhD, FAAN Certified in Neurology, Clinical Neurophysiology, Sleep Medicine, Pain Medicine and Neuroimaging  [facility] [address], [address] [place], [place] [address] [phone]  

## 2019-12-21 NOTE — Telephone Encounter (Signed)
Placed JCV lab in [facility] lock box for routine lab pick up. Results pending. 

## 2019-12-22 LAB — CBC WITH DIFFERENTIAL/PLATELET
Basophils Absolute: 0 10*3/uL (ref 0.0–0.2)
Basos: 0 %
EOS (ABSOLUTE): 0.2 10*3/uL (ref 0.0–0.4)
Eos: 2 %
Hematocrit: 35.9 % (ref 34.0–46.6)
Hemoglobin: 12 g/dL (ref 11.1–15.9)
Immature Grans (Abs): 0.1 10*3/uL (ref 0.0–0.1)
Immature Granulocytes: 1 %
Lymphocytes Absolute: 4.7 10*3/uL — ABNORMAL HIGH (ref 0.7–3.1)
Lymphs: 42 %
MCH: 24.6 pg — ABNORMAL LOW (ref 26.6–33.0)
MCHC: 33.4 g/dL (ref 31.5–35.7)
MCV: 74 fL — ABNORMAL LOW (ref 79–97)
Monocytes Absolute: 0.7 10*3/uL (ref 0.1–0.9)
Monocytes: 6 %
NRBC: 1 % — ABNORMAL HIGH (ref 0–0)
Neutrophils Absolute: 5.5 10*3/uL (ref 1.4–7.0)
Neutrophils: 49 %
Platelets: 246 10*3/uL (ref 150–450)
RBC: 4.87 x10E6/uL (ref 3.77–5.28)
RDW: 15.1 % (ref 11.7–15.4)
WBC: 11.2 10*3/uL — ABNORMAL HIGH (ref 3.4–10.8)

## 2019-12-22 LAB — COMPREHENSIVE METABOLIC PANEL
ALT: 32 IU/L (ref 0–32)
AST: 26 IU/L (ref 0–40)
Albumin/Globulin Ratio: 1.7 (ref 1.2–2.2)
Albumin: 4.3 g/dL (ref 3.8–4.8)
Alkaline Phosphatase: 89 IU/L (ref 48–121)
BUN/Creatinine Ratio: 19 (ref 9–23)
BUN: 14 mg/dL (ref 6–20)
Bilirubin Total: 0.3 mg/dL (ref 0.0–1.2)
CO2: 22 mmol/L (ref 20–29)
Calcium: 9.3 mg/dL (ref 8.7–10.2)
Chloride: 106 mmol/L (ref 96–106)
Creatinine, Ser: 0.74 mg/dL (ref 0.57–1.00)
GFR calc Af Amer: 121 mL/min/{1.73_m2} (ref >59)
GFR calc non Af Amer: 105 mL/min/{1.73_m2} (ref >59)
Globulin, Total: 2.5 g/dL (ref 1.5–4.5)
Glucose: 87 mg/dL (ref 65–99)
Potassium: 3.8 mmol/L (ref 3.5–5.2)
Sodium: 141 mmol/L (ref 134–144)
Total Protein: 6.8 g/dL (ref 6.0–8.5)

## 2019-12-22 LAB — VITAMIN D 25 HYDROXY (VIT D DEFICIENCY, FRACTURES): Vit D, 25-Hydroxy: 42.4 ng/mL (ref 30.0–100.0)

## 2019-12-26 LAB — RFLX STRATIFY JCV (TM) AB INHIBITION: JCV Antibody by Inhibition: NEGATIVE

## 2019-12-26 LAB — STRATIFY JCV AB (W/ INDEX) W/ RFLX
Index Value: 0.37 — ABNORMAL HIGH
Stratify JCV (TM) Ab w/Reflex Inhibition: UNDETERMINED — AB

## 2019-12-28 ENCOUNTER — Telehealth: Payer: Self-pay | Admitting: *Deleted

## 2019-12-28 NOTE — Telephone Encounter (Signed)
Relayed per AL/NP that her labs look stable. Her WBC and lymphocyte count is a little elevated but is stable for the past two years. All other blood counts look ok. JCV inhibition assay was negative. Vitamin D is good. We will continue current treatment plan. Pt verbalized understanding.

## 2019-12-28 NOTE — Telephone Encounter (Signed)
-----   Message from Shawnie Dapper, NP sent at 12/27/2019  4:32 PM EDT ----- Please let her know that her labs look stable. Her WBC and lymphocyte count is a little elevated but is stable for the past two years. All other blood counts look ok. JCV inhibition assay was negative. Vitamin D is good. We will continue current treatment plan.

## 2020-02-16 ENCOUNTER — Ambulatory Visit (INDEPENDENT_AMBULATORY_CARE_PROVIDER_SITE_OTHER): Payer: 59

## 2020-02-16 ENCOUNTER — Other Ambulatory Visit: Payer: Self-pay

## 2020-02-16 ENCOUNTER — Ambulatory Visit (HOSPITAL_COMMUNITY)
Admission: EM | Admit: 2020-02-16 | Discharge: 2020-02-16 | Disposition: A | Discharge status: Home / Self Care | Payer: 59 | Attending: Internal Medicine | Admitting: Internal Medicine

## 2020-02-16 ENCOUNTER — Encounter (HOSPITAL_COMMUNITY): Payer: Self-pay | Admitting: Emergency Medicine

## 2020-02-16 DIAGNOSIS — R05 Cough: Secondary | ICD-10-CM

## 2020-02-16 DIAGNOSIS — R0600 Dyspnea, unspecified: Secondary | ICD-10-CM

## 2020-02-16 DIAGNOSIS — R079 Chest pain, unspecified: Secondary | ICD-10-CM

## 2020-02-16 NOTE — Discharge Instructions (Signed)
Your vitals, chest xray, and ekg are very reassuring today.  Please follow up with your primary care provider as needed if recurrent, or go to the ER for any worsening of symptoms.

## 2020-02-16 NOTE — ED Provider Notes (Signed)
[facility]    CSN: [id number] Arrival date & time: 02/16/20  1645      History   Chief Complaint Chief Complaint  Patient presents with  . Chest Pain  . Shortness of Breath    HPI Amanda Horne is a 34 y.o. female.   Amanda Horne presents with complaints of chest pain, cough and shortness of breath . She was getting her natalizumab infusion today at doctor's office, the nurse rolled the end of the bag to push the final medication through the line, in which she immediately felt a "bubbling" sensation to her chest. She expresses concern for air in the tubing. She then had coughing while in the bathroom after this, and once to her car developed chest pain and shortness of breath .  Currently with a lot less chest pain , significant less cough as well. No palpitations. Denies any previous similar with medciation adminisration in the past. No other URI symptoms. No fevers.    ROS per HPI, negative if not otherwise mentioned.      Past Medical History:  Diagnosis Date  . Abnormal Pap smear   . Herpes   . Hx of varicella   . Hypertension   . Multiple sclerosis (HCC)   . Vision abnormalities     Patient Active Problem List   Diagnosis Date Noted  . CTS (carpal tunnel syndrome) 01/23/2018  . Migraine without aura and without status migrainosus, not intractable 12/10/2017  . Vitamin D deficiency 12/10/2017  . Midline back pain 06/12/2017  . Neck pain 01/29/2017  . Insomnia 12/03/2016  . Depression with anxiety 12/03/2016  . Morbid obesity (HCC) 12/03/2016  . Other fatigue 12/03/2016  . Gait disturbance 09/03/2016  . High risk medication use 09/03/2016  . Essential hypertension   . Numbness   . Relapsing remitting multiple sclerosis (HCC) 08/20/2016  . Facial numbness 08/20/2016  . Vertigo 08/20/2016  . Headache 08/20/2016  . Double vision     Past Surgical History:  Procedure Laterality Date  . COLPOSCOPY    . WISDOM TOOTH EXTRACTION      OB  History    Gravida  3   Para  3   Term  3   Preterm      AB      Living  3     SAB      TAB      Ectopic      Multiple      Live Births  3            Home Medications    Prior to Admission medications   Medication Sig Start Date End Date Taking? Authorizing Provider  baclofen (LIORESAL) 20 MG tablet TAKE 3 TO 4 TABLETS BY MOUTH DAILY FOR SPASTICITY 06/23/19   Sater, Pearletha Furl, MD  buPROPion (WELLBUTRIN) 75 MG tablet Take 150 mg by mouth every morning. 04/26/19   [provider]  CALCIUM PO Take 1 Dose by mouth daily.    [provider]  cholecalciferol (VITAMIN D) 1000 units tablet Take 2,000 Units by mouth daily.    [provider]  hydrOXYzine (ATARAX/VISTARIL) 25 MG tablet Take 1 tablet by mouth as needed. 04/23/18   [provider]  hydrOXYzine (VISTARIL) 25 MG capsule Take 25-50 mg by mouth daily as needed. 04/26/19   [provider]  losartan-hydrochlorothiazide (HYZAAR) 100-25 MG tablet Take 1 tablet by mouth daily. 06/12/17   Sater, Pearletha Furl, MD  MedroxyPROGESTERone Acetate (DEPO-PROVERA  IM) Inject into the muscle every 3 (three) months.    [provider]  medroxyPROGESTERone Acetate 150 MG/ML SUSY  06/01/19   [provider]  natalizumab (TYSABRI) 300 MG/15ML injection Inject 300 mg into the vein every 30 (thirty) days.    [provider]  phentermine 15 MG capsule Take 1 capsule (15 mg total) by mouth every morning. 11/02/19   Sater, Pearletha Furl, MD  UNABLE TO FIND Vitality Multivitamin and Mineral    [provider]  UNABLE TO FIND Take 1 Dose by mouth daily. Med Name: Ogranic beet root    [provider]  zonisamide (ZONEGRAN) 100 MG capsule TAKE 1 TO 2 CAPSULES BY MOUTH NIGHTLY 06/23/19   Sater, Pearletha Furl, MD    Family History Family History  Problem Relation Age of Onset  . Hypertension Mother   . Diabetes Mother   . Diabetes Sister   . Other Paternal Uncle         Wiskott Aldnch syndrome, died as a child  . Healthy Father     Social History Social History   Tobacco Use  . Smoking status: Never Smoker  . Smokeless tobacco: Never Used  Vaping Use  . Vaping Use: Never used  Substance Use Topics  . Alcohol use: No  . Drug use: No     Allergies   Patient has no known allergies.   Review of Systems Review of Systems   Physical Exam Triage Vital Signs ED Triage Vitals  Enc Vitals Group     BP 02/16/20 1658 131/82     Pulse Rate 02/16/20 1658 96     Resp 02/16/20 1658 18     Temp 02/16/20 1658 97.7 F (36.5 C)     Temp Source 02/16/20 1658 Oral     SpO2 02/16/20 1658 97 %     Weight --      Height --      Head Circumference --      Peak Flow --      Pain Score 02/16/20 1659 0     Pain Loc --      Pain Edu? --      Excl. in GC? --    No data found.  Updated Vital Signs BP 134/80 (BP Location: Left Arm)   Pulse 73   Temp 97.7 F (36.5 C) (Oral)   Resp 17   LMP 02/02/2020   SpO2 99%    Physical Exam Constitutional:      General: She is not in acute distress.    Appearance: She is well-developed.  Cardiovascular:     Rate and Rhythm: Normal rate.     Heart sounds: Normal heart sounds.  Pulmonary:     Effort: Pulmonary effort is normal. No tachypnea.     Breath sounds: No decreased breath sounds.  Chest:     Chest wall: No tenderness.  Skin:    General: Skin is warm and dry.  Neurological:     Mental Status: She is alert and oriented to person, place, and time.    EKG:  NSR rate of 84.  No stwave changes as interpreted by me.    UC Treatments / Results  Labs (all labs ordered are listed, but only abnormal results are displayed) Labs Reviewed - No data to display  EKG   Radiology DG Chest 2 View  Result Date: 02/16/2020 CLINICAL DATA:  Cough, dyspnea, chest pain EXAM: CHEST - 2 VIEW COMPARISON:  None. FINDINGS: Normal heart  size. Normal mediastinal contour. No pneumothorax. No pleural effusion. Lungs  appear clear, with no acute consolidative airspace disease and no pulmonary edema. IMPRESSION: No active cardiopulmonary disease. Electronically Signed   By: Delbert Phenix M.D.   On: 02/16/2020 18:51    Procedures Procedures (including critical care time)  Medications Ordered in UC Medications - No data to display  Initial Impression / Assessment and Plan / UC Course  I have reviewed the triage vital signs and the nursing notes.  Pertinent labs & imaging results that were available during my care of the patient were reviewed by me and considered in my medical decision making (see chart for details).     ekg and chest xray normal. No work of breathing. Symptoms continued to dissipate. Hr and O2 normal here tonight. No indication of air embolus. Supportive cares recommended at this time with strict er precautions for any worsening of symptoms. Patient verbalized understanding and agreeable to plan.   Final Clinical Impressions(s) / UC Diagnoses   Final diagnoses:  Nonspecific chest pain     Discharge Instructions     Your vitals, chest xray, and ekg are very reassuring today.  Please follow up with your primary care provider as needed if recurrent, or go to the ER for any worsening of symptoms.     ED Prescriptions    None     PDMP not reviewed this encounter.   Georgetta Haber, NP 02/16/20 1929

## 2020-02-28 ENCOUNTER — Telehealth: Payer: Self-pay | Admitting: *Deleted

## 2020-02-28 NOTE — Telephone Encounter (Signed)
Faxed completed/signed Tysabri pt status report and reauth questionnaire to MS touch at [phone]. Received confirmation.   Received fax that pt is re-authorized from 02/28/20-09/29/20. Pt enrollment number: [id number]. Account:[employer]. Site auth number: [id number].

## 2020-03-07 ENCOUNTER — Telehealth: Payer: Self-pay | Admitting: Family Medicine

## 2020-03-07 ENCOUNTER — Encounter: Payer: Self-pay | Admitting: *Deleted

## 2020-03-07 NOTE — Telephone Encounter (Signed)
I called pt.  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 They are asking her to get a note stating that she has MS and is able to work.  ((apparently they have asked her to work 116hours in a 2 wk time frame and then wanted her to put in another 15hours.  She stated she could not do that, she was exhausted.  (understandbly so).  The reason for the letter now.  Ok to do?? 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  (also noted that she had urgent care visit for chest pain after her tysabri infusion (been getting tysabri since 2018).  She thought related to possible air bubbles when finishing her last infusion).  She checked out ok at urgent care.  (in Epic).

## 2020-03-07 NOTE — Telephone Encounter (Signed)
I spoke to pt and relayed that letter on the way by mychart.  Stay hydrated keep rested.  Up to her to work OT or not.  Pt was fine with letter as written.

## 2020-03-07 NOTE — Telephone Encounter (Signed)
Pt has called requesting a letter stating she has MS and is able to work, please call.

## 2020-03-07 NOTE — Telephone Encounter (Signed)
I am ok providing a letter stating that she is physically able to work from an MS standpoint. She should make sure she is getting plenty of rest (6-8 hours a night) and staying well hydrated. Whether she works overtime will be up to her. As for the chest pain, I will make note. I am reassured that her workup was ok at [facility]. We will continue to monitor closely.

## 2020-06-12 ENCOUNTER — Other Ambulatory Visit: Payer: Self-pay | Admitting: Neurology

## 2020-06-22 ENCOUNTER — Ambulatory Visit: Payer: 59 | Admitting: Neurology

## 2020-06-22 ENCOUNTER — Encounter: Payer: Self-pay | Admitting: Neurology

## 2020-06-22 ENCOUNTER — Telehealth: Payer: Self-pay | Admitting: *Deleted

## 2020-06-22 VITALS — BP 129/88 | HR 69 | Ht 65.0 in | Wt 190.5 lb

## 2020-06-22 DIAGNOSIS — G35 Multiple sclerosis: Secondary | ICD-10-CM

## 2020-06-22 DIAGNOSIS — Z79899 Other long term (current) drug therapy: Secondary | ICD-10-CM | POA: Diagnosis not present

## 2020-06-22 DIAGNOSIS — F988 Other specified behavioral and emotional disorders with onset usually occurring in childhood and adolescence: Secondary | ICD-10-CM

## 2020-06-22 DIAGNOSIS — R5383 Other fatigue: Secondary | ICD-10-CM | POA: Diagnosis not present

## 2020-06-22 MED ORDER — METHYLPHENIDATE HCL ER (OSM) 27 MG PO TBCR: 27.0000 mg | EXTENDED_RELEASE_TABLET | ORAL | 0 refills | Status: DC

## 2020-06-22 NOTE — Progress Notes (Signed)
[facility]  PATIENT: Amanda Horne DOB: 06-22-1985  REFERRING DOCTOR OR PCP:  Elfredia Nevins SOURCE: patient, notes from Dr. Sherwood Gambler, results on EMR, MRI images on PACS  _________________________________   HISTORICAL  CHIEF COMPLAINT:  Chief Complaint  Patient presents with  . Follow-up    RM 12, alone. Last seen 12/21/19 by AL,NP. On Tysbari for MS. Last infusion: 06/15/20. Next infusion not scheduled yet, pending approval via insurance. Appeal letter submitted 06/21/20, waiting on determination.  Last JCV 12/21/19 negative, index: 0.37    HISTORY OF PRESENT ILLNESS:  Amanda Horne Is a 35 y.o. woman who was diagnosed with relapsing remimultiple sclerosis in April 2018   Update 06/22/2020: She is on Tysabri and tolerates it well.  She has been JCV Ab negative.    She feels her MS is stable.  No exacerbations or new symptoms.  Gait is about the same.    She can easily walk 3 miles without stopping.  Balance is now fine.   She denies falls.    She does need the bannister on stairs.   She denies weakness.    She has no numbness.   She has no issues with her bladder.   Vision is fine with each eye but she notes dysconjugate eyes at times (was worse initially in 2018)  She has fatigue helped by phentermine more than armodafinil.   She went to 15 mg from 37.5 due to increased BP but can take 2 x 15 mg with better benefit than just 1 pill. We discussed trying Concerta instead.   She sleeps well most nights.   She feels mood and cognition are doing well.      She had Covid despite the vaccination but had mild symptoms.       MS History:    In late March, 2018, she had severe vomiting and dizziness.   She went to the ED and was given fluids and Zofran.   A couple days later, she noes the right eye wandered to the right and the left eye had jerking.   She also had right hand numbness and slurred speech.     She went back to the ED 08/20/2016 and MRI was worrisome for MS.    She was admitted and received 5 days of IV Solu-Medrol.     She improved but not to baseline.   In retrospect, in 2016,  she had phasic muscle spasms associated with weakness on her right side lasting 30-90 seconds.    She started Tysabri 09/2016.    IMAGING MRI of the brain dated 08/20/2016 shows in the posterior fossa there are right cerebellar focus adjacent to the fourth ventricle and a left posterior pontomedullary focus.   There are many T2/FLAIR hyperintense foci or ventricular, juxtacortical and deep white matter consistent with MS. 2 foci, one in the right temporal region and one in the left frontal region enhance after contrast.  MRI brain 11/10/2019 showed T2/FLAIR hyperintense foci, predominantly in the periventricular white matter but also in the juxtacortical and deep white matter of the hemispheres and in the thalamus, pons and spinal cord.  None of the foci appear to be acute.  They do not enhance.  Compared to the MRI dated 06/29/2018, there are no new lesions.     Chronic maxillary and ethmoid sinusitis.  REVIEW OF SYSTEMS: Constitutional: No fevers, chills, sweats, or change in appetite Eyes: No visual changes, double vision, eye pain Ear, nose and throat: No hearing  loss, ear pain, nasal congestion, sore throat Cardiovascular: No chest pain, palpitations Respiratory: No shortness of breath at rest or with exertion.   No wheezes GastrointestinaI: No nausea, vomiting, diarrhea, abdominal pain, fecal incontinence Genitourinary: No dysuria, urinary retention or frequency.  No nocturia. Musculoskeletal: No neck pain, back pain Integumentary: No rash, pruritus, skin lesions Neurological: as above Psychiatric: No depression at this time.  No anxiety Endocrine: No palpitations, diaphoresis, change in appetite, change in weigh or increased thirst Hematologic/Lymphatic: No anemia, purpura, petechiae. Allergic/Immunologic: No itchy/runny eyes, nasal congestion, recent allergic  reactions, rashes  ALLERGIES: No Known Allergies  HOME MEDICATIONS:  Current Outpatient Medications:  .  baclofen (LIORESAL) 20 MG tablet, TAKE 3 TO 4 TABLETS BY MOUTH DAILY FOR SPASTICITY, Disp: 360 tablet, Rfl: 3 .  buPROPion (WELLBUTRIN) 75 MG tablet, Take 150 mg by mouth every morning., Disp: , Rfl:  .  CALCIUM PO, Take 1 Dose by mouth daily., Disp: , Rfl:  .  cholecalciferol (VITAMIN D) 1000 units tablet, Take 2,000 Units by mouth daily., Disp: , Rfl:  .  hydrOXYzine (ATARAX/VISTARIL) 25 MG tablet, Take 1 tablet by mouth as needed., Disp: , Rfl:  .  hydrOXYzine (VISTARIL) 25 MG capsule, Take 25-50 mg by mouth daily as needed., Disp: , Rfl:  .  losartan-hydrochlorothiazide (HYZAAR) 100-25 MG tablet, Take 1 tablet by mouth daily., Disp: 30 tablet, Rfl: 11 .  medroxyPROGESTERone Acetate 150 MG/ML SUSY, , Disp: , Rfl:  .  methylphenidate (CONCERTA) 27 MG PO CR tablet, Take 1 tablet (27 mg total) by mouth every morning., Disp: 30 tablet, Rfl: 0 .  natalizumab (TYSABRI) 300 MG/15ML injection, Inject 300 mg into the vein every 30 (thirty) days., Disp: , Rfl:  .  UNABLE TO FIND, Vitality Multivitamin and Mineral, Disp: , Rfl:  .  UNABLE TO FIND, Take 1 Dose by mouth daily. Med Name: Ogranic beet root, Disp: , Rfl:  .  zonisamide (ZONEGRAN) 100 MG capsule, TAKE 1 TO 2 CAPSULES BY MOUTH NIGHTLY, Disp: 180 capsule, Rfl: 4  PAST MEDICAL HISTORY: Past Medical History:  Diagnosis Date  . Abnormal Pap smear   . Herpes   . Hx of varicella   . Hypertension   . Multiple sclerosis (HCC)   . Vision abnormalities     PAST SURGICAL HISTORY: Past Surgical History:  Procedure Laterality Date  . COLPOSCOPY    . WISDOM TOOTH EXTRACTION      FAMILY HISTORY: Family History  Problem Relation Age of Onset  . Hypertension Mother   . Diabetes Mother   . Diabetes Sister   . Other Paternal Uncle        Wiskott Aldnch syndrome, died as a child  . Healthy Father     SOCIAL HISTORY:  Social  History   Socioeconomic History  . Marital status: Single    Spouse name: Not on file  . Number of children: Not on file  . Years of education: Not on file  . Highest education level: Not on file  Occupational History  . Not on file  Tobacco Use  . Smoking status: Never Smoker  . Smokeless tobacco: Never Used  Vaping Use  . Vaping Use: Never used  Substance and Sexual Activity  . Alcohol use: No  . Drug use: No  . Sexual activity: Not on file  Other Topics Concern  . Not on file  Social History Narrative  . Not on file   Social Determinants of Health   Financial Resource Strain: Not  on file  Food Insecurity: Not on file  Transportation Needs: Not on file  Physical Activity: Not on file  Stress: Not on file  Social Connections: Not on file  Intimate Partner Violence: Not on file     PHYSICAL EXAM  Vitals:   06/22/20 1254  BP: 129/88  Pulse: 69  Weight: 190 lb 8 oz (86.4 kg)  Height: 5\' 5"  (1.651 m)    Body mass index is 31.7 kg/m.   General: The patient is well-developed and well-nourished and in no acute distress.  Neck is non-tender and she has a good ROM.   Neurologic Exam  Mental status: The patient is alert and oriented x 3 at the time of the examination. The patient has apparent normal recent and remote memory, with an apparently normal attention span and concentration ability.   Speech is normal.  Cranial nerves: Extraocular movements are full.  Facial strength is normal.  Trapezius strength is normal.. No obvious hearing deficits are noted.  Motor:  Muscle bulk is normal.  Muscle tone was normal in the arms and legs today.  Strength is 5/5   Sensory: She has normal sensation to touch and vibration in the arms and legs.  Coordination: She has good finger-nose-finger and heel-to-shin  Gait and station: Station is normal.  Gait is normal.  Tandem gait is very minimally wide.  The Romberg sign is negative.     Reflexes: Deep tendon reflexes are  increased in the legs with spread at the knees.  The ankle reflexes are increased.  There is no clonus.      DIAGNOSTIC DATA (LABS, IMAGING, TESTING) - I reviewed patient records, labs, notes, testing and imaging myself where available.  Lab Results  Component Value Date   WBC 11.2 (H) 12/21/2019   HGB 12.0 12/21/2019   HCT 35.9 12/21/2019   MCV 74 (L) 12/21/2019   PLT 246 12/21/2019      Component Value Date/Time   NA 141 12/21/2019 1344   K 3.8 12/21/2019 1344   CL 106 12/21/2019 1344   CO2 22 12/21/2019 1344   GLUCOSE 87 12/21/2019 1344   GLUCOSE 109 (H) 08/24/2016 0523   BUN 14 12/21/2019 1344   CREATININE 0.74 12/21/2019 1344   CALCIUM 9.3 12/21/2019 1344   PROT 6.8 12/21/2019 1344   ALBUMIN 4.3 12/21/2019 1344   AST 26 12/21/2019 1344   ALT 32 12/21/2019 1344   ALKPHOS 89 12/21/2019 1344   BILITOT 0.3 12/21/2019 1344   GFRNONAA 105 12/21/2019 1344   GFRAA 121 12/21/2019 1344    Lab Results  Component Value Date   VITAMINB12 1,718 (H) 08/21/2016        ASSESSMENT AND PLAN  1. Relapsing remitting multiple sclerosis (HCC)   2. High risk medication use   3. Other fatigue   4. Attention deficit disorder (ADD) without hyperactivity     1.    Continue Tysabri 300 g every 4 weeks.  We will check the JCV antibody and a CBC with differential today.    If she converts to JCV Ab positive, we will switch to Ocrevus or other med.     We will check an MRI of the brain around the time of her next visit to determine if there is any subclinical progression and also consider a change in therapy if this is occurring. 2.   Stay active and exercise as tolerated. 3.   Continue zonisamide for migraine. 4.   Change phentermine to Concerta for fatigue  and MS related ADD  RTC 6 months or sooner if there are new or worsening neurologic symptoms   Sorrel Cassetta A. Epimenio Foot, MD, PhD, FAAN Certified in Neurology, Clinical Neurophysiology, Sleep Medicine, Pain Medicine and  Neuroimaging Director, [facility]  [facility] [address] [place], [place] [address] [phone]

## 2020-06-22 NOTE — Telephone Encounter (Signed)
Placed JCV lab in [facility] lock box for routine lab pick up. Results pending. 

## 2020-06-23 LAB — CBC WITH DIFFERENTIAL/PLATELET
Basophils Absolute: 0.1 10*3/uL (ref 0.0–0.2)
Basos: 0 %
EOS (ABSOLUTE): 0.3 10*3/uL (ref 0.0–0.4)
Eos: 3 %
Hematocrit: 36.9 % (ref 34.0–46.6)
Hemoglobin: 11.7 g/dL (ref 11.1–15.9)
Immature Grans (Abs): 0 10*3/uL (ref 0.0–0.1)
Immature Granulocytes: 0 %
Lymphocytes Absolute: 4.7 10*3/uL — ABNORMAL HIGH (ref 0.7–3.1)
Lymphs: 41 %
MCH: 24.4 pg — ABNORMAL LOW (ref 26.6–33.0)
MCHC: 31.7 g/dL (ref 31.5–35.7)
MCV: 77 fL — ABNORMAL LOW (ref 79–97)
Monocytes Absolute: 0.7 10*3/uL (ref 0.1–0.9)
Monocytes: 6 %
Neutrophils Absolute: 5.7 10*3/uL (ref 1.4–7.0)
Neutrophils: 50 %
Platelets: 280 10*3/uL (ref 150–450)
RBC: 4.79 x10E6/uL (ref 3.77–5.28)
RDW: 15.7 % — ABNORMAL HIGH (ref 11.7–15.4)
WBC: 11.5 10*3/uL — ABNORMAL HIGH (ref 3.4–10.8)

## 2020-06-26 ENCOUNTER — Telehealth: Payer: Self-pay | Admitting: *Deleted

## 2020-06-26 NOTE — Telephone Encounter (Signed)
Received fax from [employer] that appeal for Amanda Horne is approved. Case number: [id number].Gave to [employer] for their records.

## 2020-06-29 NOTE — Telephone Encounter (Signed)
JCV ab drawn on 06/22/20 indeterminate, index: 0.32. Inhibition assay: negative

## 2020-07-09 ENCOUNTER — Other Ambulatory Visit: Payer: Self-pay | Admitting: Neurology

## 2020-07-27 ENCOUNTER — Telehealth: Payer: Self-pay | Admitting: Neurology

## 2020-07-27 MED ORDER — METHYLPHENIDATE HCL ER (OSM) 36 MG PO TBCR: 36.0000 mg | EXTENDED_RELEASE_TABLET | ORAL | 0 refills | Status: DC

## 2020-07-27 NOTE — Telephone Encounter (Signed)
Pt called stating that she is needing a higher dose of her methylphenidate (CONCERTA) 27 MG PO CR tablet  Please advise.

## 2020-07-27 NOTE — Telephone Encounter (Signed)
Called pt to further discuss. Still tired on current dose of methylphenidate 27mg  po q am. Has taken this dose for about a month. She would like to try next dose higher, she had previously spoke with MD about doing this if current dose did not help completely. Advised I will send request to MD.  Checked drug registry. She last refilled methylphenidate 27mg  06/22/20 #30. Last seen 06/22/20 and next f/u 12/21/20.

## 2020-07-31 NOTE — Telephone Encounter (Addendum)
Received fax from [facility] that Concerta 36mg  on back order/no release date. Spoke with MD. He states he can call in alternative: Ritalin 20mg  po BID instead. I called pharmacy and spoke with Tamika. Confirmed they have this alternative in stock. Advised MD will e-scribe rx.   I called pt to see if she was agreeable to plan. She states she actually was able to fill generic instead of brand name using good rx. She will continue filling w/ goodrx for now. She will call back if she feels medications is not ineffective moving forward.

## 2020-09-04 ENCOUNTER — Telehealth: Payer: Self-pay | Admitting: Neurology

## 2020-09-04 MED ORDER — METHYLPHENIDATE HCL ER (OSM) 36 MG PO TBCR: 36.0000 mg | EXTENDED_RELEASE_TABLET | ORAL | 0 refills | Status: DC

## 2020-09-04 NOTE — Addendum Note (Signed)
Addended by: Arther Abbott on: 09/04/2020 01:12 PM   Modules accepted: Orders

## 2020-09-04 NOTE — Addendum Note (Signed)
Addended by: Levert Feinstein on: 09/04/2020 04:24 PM   Modules accepted: Orders

## 2020-09-04 NOTE — Telephone Encounter (Signed)
Meds ordered this encounter  Medications  . methylphenidate 36 MG PO CR tablet    Sig: Take 1 tablet (36 mg total) by mouth every morning.    Dispense:  30 tablet    Refill:  0    She will use goodrx coupon. Dr. Epimenio Foot out, Dr. Terrace Arabia covering MD.

## 2020-09-04 NOTE — Telephone Encounter (Signed)
PT . Calling about Concerto RX  still on Back order .  Saw message Kara Mead Can she do Ritalin ?    IF so [employer]   .

## 2020-09-04 NOTE — Telephone Encounter (Signed)
Called pt back. She just needs refill on generic Concerta (methylphenidate). Advised Dr. Epimenio Foot out, I will send to covering MD for her. Last seen 06/22/20 and next follow up 12/21/2020. Checked drug registry. She last refilled 07/28/20 #30.

## 2020-09-21 ENCOUNTER — Telehealth: Payer: Self-pay | Admitting: Neurology

## 2020-09-21 ENCOUNTER — Other Ambulatory Visit: Payer: Self-pay | Admitting: *Deleted

## 2020-09-21 MED ORDER — ZONISAMIDE 100 MG PO CAPS: ORAL_CAPSULE | ORAL | 4 refills | Status: DC

## 2020-09-21 NOTE — Telephone Encounter (Signed)
Pt called wanting to know if a refill on her zonisamide (ZONEGRAN) 100 MG capsule can be sent in to the [facility] in [place]. Please advise.

## 2020-09-21 NOTE — Telephone Encounter (Signed)
LVM for pt letting her know I received message and will send in refill for her to [facility]. I e-scribed rx.

## 2020-10-26 ENCOUNTER — Other Ambulatory Visit: Payer: Self-pay | Admitting: Neurology

## 2020-10-26 ENCOUNTER — Other Ambulatory Visit: Payer: Self-pay | Admitting: *Deleted

## 2020-10-26 ENCOUNTER — Telehealth: Payer: Self-pay | Admitting: *Deleted

## 2020-10-26 DIAGNOSIS — G35 Multiple sclerosis: Secondary | ICD-10-CM

## 2020-10-26 DIAGNOSIS — Z79899 Other long term (current) drug therapy: Secondary | ICD-10-CM

## 2020-10-26 NOTE — Telephone Encounter (Signed)
Placed JCV lab in [facility] lock box for routine lab pick up. Results pending. 

## 2020-10-27 LAB — CBC WITH DIFFERENTIAL/PLATELET
Basophils Absolute: 0 10*3/uL (ref 0.0–0.2)
Basos: 0 %
EOS (ABSOLUTE): 0.2 10*3/uL (ref 0.0–0.4)
Eos: 2 %
Hematocrit: 35.7 % (ref 34.0–46.6)
Hemoglobin: 11.4 g/dL (ref 11.1–15.9)
Immature Grans (Abs): 0 10*3/uL (ref 0.0–0.1)
Immature Granulocytes: 0 %
Lymphocytes Absolute: 3.4 10*3/uL — ABNORMAL HIGH (ref 0.7–3.1)
Lymphs: 37 %
MCH: 24 pg — ABNORMAL LOW (ref 26.6–33.0)
MCHC: 31.9 g/dL (ref 31.5–35.7)
MCV: 75 fL — ABNORMAL LOW (ref 79–97)
Monocytes Absolute: 0.7 10*3/uL (ref 0.1–0.9)
Monocytes: 7 %
Neutrophils Absolute: 4.9 10*3/uL (ref 1.4–7.0)
Neutrophils: 54 %
Platelets: 215 10*3/uL (ref 150–450)
RBC: 4.75 x10E6/uL (ref 3.77–5.28)
RDW: 15.1 % (ref 11.7–15.4)
WBC: 9.3 10*3/uL (ref 3.4–10.8)

## 2020-11-02 NOTE — Telephone Encounter (Signed)
JCV ab drawn on 10/26/20 indeterminate, index: 0.28. Inhibition assay: negative.

## 2020-12-21 ENCOUNTER — Encounter: Payer: Self-pay | Admitting: Neurology

## 2020-12-21 ENCOUNTER — Ambulatory Visit: Payer: 59 | Admitting: Neurology

## 2020-12-21 VITALS — BP 122/80 | HR 69 | Ht 64.0 in | Wt 183.0 lb

## 2020-12-21 DIAGNOSIS — G43009 Migraine without aura, not intractable, without status migrainosus: Secondary | ICD-10-CM | POA: Diagnosis not present

## 2020-12-21 DIAGNOSIS — R5383 Other fatigue: Secondary | ICD-10-CM

## 2020-12-21 DIAGNOSIS — Z79899 Other long term (current) drug therapy: Secondary | ICD-10-CM

## 2020-12-21 DIAGNOSIS — G35 Multiple sclerosis: Secondary | ICD-10-CM | POA: Diagnosis not present

## 2020-12-21 DIAGNOSIS — F988 Other specified behavioral and emotional disorders with onset usually occurring in childhood and adolescence: Secondary | ICD-10-CM

## 2020-12-21 MED ORDER — BACLOFEN 20 MG PO TABS: ORAL_TABLET | ORAL | 3 refills | Status: DC

## 2020-12-21 MED ORDER — ALPRAZOLAM 0.5 MG PO TABS: ORAL_TABLET | ORAL | 0 refills | Status: DC

## 2020-12-21 NOTE — Progress Notes (Signed)
[facility]  PATIENT: Amanda Horne DOB: 08-23-1985  REFERRING DOCTOR OR PCP:  Elfredia Nevins SOURCE: patient, notes from Dr. Sherwood Gambler, results on EMR, MRI images on PACS  _________________________________   HISTORICAL  CHIEF COMPLAINT:  Chief Complaint  Patient presents with   Follow-up    Rm 1, alone. Here for 6 month MS f/u, on Tysabri. Pt reports being stable, no new or worsening sx. She has stopped taking concerta since starting fluoxetine.     HISTORY OF PRESENT ILLNESS:  Amanda Horne Is a 35 y.o. 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  She tolerates it well.  She has been JCV Ab negative )0.28 tested 10/26/2020).    She feels her MS is stable.  No exacerbations or new symptoms.  Gait is about the same.    She can easily walk 3 miles without stopping.  Balance is now fine.   She denies falls.   She can go downstairs without the bannister on stairs.     She denies weakness but has some spasticity in legs.  She has no numbness.   She has no issues with her bladder.   Vision is fine with each eye but she notes dysconjugate eyes at times (was worse initially in 2018)  She has fatigue.  She stopped the stimulants as she felt better on Prozac alone.    She sleeps well most nights.   She feels mood and cognition are doing well.       She is trying to do some diet changes and lost some weight.     She has done the booster for Covid and had it in 2021.      MS History:    In late March, 2018, she had severe vomiting and dizziness.   She went to the ED and was given fluids and Zofran.   A couple days later, she noes the right eye wandered to the right and the left eye had jerking.   She also had right hand numbness and slurred speech.     She went back to the ED 08/20/2016 and MRI was worrisome for MS.   She was admitted and received 5 days of IV Solu-Medrol.     She improved but not to baseline.    In retrospect, in 2016,  she had phasic muscle spasms associated with weakness on her right side lasting 30-90 seconds.    She started Tysabri 09/2016.    IMAGING MRI of the brain dated 08/20/2016 shows in the posterior fossa there are right cerebellar focus adjacent to the fourth ventricle and a left posterior pontomedullary focus.   There are many T2/FLAIR hyperintense foci or ventricular, juxtacortical and deep white matter consistent with MS. 2 foci, one in the right temporal region and one in the left frontal region enhance after contrast.  MRI brain 11/10/2019 showed T2/FLAIR hyperintense foci, predominantly in the periventricular white matter but also in the juxtacortical and deep white matter of the hemispheres and in the thalamus, pons and spinal cord.  None of the foci appear to be acute.  They do not enhance.  Compared to the MRI dated 06/29/2018, there are no new lesions.     Chronic maxillary and ethmoid sinusitis.  REVIEW OF SYSTEMS: Constitutional: No fevers, chills, sweats, or change in appetite Eyes: No visual changes, double vision, eye pain Ear, nose and throat: No hearing loss, ear pain, nasal  congestion, sore throat Cardiovascular: No chest pain, palpitations Respiratory:  No shortness of breath at rest or with exertion.   No wheezes GastrointestinaI: No nausea, vomiting, diarrhea, abdominal pain, fecal incontinence Genitourinary:  No dysuria, urinary retention or frequency.  No nocturia. Musculoskeletal:  No neck pain, back pain Integumentary: No rash, pruritus, skin lesions Neurological: as above Psychiatric: No depression at this time.  No anxiety Endocrine: No palpitations, diaphoresis, change in appetite, change in weigh or increased thirst Hematologic/Lymphatic:  No anemia, purpura, petechiae. Allergic/Immunologic: No itchy/runny eyes, nasal congestion, recent allergic reactions, rashes  ALLERGIES: No Known Allergies  HOME MEDICATIONS:  Current Outpatient  Medications:    ALPRAZolam (XANAX) 0.5 MG tablet, Take 1-2 pills before the MRi, Disp: 2 tablet, Rfl: 0   CALCIUM PO, Take 1 Dose by mouth daily., Disp: , Rfl:    cholecalciferol (VITAMIN D) 1000 units tablet, Take 2,000 Units by mouth daily., Disp: , Rfl:    FLUoxetine (PROZAC) 10 MG capsule, Take 10 mg by mouth daily., Disp: , Rfl:    hydrOXYzine (ATARAX/VISTARIL) 25 MG tablet, Take 1 tablet by mouth as needed., Disp: , Rfl:    hydrOXYzine (VISTARIL) 25 MG capsule, Take 25-50 mg by mouth daily as needed., Disp: , Rfl:    medroxyPROGESTERone Acetate 150 MG/ML SUSY, , Disp: , Rfl:    natalizumab (TYSABRI) 300 MG/15ML injection, Inject 300 mg into the vein every 30 (thirty) days., Disp: , Rfl:    UNABLE TO FIND, Vitality Multivitamin and Mineral, Disp: , Rfl:    UNABLE TO FIND, Take 1 Dose by mouth daily. Med Name: Ogranic beet root, Disp: , Rfl:    zonisamide (ZONEGRAN) 100 MG capsule, TAKE 1 TO 2 CAPSULES BY  MOUTH AT NIGHT, Disp: 180 capsule, Rfl: 4   ALPRAZolam (XANAX) 0.5 MG tablet, Take 1-2 tablets 30 min. prior to MRI.  You must not drink alcohol, drive or operate dangerous equipment after taking this medication., Disp: 2 tablet, Rfl: 0   baclofen (LIORESAL) 20 MG tablet, TAKE 3 TO 4 TABLETS BY MOUTH DAILY FOR SPASTICITY, Disp: 360 tablet, Rfl: 3  PAST MEDICAL HISTORY: Past Medical History:  Diagnosis Date   Abnormal Pap smear    Herpes    Hx of varicella    Hypertension    Multiple sclerosis (HCC)    Vision abnormalities     PAST SURGICAL HISTORY: Past Surgical History:  Procedure Laterality Date   COLPOSCOPY     WISDOM TOOTH EXTRACTION      FAMILY HISTORY: Family History  Problem Relation Age of Onset   Hypertension Mother    Diabetes Mother    Diabetes Sister    Other Paternal Uncle        Wiskott Aldnch syndrome, died as a child   Healthy Father     SOCIAL HISTORY:  Social History   Socioeconomic History   Marital status: Single    Spouse name: Not on file    Number of children: Not on file   Years of education: Not on file   Highest education level: Not on file  Occupational History   Not on file  Tobacco Use   Smoking status: Never   Smokeless tobacco: Never  Vaping Use   Vaping Use: Never used  Substance and Sexual Activity   Alcohol use: No   Drug use: No   Sexual activity: Not on file  Other Topics Concern   Not on file  Social History Narrative   Not on file  Social Determinants of Health   Financial Resource Strain: Not on file  Food Insecurity: Not on file  Transportation Needs: Not on file  Physical Activity: Not on file  Stress: Not on file  Social Connections: Not on file  Intimate Partner Violence: Not on file     PHYSICAL EXAM  Vitals:   12/21/20 1310  BP: 122/80  Pulse: 69  Weight: 183 lb (83 kg)  Height: 5\' 4"  (1.626 m)    Body mass index is 31.41 kg/m.   General: The patient is well-developed and well-nourished and in no acute distress.  Neck is non-tender and she has a good ROM.   Neurologic Exam  Mental status: The patient is alert and oriented x 3 at the time of the examination. The patient has apparent normal recent and remote memory, with an apparently normal attention span and concentration ability.   Speech is normal.  Cranial nerves: Extraocular movements are full.  Facial strength is normal.  Trapezius strength is normal.. No obvious hearing deficits are noted.  Motor:  Muscle bulk is normal.  Muscle tone was normal in the arms and legs today.  Strength is 5/5 Sensory: She has normal sensation to touch and vibration in the arms and legs.  Coordination: She has good finger-nose-finger and heel-to-shin  Gait and station: Station is normal.  Gait is normal.  Tandem gait is very minimally wide.  The Romberg sign is negative.     Reflexes: Deep tendon reflexes are increased in the legs with spread at the knees.  The ankle reflexes are increased.  There is no clonus.      DIAGNOSTIC  DATA (LABS, IMAGING, TESTING) - I reviewed patient records, labs, notes, testing and imaging myself where available.  Lab Results  Component Value Date   WBC 9.3 10/26/2020   HGB 11.4 10/26/2020   HCT 35.7 10/26/2020   MCV 75 (L) 10/26/2020   PLT 215 10/26/2020      Component Value Date/Time   NA 141 12/21/2019 1344   K 3.8 12/21/2019 1344   CL 106 12/21/2019 1344   CO2 22 12/21/2019 1344   GLUCOSE 87 12/21/2019 1344   GLUCOSE 109 (H) 08/24/2016 0523   BUN 14 12/21/2019 1344   CREATININE 0.74 12/21/2019 1344   CALCIUM 9.3 12/21/2019 1344   PROT 6.8 12/21/2019 1344   ALBUMIN 4.3 12/21/2019 1344   AST 26 12/21/2019 1344   ALT 32 12/21/2019 1344   ALKPHOS 89 12/21/2019 1344   BILITOT 0.3 12/21/2019 1344   GFRNONAA 105 12/21/2019 1344   GFRAA 121 12/21/2019 1344    Lab Results  Component Value Date   VITAMINB12 1,718 (H) 08/21/2016        ASSESSMENT AND PLAN  1. Relapsing remitting multiple sclerosis (HCC)   2. High risk medication use   3. Other fatigue   4. Migraine without aura and without status migrainosus, not intractable   5. Attention deficit disorder (ADD) without hyperactivity      1.    Continue Tysabri 300 g every 4 weeks.  Check the JCV antibody about every 6 months.   We will check an MRI of the brain  to determine if there is any subclinical progression and also consider a change in therapy if this is occurring. 2.   Stay active and exercise as tolerated. 3.   Continue zonisamide for migraine.  Continue baclofen for spasticity. 4.    RTC 6 months or sooner if there are new or worsening neurologic  symptoms   Deshawna Mcneece A. Epimenio Foot, MD, PhD, FAAN Certified in Neurology, Clinical Neurophysiology, Sleep Medicine, Pain Medicine and Neuroimaging Director, [facility] at [facility]  [facility] [address] [place], [place] [address] [phone]

## 2020-12-31 ENCOUNTER — Ambulatory Visit
Admission: RE | Admit: 2020-12-31 | Discharge: 2020-12-31 | Disposition: A | Discharge status: Home / Self Care | Payer: 59 | Source: Ambulatory Visit | Attending: Neurology | Admitting: Neurology

## 2020-12-31 DIAGNOSIS — G35 Multiple sclerosis: Secondary | ICD-10-CM

## 2021-01-29 ENCOUNTER — Other Ambulatory Visit: Payer: Self-pay | Admitting: Neurology

## 2021-01-29 ENCOUNTER — Telehealth: Payer: Self-pay | Admitting: Neurology

## 2021-01-29 MED ORDER — METHYLPREDNISOLONE 4 MG PO TABS: ORAL_TABLET | ORAL | 0 refills | Status: DC

## 2021-01-29 NOTE — Telephone Encounter (Signed)
Pt states since Saturday she has had sever back pain, so bad it was difficult to walk. Pt is asking for a call to discuss if it is MS related.

## 2021-01-29 NOTE — Telephone Encounter (Signed)
Patient reports the pain came on suddenly starting Saturday, no injury. She feels the pain across her lower back, it's painful to walk or get up from a sitting position. She denies any urinary symptoms or fever. She says she's doing a little better today than she was on Saturday, but she's not sure if that's just because she's tolerating the pain better. Could this be an MS flare up or something else? And what would you recommend?

## 2021-01-29 NOTE — Telephone Encounter (Signed)
I spoke with patient and made her aware of Dr. Bonnita Hollow recommendations. She voiced appreciation and is aware to call us back if symptoms no better after a few days.

## 2021-01-29 NOTE — Telephone Encounter (Signed)
Rx was sent to pharmacy. 

## 2021-02-05 ENCOUNTER — Telehealth: Payer: Self-pay | Admitting: Neurology

## 2021-02-05 NOTE — Telephone Encounter (Signed)
Pt has called to report that the back pain is some better but she still has some pain.  Pt states on Wed of this week she is leaving on a cruise and wants to know if a temporary handicap placard could be assigned to her to  eliminate a lot of walking.  Please call.

## 2021-02-05 NOTE — Telephone Encounter (Signed)
Called the patient and advised the handicap placard will be completed and placed at the front for pick up. Pt will come to pick up. He recommends the patient stay around the cloth with the otc aleve/ibuprofen. Advised to keep [place] posted if anything else is needed Pt verbalized understanding.

## 2021-02-15 NOTE — Telephone Encounter (Signed)
Error

## 2021-05-02 ENCOUNTER — Other Ambulatory Visit (INDEPENDENT_AMBULATORY_CARE_PROVIDER_SITE_OTHER): Payer: Self-pay

## 2021-05-02 ENCOUNTER — Other Ambulatory Visit: Payer: Self-pay | Admitting: *Deleted

## 2021-05-02 ENCOUNTER — Telehealth: Payer: Self-pay | Admitting: *Deleted

## 2021-05-02 DIAGNOSIS — Z79899 Other long term (current) drug therapy: Secondary | ICD-10-CM

## 2021-05-02 DIAGNOSIS — Z0289 Encounter for other administrative examinations: Secondary | ICD-10-CM

## 2021-05-02 DIAGNOSIS — G35 Multiple sclerosis: Secondary | ICD-10-CM

## 2021-05-02 NOTE — Telephone Encounter (Signed)
Placed JCV lab in [facility] lock box for routine lab pick up. Results pending. 

## 2021-05-03 LAB — CBC WITH DIFFERENTIAL/PLATELET
Basophils Absolute: 0 10*3/uL (ref 0.0–0.2)
Basos: 0 %
EOS (ABSOLUTE): 0.2 10*3/uL (ref 0.0–0.4)
Eos: 2 %
Hematocrit: 34.1 % (ref 34.0–46.6)
Hemoglobin: 11.2 g/dL (ref 11.1–15.9)
Immature Grans (Abs): 0 10*3/uL (ref 0.0–0.1)
Immature Granulocytes: 0 %
Lymphocytes Absolute: 3.3 10*3/uL — ABNORMAL HIGH (ref 0.7–3.1)
Lymphs: 38 %
MCH: 23.9 pg — ABNORMAL LOW (ref 26.6–33.0)
MCHC: 32.8 g/dL (ref 31.5–35.7)
MCV: 73 fL — ABNORMAL LOW (ref 79–97)
Monocytes Absolute: 0.6 10*3/uL (ref 0.1–0.9)
Monocytes: 7 %
Neutrophils Absolute: 4.6 10*3/uL (ref 1.4–7.0)
Neutrophils: 53 %
Platelets: 261 10*3/uL (ref 150–450)
RBC: 4.68 x10E6/uL (ref 3.77–5.28)
RDW: 15.6 % — ABNORMAL HIGH (ref 11.7–15.4)
WBC: 8.7 10*3/uL (ref 3.4–10.8)

## 2021-05-15 NOTE — Telephone Encounter (Signed)
JCV ab drawn on 05/02/21 indeterminate, index: 0.34. Inhibition assay: negative.

## 2021-06-27 ENCOUNTER — Ambulatory Visit: Payer: Self-pay | Admitting: Family Medicine

## 2021-08-07 IMAGING — DX DG CHEST 2V
2 series · 2 of 2 positions shown · non-contrast
Comparison: None.

CLINICAL DATA: Cough, dyspnea, chest pain

EXAM:
CHEST - 2 VIEW

[chest pa]
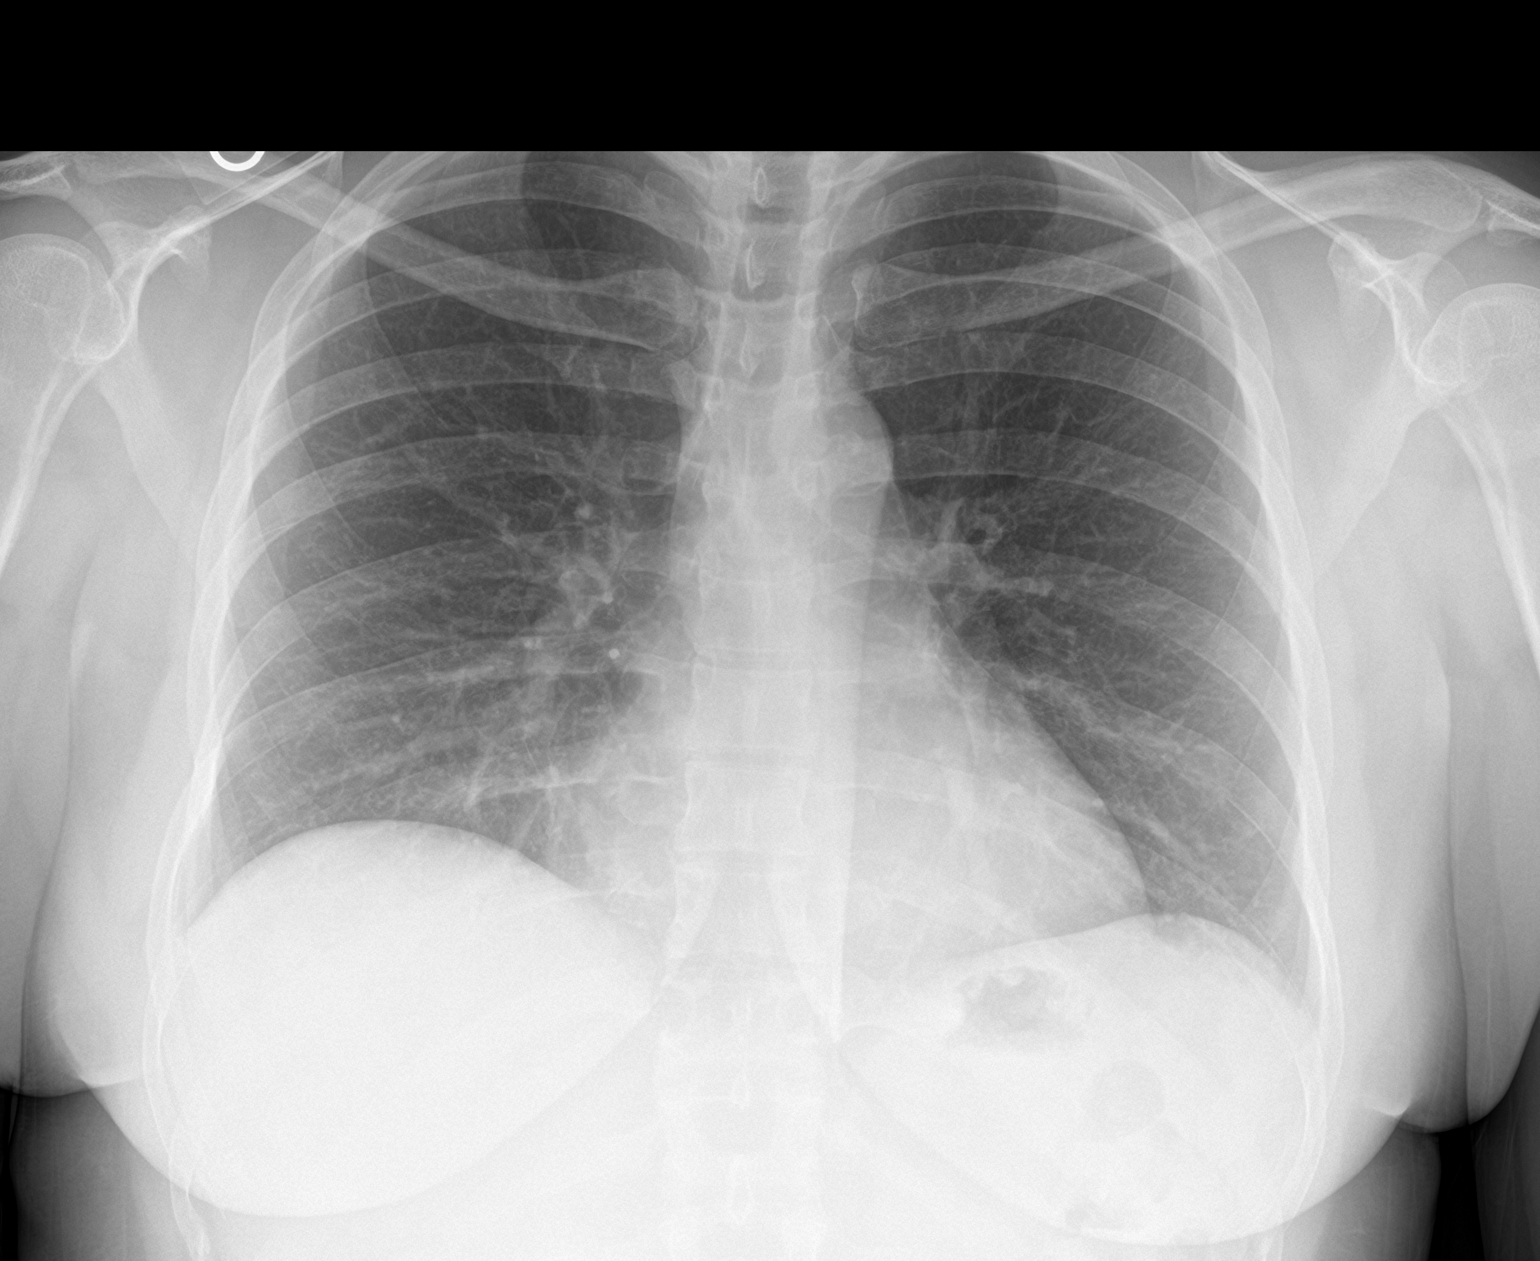

[chest lat]
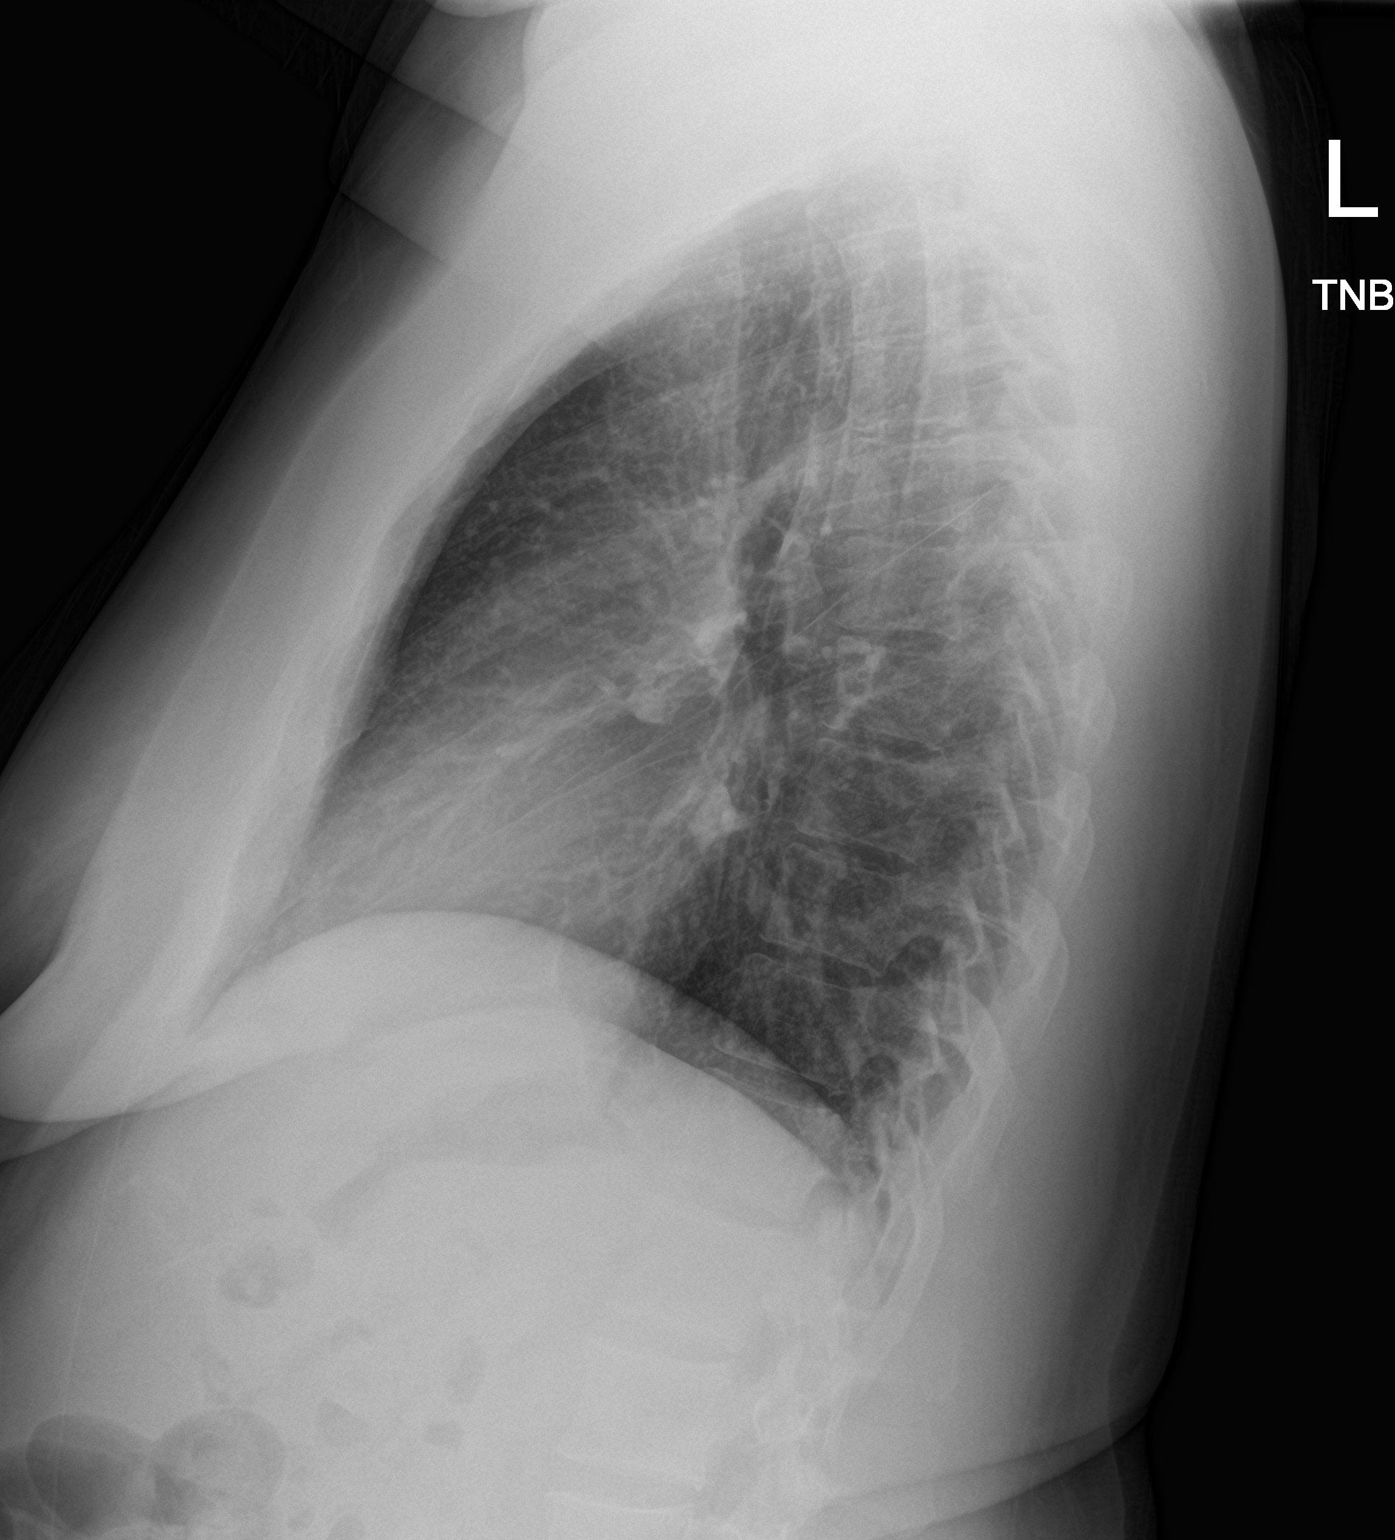

[2 of 2 positions shown; findings below may reference images not displayed]

FINDINGS: Normal heart size. Normal mediastinal contour. No pneumothorax. No
pleural effusion. Lungs appear clear, with no acute consolidative
airspace disease and no pulmonary edema.
IMPRESSION: No active cardiopulmonary disease.

## 2021-09-26 ENCOUNTER — Other Ambulatory Visit: Payer: Self-pay | Admitting: Neurology

## 2021-11-05 NOTE — Progress Notes (Deleted)
PATIENT: Amanda Horne DOB: Feb 14, 1986  REASON FOR VISIT: follow up HISTORY FROM: patient  No chief complaint on file.   HISTORY OF PRESENT ILLNESS:  11/05/21 ALL: Amanda Horne is a 36 y.o. female here today for follow up for RRMS. She continues Tysabri and is tolerating well. MRI 10/2019 stable. JCV 0.34, inhibition assay negative. MRI 12/2020 stable.   She reports feeling well.   Chronic fatigue seems to be a little better. Armodafinil was not very helpful. Phentermine seems to be working better. She tries to exercise regularly.   She feels that gait is stable. No recent falls. She takes baclofen, mostly at night, for spacticity and muscle pain. Losartan/HCTZ helps to control BP (now written by PCP).  Headaches are well managed with zonisamide 100-200mg  at bedtime. Mood seems good. She continues Wellbutrin 150mg  daily and feels it helps.   She reports seeing GYN for abnormal menstrual bleeding. She has an on 12/29/2019. She continues depo for contraception.    HISTORY: (copied from Dr 02/28/2020 previous note)  Amanda Horne Is a 36 y.o. 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  She tolerates it well.  She has been JCV Ab negative )0.28 tested 10/26/2020).    She feels her MS is stable.  No exacerbations or new symptoms.   Gait is about the same.    She can easily walk 3 miles without stopping.  Balance is now fine.   She denies falls.   She can go downstairs without the bannister on stairs.     She denies weakness but has some spasticity in legs.  She has no numbness.   She has no issues with her bladder.   Vision is fine with each eye but she notes dysconjugate eyes at times (was worse initially in 2018)   She has fatigue.  She stopped the stimulants as she felt better on Prozac alone.    She sleeps well most nights.   She feels mood and cognition are doing well.        She is trying to do some diet  changes and lost some weight.     She has done the booster for Covid and had it in 2021.       MS History:    In late March, 2018, she had severe vomiting and dizziness.   She went to the ED and was given fluids and Zofran.   A couple days later, she noes the right eye wandered to the right and the left eye had jerking.   She also had right hand numbness and slurred speech.     She went back to the ED 08/20/2016 and MRI was worrisome for MS.   She was admitted and received 5 days of IV Solu-Medrol.     She improved but not to baseline.   In retrospect, in 2016,  she had phasic muscle spasms associated with weakness on her right side lasting 30-90 seconds.    She started Tysabri 09/2016.     IMAGING MRI of the brain dated 08/20/2016 shows in the posterior fossa there are right cerebellar focus adjacent to the fourth ventricle and a left posterior pontomedullary focus.   There are many T2/FLAIR hyperintense foci or ventricular, juxtacortical and deep white matter consistent with MS. 2 foci, one in the right temporal region and one in the left frontal region enhance after contrast.  MRI brain 11/10/2019 showed T2/FLAIR hyperintense foci, predominantly in the periventricular white matter but also in the juxtacortical and deep white matter of the hemispheres and in the thalamus, pons and spinal cord.  None of the foci appear to be acute.  They do not enhance.  Compared to the MRI dated 06/29/2018, there are no new lesions.     Chronic maxillary and ethmoid sinusitis.  REVIEW OF SYSTEMS: Out of a complete 14 system review of symptoms, the patient complains only of the following symptoms, chronic fatigue, headaches, and all other reviewed systems are negative.  ALLERGIES: No Known Allergies  HOME MEDICATIONS: Outpatient Medications Prior to Visit  Medication Sig Dispense Refill   ALPRAZolam (XANAX) 0.5 MG tablet Take 1-2 tablets 30 min. prior to MRI.  You must not drink alcohol, drive or operate dangerous  equipment after taking this medication. 2 tablet 0   ALPRAZolam (XANAX) 0.5 MG tablet Take 1-2 pills before the MRi 2 tablet 0   baclofen (LIORESAL) 20 MG tablet TAKE 3 TO 4 TABLETS BY MOUTH DAILY FOR SPASTICITY 360 tablet 3   CALCIUM PO Take 1 Dose by mouth daily.     cholecalciferol (VITAMIN D) 1000 units tablet Take 2,000 Units by mouth daily.     FLUoxetine (PROZAC) 10 MG capsule Take 10 mg by mouth daily.     hydrOXYzine (ATARAX/VISTARIL) 25 MG tablet Take 1 tablet by mouth as needed.     hydrOXYzine (VISTARIL) 25 MG capsule Take 25-50 mg by mouth daily as needed.     medroxyPROGESTERone Acetate 150 MG/ML SUSY      methylPREDNISolone (MEDROL) 4 MG tablet Taper from 6 pills po for one day to 1 pill po the last day over 6 days 21 tablet 0   natalizumab (TYSABRI) 300 MG/15ML injection Inject 300 mg into the vein every 30 (thirty) days.     UNABLE TO FIND Vitality Multivitamin and Mineral     UNABLE TO FIND Take 1 Dose by mouth daily. Med Name: Ogranic beet root     zonisamide (ZONEGRAN) 100 MG capsule Take 1-2 capsules (100-200 mg total) by mouth at bedtime. TAKE 1 TO 2 CAPSULES BY  MOUTH AT NIGHT 180 capsule 1   No facility-administered medications prior to visit.    PAST MEDICAL HISTORY: Past Medical History:  Diagnosis Date   Abnormal Pap smear    Herpes    Hx of varicella    Hypertension    Multiple sclerosis (HCC)    Vision abnormalities     PAST SURGICAL HISTORY: Past Surgical History:  Procedure Laterality Date   COLPOSCOPY     WISDOM TOOTH EXTRACTION      FAMILY HISTORY: Family History  Problem Relation Age of Onset   Hypertension Mother    Diabetes Mother    Diabetes Sister    Other Paternal Uncle        Wiskott Aldnch syndrome, died as a child   Healthy Father     SOCIAL HISTORY: Social History   Socioeconomic History   Marital status: Single    Spouse name: Not on file   Number of children: Not on file   Years of education: Not on file   Highest  education level: Not on file  Occupational History   Not on file  Tobacco Use   Smoking status: Never   Smokeless tobacco: Never  Vaping Use   Vaping Use: Never used  Substance and Sexual Activity   Alcohol use: No   Drug use:  No   Sexual activity: Not on file  Other Topics Concern   Not on file  Social History Narrative   Not on file   Social Determinants of Health   Financial Resource Strain: Not on file  Food Insecurity: Not on file  Transportation Needs: Not on file  Physical Activity: Not on file  Stress: Not on file  Social Connections: Not on file  Intimate Partner Violence: Not on file      PHYSICAL EXAM  There were no vitals filed for this visit.  There is no height or weight on file to calculate BMI.  Generalized: Well developed, in no acute distress  Cardiology: normal rate and rhythm, no murmur noted Respiratory: clear to auscultation bilaterally  Neurological examination  Mentation: Alert oriented to time, place, history taking. Follows all commands speech and language fluent Cranial nerve II-XII: Pupils were equal round reactive to light. Extraocular movements were full, visual field were full on confrontational test. Facial sensation and strength were normal. Uvula tongue midline. Head turning and shoulder shrug  were normal and symmetric. Motor: The motor testing reveals 5 over 5 strength of all 4 extremities. Good symmetric motor tone is noted throughout.  Sensory: Sensory testing is intact to soft touch on all 4 extremities. No evidence of extinction is noted.  Coordination: Cerebellar testing reveals good finger-nose-finger and heel-to-shin bilaterally.  Gait and station: Gait is normal.  Reflexes: Deep tendon reflexes are symmetric and normal bilaterally.   DIAGNOSTIC DATA (LABS, IMAGING, TESTING) - I reviewed patient records, labs, notes, testing and imaging myself where available.      No data to display           Lab Results  Component  Value Date   WBC 8.7 05/02/2021   HGB 11.2 05/02/2021   HCT 34.1 05/02/2021   MCV 73 (L) 05/02/2021   PLT 261 05/02/2021      Component Value Date/Time   NA 141 12/21/2019 1344   K 3.8 12/21/2019 1344   CL 106 12/21/2019 1344   CO2 22 12/21/2019 1344   GLUCOSE 87 12/21/2019 1344   GLUCOSE 109 (H) 08/24/2016 0523   BUN 14 12/21/2019 1344   CREATININE 0.74 12/21/2019 1344   CALCIUM 9.3 12/21/2019 1344   PROT 6.8 12/21/2019 1344   ALBUMIN 4.3 12/21/2019 1344   AST 26 12/21/2019 1344   ALT 32 12/21/2019 1344   ALKPHOS 89 12/21/2019 1344   BILITOT 0.3 12/21/2019 1344   GFRNONAA 105 12/21/2019 1344   GFRAA 121 12/21/2019 1344   No results found for: "CHOL", "HDL", "LDLCALC", "LDLDIRECT", "TRIG", "CHOLHDL" No results found for: "HGBA1C" Lab Results  Component Value Date   VITAMINB12 1,718 (H) 08/21/2016   No results found for: "TSH"     ASSESSMENT AND PLAN 36 y.o. year old female  has a past medical history of Abnormal Pap smear, Herpes, varicella, Hypertension, Multiple sclerosis ([facility]), and Vision abnormalities. here with   No diagnosis found.   Zemira is doing well today. We will continue current treatment plan. I will update labs today. She was encouraged to continue working on healthy lifestyle habits. She will follow up in 6 months, sooner if needed. She verbalizes understanding and agreement with this plan.    No orders of the defined types were placed in this encounter.    No orders of the defined types were placed in this encounter.      Debbora Presto, FNP-C 11/05/2021, 4:46 PM [facility] [address]  [place], [place] [address] [phone]

## 2021-11-06 ENCOUNTER — Ambulatory Visit: Payer: Self-pay | Admitting: Family Medicine

## 2021-11-06 ENCOUNTER — Encounter: Payer: Self-pay | Admitting: Family Medicine

## 2021-12-12 ENCOUNTER — Telehealth: Payer: Self-pay | Admitting: Neurology

## 2021-12-12 ENCOUNTER — Other Ambulatory Visit (INDEPENDENT_AMBULATORY_CARE_PROVIDER_SITE_OTHER): Payer: Self-pay

## 2021-12-12 ENCOUNTER — Other Ambulatory Visit: Payer: Self-pay | Admitting: *Deleted

## 2021-12-12 DIAGNOSIS — Z79899 Other long term (current) drug therapy: Secondary | ICD-10-CM

## 2021-12-12 DIAGNOSIS — G35 Multiple sclerosis: Secondary | ICD-10-CM

## 2021-12-12 DIAGNOSIS — Z0289 Encounter for other administrative examinations: Secondary | ICD-10-CM

## 2021-12-12 NOTE — Telephone Encounter (Signed)
Placed JCV lab in [facility] lock box for routine lab pick up. Results pending. 

## 2021-12-13 LAB — CBC WITH DIFFERENTIAL/PLATELET
Basophils Absolute: 0 10*3/uL (ref 0.0–0.2)
Basos: 0 %
EOS (ABSOLUTE): 0.2 10*3/uL (ref 0.0–0.4)
Eos: 2 %
Hematocrit: 35.3 % (ref 34.0–46.6)
Hemoglobin: 11.2 g/dL (ref 11.1–15.9)
Immature Grans (Abs): 0 10*3/uL (ref 0.0–0.1)
Immature Granulocytes: 0 %
Lymphocytes Absolute: 5 10*3/uL — ABNORMAL HIGH (ref 0.7–3.1)
Lymphs: 45 %
MCH: 23.2 pg — ABNORMAL LOW (ref 26.6–33.0)
MCHC: 31.7 g/dL (ref 31.5–35.7)
MCV: 73 fL — ABNORMAL LOW (ref 79–97)
Monocytes Absolute: 0.8 10*3/uL (ref 0.1–0.9)
Monocytes: 8 %
Neutrophils Absolute: 5.1 10*3/uL (ref 1.4–7.0)
Neutrophils: 45 %
Platelets: 237 10*3/uL (ref 150–450)
RBC: 4.83 x10E6/uL (ref 3.77–5.28)
RDW: 15.8 % — ABNORMAL HIGH (ref 11.7–15.4)
WBC: 11.3 10*3/uL — ABNORMAL HIGH (ref 3.4–10.8)

## 2021-12-18 ENCOUNTER — Ambulatory Visit (INDEPENDENT_AMBULATORY_CARE_PROVIDER_SITE_OTHER): Payer: 59 | Admitting: Neurology

## 2021-12-18 ENCOUNTER — Encounter: Payer: Self-pay | Admitting: Neurology

## 2021-12-18 VITALS — BP 130/88 | HR 76 | Ht 64.0 in | Wt 185.5 lb

## 2021-12-18 DIAGNOSIS — E559 Vitamin D deficiency, unspecified: Secondary | ICD-10-CM

## 2021-12-18 DIAGNOSIS — Z79899 Other long term (current) drug therapy: Secondary | ICD-10-CM | POA: Diagnosis not present

## 2021-12-18 DIAGNOSIS — G35 Multiple sclerosis: Secondary | ICD-10-CM | POA: Diagnosis not present

## 2021-12-18 DIAGNOSIS — R5383 Other fatigue: Secondary | ICD-10-CM

## 2021-12-18 DIAGNOSIS — G43009 Migraine without aura, not intractable, without status migrainosus: Secondary | ICD-10-CM | POA: Diagnosis not present

## 2021-12-18 DIAGNOSIS — R269 Unspecified abnormalities of gait and mobility: Secondary | ICD-10-CM

## 2021-12-18 DIAGNOSIS — G4700 Insomnia, unspecified: Secondary | ICD-10-CM

## 2021-12-18 MED ORDER — ZONISAMIDE 100 MG PO CAPS: 100.0000 mg | ORAL_CAPSULE | Freq: Every day | ORAL | 3 refills | Status: DC

## 2021-12-18 MED ORDER — ALPRAZOLAM 0.5 MG PO TABS: ORAL_TABLET | ORAL | 0 refills | Status: DC

## 2021-12-18 MED ORDER — BACLOFEN 20 MG PO TABS: ORAL_TABLET | ORAL | 3 refills | Status: DC

## 2021-12-18 NOTE — Progress Notes (Signed)
[facility]  PATIENT: Amanda Horne WAS DOB: [birth year]  REFERRING DOCTOR OR PCP:  [employer] SOURCE: patient, notes from Dr. Gerarda Fraction, results on EMR, MRI images on PACS  _________________________________   HISTORICAL  CHIEF COMPLAINT:  Chief Complaint  Patient presents with   Follow-up    Rm 1, alone. Here for yearly MS f/u, on Tysabri and tolerating well. Last infusion date: 12/12/2021 and Next infusion date: 01/09/2022 MS stable.     HISTORY OF PRESENT ILLNESS:  Amanda Horne Is a 36 y.o. 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  She tolerates it well.  Next infusion in 01/09/2022.   She has been JCV Ab negative )0.34 tested 05/02/2021).    She feels her MS is stable.  No exacerbations or new symptoms.  Gait is about the same.    She can easily walk 3 miles without stopping.  Balance is fine.     She can go downstairs without the bannister on stairs.     She denies weakness but has some spasticity in legs.  She has no numbness.   She has no issues with her bladder.  She had a UTI earlier this year but it was easily treated.    She has some constipation and is trying to drinkmore water.   Vision is fine with each eye but she notes dysconjugate eyes at times (was worse initially in 2018)  She has fatigue.  She stopped the stimulants as she felt better on Prozac alone.    She sleeps only 5 hours most nights.   Sleep onset is sometimes difficult due to racing thoughts and getting comfortable.  She has an adjustable bedShe feels mood and cognition are doing well.       Migraines are better with zonisamide.    She is trying to do some diet changes and lost some weight.            MS History:    In late March, 2018, she had severe vomiting and dizziness.   She went to the ED and was given fluids and Zofran.   A couple days later, she noes the right eye wandered to the right and the left eye had  jerking.   She also had right hand numbness and slurred speech.     She went back to the ED 08/20/2016 and MRI was worrisome for MS.   She was admitted and received 5 days of IV Solu-Medrol.     She improved but not to baseline.   In retrospect, in 2016,  she had phasic muscle spasms associated with weakness on her right side lasting 30-90 seconds.    She started Tysabri 09/2016.    IMAGING MRI of the brain dated 08/20/2016 shows in the posterior fossa there are right cerebellar focus adjacent to the fourth ventricle and a left posterior pontomedullary focus.   There are many T2/FLAIR hyperintense foci or ventricular, juxtacortical and deep white matter consistent with MS. 2 foci, one in the right temporal region and one in the left frontal region enhance after contrast.  MRI brain 11/10/2019 showed T2/FLAIR hyperintense foci, predominantly in the periventricular white matter but also in the juxtacortical and deep white matter of the hemispheres and in the thalamus, pons and spinal cord.  None of the foci appear to be acute.  They do not enhance.  Compared to the MRI dated 06/29/2018, there are no new  lesions.     Chronic maxillary and ethmoid sinusitis.  REVIEW OF SYSTEMS: Constitutional: No fevers, chills, sweats, or change in appetite Eyes: No visual changes, double vision, eye pain Ear, nose and throat: No hearing loss, ear pain, nasal congestion, sore throat Cardiovascular: No chest pain, palpitations Respiratory:  No shortness of breath at rest or with exertion.   No wheezes GastrointestinaI: No nausea, vomiting, diarrhea, abdominal pain, fecal incontinence Genitourinary:  No dysuria, urinary retention or frequency.  No nocturia. Musculoskeletal:  No neck pain, back pain Integumentary: No rash, pruritus, skin lesions Neurological: as above Psychiatric: No depression at this time.  No anxiety Endocrine: No palpitations, diaphoresis, change in appetite, change in weigh or increased  thirst Hematologic/Lymphatic:  No anemia, purpura, petechiae. Allergic/Immunologic: No itchy/runny eyes, nasal congestion, recent allergic reactions, rashes  ALLERGIES: No Known Allergies  HOME MEDICATIONS:  Current Outpatient Medications:    CALCIUM PO, Take 1 Dose by mouth daily., Disp: , Rfl:    cholecalciferol (VITAMIN D) 1000 units tablet, Take 2,000 Units by mouth daily., Disp: , Rfl:    hydrOXYzine (ATARAX/VISTARIL) 25 MG tablet, Take 1 tablet by mouth as needed., Disp: , Rfl:    losartan (COZAAR) 25 MG tablet, Take 25 mg by mouth daily., Disp: , Rfl:    natalizumab (TYSABRI) 300 MG/15ML injection, Inject 300 mg into the vein every 30 (thirty) days., Disp: , Rfl:    UNABLE TO FIND, Vitality Multivitamin and Mineral, Disp: , Rfl:    UNABLE TO FIND, Take 1 Dose by mouth daily. Med Name: Ogranic beet root, Disp: , Rfl:    ALPRAZolam (XANAX) 0.5 MG tablet, Take 1-2 tablets 30 min. prior to MRI.  You must not drink alcohol, drive or operate dangerous equipment after taking this medication., Disp: 2 tablet, Rfl: 0   baclofen (LIORESAL) 20 MG tablet, TAKE 3 TO 4 TABLETS BY MOUTH DAILY FOR SPASTICITY, Disp: 360 tablet, Rfl: 3   zonisamide (ZONEGRAN) 100 MG capsule, Take 1-2 capsules (100-200 mg total) by mouth at bedtime. TAKE 1 TO 2 CAPSULES BY  MOUTH AT NIGHT, Disp: 180 capsule, Rfl: 3  PAST MEDICAL HISTORY: Past Medical History:  Diagnosis Date   Abnormal Pap smear    Herpes    Hx of varicella    Hypertension    Multiple sclerosis ([place])    Vision abnormalities     PAST SURGICAL HISTORY: Past Surgical History:  Procedure Laterality Date   COLPOSCOPY     WISDOM TOOTH EXTRACTION      FAMILY HISTORY: Family History  Problem Relation Age of Onset   Hypertension Mother    Diabetes Mother    Diabetes Sister    Other Paternal Uncle        Wiskott Aldnch syndrome, died as a child   Healthy Father     SOCIAL HISTORY:  Social History   Socioeconomic History   Marital  status: Single    Spouse name: Not on file   Number of children: Not on file   Years of education: Not on file   Highest education level: Not on file  Occupational History   Not on file  Tobacco Use   Smoking status: Never   Smokeless tobacco: Never  Vaping Use   Vaping Use: Never used  Substance and Sexual Activity   Alcohol use: No   Drug use: No   Sexual activity: Not on file  Other Topics Concern   Not on file  Social History Narrative   Not on file  Social Determinants of Health   Financial Resource Strain: Not on file  Food Insecurity: Not on file  Transportation Needs: Not on file  Physical Activity: Not on file  Stress: Not on file  Social Connections: Not on file  Intimate Partner Violence: Not on file     PHYSICAL EXAM  Vitals:   12/18/21 0912  BP: 130/88  Pulse: 76  Weight: 185 lb 8 oz (84.1 kg)  Height: 5\' 4"  (1.626 m)    Body mass index is 31.84 kg/m.   General: The patient is well-developed and well-nourished and in no acute distress.  Neck has good range of motion. Neurologic Exam  Mental status: The patient is alert and oriented x 3 at the time of the examination. The patient has apparent normal recent and remote memory, with an apparently normal attention span and concentration ability.   Speech is normal.  Cranial nerves: Extraocular movements are full.  Facial strength is normal.  Trapezius strength is normal.. No obvious hearing deficits are noted.  Motor:  Muscle bulk is normal.  Muscle tone was normal in the arms and legs today.  Strength is 5/5  Sensory: She has normal sensation to touch and vibration in the arms and legs.  Coordination: She has good finger-nose-finger and heel-to-shin  Gait and station: Station is normal.  Gait is normal.  Tandem gait is very minimally wide.  Romberg is negative  Reflexes: Deep tendon reflexes are increased in the legs with spread at the knees.   There is no clonus.      DIAGNOSTIC DATA  (LABS, IMAGING, TESTING) - I reviewed patient records, labs, notes, testing and imaging myself where available.  Lab Results  Component Value Date   WBC 11.3 (H) 12/12/2021   HGB 11.2 12/12/2021   HCT 35.3 12/12/2021   MCV 73 (L) 12/12/2021   PLT 237 12/12/2021      Component Value Date/Time   NA 141 12/21/2019 1344   K 3.8 12/21/2019 1344   CL 106 12/21/2019 1344   CO2 22 12/21/2019 1344   GLUCOSE 87 12/21/2019 1344   GLUCOSE 109 (H) 08/24/2016 0523   BUN 14 12/21/2019 1344   CREATININE 0.74 12/21/2019 1344   CALCIUM 9.3 12/21/2019 1344   PROT 6.8 12/21/2019 1344   ALBUMIN 4.3 12/21/2019 1344   AST 26 12/21/2019 1344   ALT 32 12/21/2019 1344   ALKPHOS 89 12/21/2019 1344   BILITOT 0.3 12/21/2019 1344   GFRNONAA 105 12/21/2019 1344   GFRAA 121 12/21/2019 1344    Lab Results  Component Value Date   VITAMINB12 1,718 (H) 08/21/2016        ASSESSMENT AND PLAN  1. Relapsing remitting multiple sclerosis (HCC)   2. High risk medication use   3. Migraine without aura and without status migrainosus, not intractable   4. Other fatigue   5. Vitamin D deficiency   6. Gait disturbance   7. Insomnia, unspecified type       1.    She will continue Tysabri 300 g every 4 weeks.  JCV antibody test is pending and will be checked every 6 months or so.   We will check an MRI of the brain and cervical spine to determine if there is any subclinical progression and also consider a change in therapy if this is occurring. 2.   Stay active and exercise as tolerated. 3.   Continue zonisamide for migraine.  Continue baclofen for spasticity.  Renew these 4.    RTC  6 months or sooner if there are new or worsening neurologic symptoms   Cymone Yeske A. Epimenio Foot, MD, PhD, FAAN Certified in Neurology, Clinical Neurophysiology, Sleep Medicine, Pain Medicine and Neuroimaging Director, [facility] at [facility]  [facility] [address], [address] [place], [place] [address] [phone]

## 2021-12-24 NOTE — Telephone Encounter (Signed)
Received the results from JCV JCV index value: .36 JCV-Antibody indeterminate Inhibition assay: negative.   Will provide to Dr Epimenio Foot for review.

## 2022-02-14 ENCOUNTER — Telehealth: Payer: Self-pay | Admitting: Neurology

## 2022-02-14 MED ORDER — METHYLPREDNISOLONE 4 MG PO TBPK: ORAL_TABLET | ORAL | 0 refills | Status: DC

## 2022-02-14 NOTE — Telephone Encounter (Signed)
Pt called stating that yesterday she started experiencing pain in her L shoulder down to her hand and a sharp pain in her back when she breaths. She states that this morning at 5am she woke up because of the pain which is now worse. She does not know if she is having a exacerbation or what it is and would like to speak to an Therapist, sports.

## 2022-02-14 NOTE — Telephone Encounter (Signed)
Called the patient back and advised that he doesn't think this is necessarily a MS exacerbation but that he would call in standard dose pack to see if the patient notes benefit. Advised I would call that in to [facility] in [place]. Pt verbalized understanding. Pt will still await a call to get scheduled for the MRI's.

## 2022-02-14 NOTE — Telephone Encounter (Signed)
Called the pt back. Pt states yesterday afternoon the pain in the left side/shoulder started. She said it was not as severe and that she came home rested and it eased up. She woke up this morning to the pain. States the pain is in the left shoulder down into the hand and numbness is present as well. The numbness is constant. Denies any injury or any accident or motion that would have caused this. 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 Denies any shocking like sensation if she bends head forward Denies any signs of infection. States yesterday she did note a stuffiness in her nose and she as percaution took a covid test and it was negative. No other signs of infection and no other symptoms but the sharp pain in back when she breaths and left arm/han pain and numbness. Had her tysabri infusion 02/07/2022.  She has not had to have steroids in several years due to has been stable. Advised I would inform Dr Felecia Shelling of her symptoms and see what he recommends. The patient was ordered to have MRI brain and neck completed which doesn't look like she completed yet. Pt states she will go ahead and schedule these. I will send this to our MRI scheduler to see if she can reach out to the patient about scheduling for imaging. Pt verbalized understanding. Pt had no questions at this time but was encouraged to call back if questions arise.

## 2022-02-14 NOTE — Telephone Encounter (Signed)
Pt scheduled for MRI brain wo contrast and MRI cervical wo contrast at [facility] for 10/3 at 10:00am   [facility] auth # [id number] (exp 02/14/22-03/31/22)

## 2022-02-18 ENCOUNTER — Telehealth: Payer: Self-pay | Admitting: Neurology

## 2022-02-18 NOTE — Telephone Encounter (Signed)
Pt is calling. Stated she wants to change the time of her MRI. Pt is requesting a call-back from nurse.

## 2022-02-19 ENCOUNTER — Ambulatory Visit: Payer: 59

## 2022-02-19 DIAGNOSIS — G35 Multiple sclerosis: Secondary | ICD-10-CM

## 2022-03-04 ENCOUNTER — Ambulatory Visit: Payer: Self-pay | Admitting: Family Medicine

## 2022-05-15 ENCOUNTER — Telehealth: Payer: Self-pay | Admitting: Neurology

## 2022-05-15 NOTE — Telephone Encounter (Signed)
She comes in today for her Tysabri infusion.    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  This was about 2 months ago that she noticed a change.  She saw her an ophthalmologist earlier today and was told that vision is worse.  She was correctable to 20/50 on the right and 20/20 on the left.  Color vision was symmetric today.  Funduscopic examination showed very mild optic nerve pallor on the right and normal optic nerve on the left.  It is possible that she had an episode of right optic neuritis.  The onset of symptoms was more insidious, however.  On 02/19/2022, she had an MRI of the brain.  I took another look at that study.  It is unchanged compared to the MRI from 12/31/2020.  Optic nerve size was symmetric.  The optic chiasm was normal.  She reports that she is going to be referred to a retina specialist.

## 2022-06-13 ENCOUNTER — Other Ambulatory Visit: Payer: Self-pay | Admitting: *Deleted

## 2022-06-13 ENCOUNTER — Other Ambulatory Visit (INDEPENDENT_AMBULATORY_CARE_PROVIDER_SITE_OTHER): Payer: Self-pay

## 2022-06-13 ENCOUNTER — Telehealth: Payer: Self-pay | Admitting: *Deleted

## 2022-06-13 ENCOUNTER — Encounter: Payer: Self-pay | Admitting: Neurology

## 2022-06-13 DIAGNOSIS — G35 Multiple sclerosis: Secondary | ICD-10-CM

## 2022-06-13 DIAGNOSIS — Z0289 Encounter for other administrative examinations: Secondary | ICD-10-CM

## 2022-06-13 DIAGNOSIS — Z79899 Other long term (current) drug therapy: Secondary | ICD-10-CM

## 2022-06-13 NOTE — Telephone Encounter (Signed)
Placed JCV lab in [facility] lock box for routine lab pick up. Results pending. 

## 2022-06-14 LAB — CBC WITH DIFFERENTIAL/PLATELET
Basophils Absolute: 0 10*3/uL (ref 0.0–0.2)
Basos: 0 %
EOS (ABSOLUTE): 0.3 10*3/uL (ref 0.0–0.4)
Eos: 3 %
Hematocrit: 34.4 % (ref 34.0–46.6)
Hemoglobin: 10.7 g/dL — ABNORMAL LOW (ref 11.1–15.9)
Immature Grans (Abs): 0 10*3/uL (ref 0.0–0.1)
Immature Granulocytes: 0 %
Lymphocytes Absolute: 2.6 10*3/uL (ref 0.7–3.1)
Lymphs: 25 %
MCH: 22 pg — ABNORMAL LOW (ref 26.6–33.0)
MCHC: 31.1 g/dL — ABNORMAL LOW (ref 31.5–35.7)
MCV: 71 fL — ABNORMAL LOW (ref 79–97)
Monocytes Absolute: 0.9 10*3/uL (ref 0.1–0.9)
Monocytes: 8 %
Neutrophils Absolute: 6.5 10*3/uL (ref 1.4–7.0)
Neutrophils: 64 %
Platelets: 249 10*3/uL (ref 150–450)
RBC: 4.87 x10E6/uL (ref 3.77–5.28)
RDW: 17.4 % — ABNORMAL HIGH (ref 11.7–15.4)
WBC: 10.3 10*3/uL (ref 3.4–10.8)

## 2022-06-20 NOTE — Telephone Encounter (Signed)
JCV antibody returned from 06/13/22 indeterminate with index value 0.34   Inhibition assay: negative

## 2022-07-01 NOTE — Progress Notes (Unsigned)
PATIENT: Amanda Horne DOB: [birth year]  REASON FOR VISIT: follow up HISTORY FROM: patient  No chief complaint on file.    HISTORY OF PRESENT ILLNESS:  07/01/22 Amanda Horne is a 37 y.o. female here today for follow up for RRMS. She continues Tysabri and is tolerating well. MRI brain and cervical spine stable 02/2022. CBC stable 05/2022. JCV 0.34, inhibition assay negative on 06/13/2022.   She reports feeling well.   Chronic fatigue seems to be a little better. Armodafinil was not very helpful. Phentermine seems to be working better. She tries to exercise regularly.   She feels that gait is stable. No recent falls. She takes baclofen, mostly at night, for spacticity and muscle pain. Losartan/HCTZ helps to control BP (now written by PCP).  Headaches are well managed with zonisamide 100-242m at bedtime. Mood seems good. She continues Wellbutrin 1530mdaily and feels it helps.   She reports seeing GYN for abnormal menstrual bleeding. She has an [place] 12/29/2019. She continues depo for contraception.   HISTORY: (copied from Dr SaGarth Bignessote on 06/23/2019)  Amanda Horne a [age].o. 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  She tolerates it well.  Next infusion in 01/09/2022.   She has been JCV Ab negative )0.34 tested 05/02/2021).    She feels her MS is stable.  No exacerbations or new symptoms.   Gait is about the same.    She can easily walk 3 miles without stopping.  Balance is fine.     She can go downstairs without the bannister on stairs.     She denies weakness but has some spasticity in legs.  She has no numbness.   She has no issues with her bladder.  She had a UTI earlier this year but it was easily treated.    She has some constipation and is trying to drinkmore water.   Vision is fine with each eye but she notes dysconjugate eyes at times (was worse initially in 2018)   She has fatigue.  She stopped the  stimulants as she felt better on Prozac alone.    She sleeps only 5 hours most nights.   Sleep onset is sometimes difficult due to racing thoughts and getting comfortable.  She has an adjustable bedShe feels mood and cognition are doing well.        Migraines are better with zonisamide.     She is trying to do some diet changes and lost some weight.             MS History:    In late March, 2018, she had severe vomiting and dizziness.   She went to the ED and was given fluids and Zofran.   A couple days later, she noes the right eye wandered to the right and the left eye had jerking.   She also had right hand numbness and slurred speech.     She went back to the ED 08/20/2016 and MRI was worrisome for MS.   She was admitted and received 5 days of IV Solu-Medrol.     She improved but not to baseline.   In retrospect, in 2016,  she had phasic muscle spasms associated with weakness on her right side lasting 30-90 seconds.    She started Tysabri 09/2016.     IMAGING MRI of the brain dated 08/20/2016 shows in the posterior fossa there are  right cerebellar focus adjacent to the fourth ventricle and a left posterior pontomedullary focus.   There are many T2/FLAIR hyperintense foci or ventricular, juxtacortical and deep white matter consistent with MS. 2 foci, one in the right temporal region and one in the left frontal region enhance after contrast.   MRI brain 11/10/2019 showed T2/FLAIR hyperintense foci, predominantly in the periventricular white matter but also in the juxtacortical and deep white matter of the hemispheres and in the thalamus, pons and spinal cord.  None of the foci appear to be acute.  They do not enhance.  Compared to the MRI dated 06/29/2018, there are no new lesions.     Chronic maxillary and ethmoid sinusitis.   REVIEW OF SYSTEMS: Out of a complete 14 system review of symptoms, the patient complains only of the following symptoms, chronic fatigue, headaches, and all other reviewed systems  are negative.  ALLERGIES: No Known Allergies  HOME MEDICATIONS: Outpatient Medications Prior to Visit  Medication Sig Dispense Refill   ALPRAZolam (XANAX) 0.5 MG tablet Take 1-2 tablets 30 min. prior to MRI.  You must not drink alcohol, drive or operate dangerous equipment after taking this medication. 2 tablet 0   baclofen (LIORESAL) 20 MG tablet TAKE 3 TO 4 TABLETS BY MOUTH DAILY FOR SPASTICITY 360 tablet 3   CALCIUM PO Take 1 Dose by mouth daily.     cholecalciferol (VITAMIN D) 1000 units tablet Take 2,000 Units by mouth daily.     hydrOXYzine (ATARAX/VISTARIL) 25 MG tablet Take 1 tablet by mouth as needed.     losartan (COZAAR) 25 MG tablet Take 25 mg by mouth daily.     methylPREDNISolone (MEDROL DOSEPAK) 4 MG TBPK tablet Take as directed 21 tablet 0   natalizumab (TYSABRI) 300 MG/15ML injection Inject 300 mg into the vein every 30 (thirty) days.     UNABLE TO FIND Vitality Multivitamin and Mineral     UNABLE TO FIND Take 1 Dose by mouth daily. Med Name: Ogranic beet root     zonisamide (ZONEGRAN) 100 MG capsule Take 1-2 capsules (100-200 mg total) by mouth at bedtime. TAKE 1 TO 2 CAPSULES BY  MOUTH AT NIGHT 180 capsule 3   No facility-administered medications prior to visit.    PAST MEDICAL HISTORY: Past Medical History:  Diagnosis Date   Abnormal Pap smear    Herpes    Hx of varicella    Hypertension    Multiple sclerosis ([place])    Vision abnormalities     PAST SURGICAL HISTORY: Past Surgical History:  Procedure Laterality Date   COLPOSCOPY     WISDOM TOOTH EXTRACTION      FAMILY HISTORY: Family History  Problem Relation Age of Onset   Hypertension Mother    Diabetes Mother    Diabetes Sister    Other Paternal Uncle        Wiskott Aldnch syndrome, died as a child   Healthy Father     SOCIAL HISTORY: Social History   Socioeconomic History   Marital status: Single    Spouse name: Not on file   Number of children: Not on file   Years of education: Not on  file   Highest education level: Not on file  Occupational History   Not on file  Tobacco Use   Smoking status: Never   Smokeless tobacco: Never  Vaping Use   Vaping Use: Never used  Substance and Sexual Activity   Alcohol use: No   Drug use: No  Sexual activity: Not on file  Other Topics Concern   Not on file  Social History Narrative   Not on file   Social Determinants of Health   Financial Resource Strain: Not on file  Food Insecurity: Not on file  Transportation Needs: Not on file  Physical Activity: Not on file  Stress: Not on file  Social Connections: Not on file  Intimate Partner Violence: Not on file      PHYSICAL EXAM  There were no vitals filed for this visit.  There is no height or weight on file to calculate BMI.  Generalized: Well developed, in no acute distress  Cardiology: normal rate and rhythm, no murmur noted Respiratory: clear to auscultation bilaterally  Neurological examination  Mentation: Alert oriented to time, place, history taking. Follows all commands speech and language fluent Cranial nerve II-XII: Pupils were equal round reactive to light. Extraocular movements were full, visual field were full on confrontational test. Facial sensation and strength were normal. Uvula tongue midline. Head turning and shoulder shrug  were normal and symmetric. Motor: The motor testing reveals 5 over 5 strength of all 4 extremities. Good symmetric motor tone is noted throughout.  Sensory: Sensory testing is intact to soft touch on all 4 extremities. No evidence of extinction is noted.  Coordination: Cerebellar testing reveals good finger-nose-finger and heel-to-shin bilaterally.  Gait and station: Gait is normal.  Reflexes: Deep tendon reflexes are symmetric and normal bilaterally.   DIAGNOSTIC DATA (LABS, IMAGING, TESTING) - I reviewed patient records, labs, notes, testing and imaging myself where available.      No data to display           Lab  Results  Component Value Date   WBC 10.3 06/13/2022   HGB 10.7 (L) 06/13/2022   HCT 34.4 06/13/2022   MCV 71 (L) 06/13/2022   PLT 249 06/13/2022      Component Value Date/Time   NA 141 12/21/2019 1344   K 3.8 12/21/2019 1344   CL 106 12/21/2019 1344   CO2 22 12/21/2019 1344   GLUCOSE 87 12/21/2019 1344   GLUCOSE 109 (H) 08/24/2016 0523   BUN 14 12/21/2019 1344   CREATININE 0.74 12/21/2019 1344   CALCIUM 9.3 12/21/2019 1344   PROT 6.8 12/21/2019 1344   ALBUMIN 4.3 12/21/2019 1344   AST 26 12/21/2019 1344   ALT 32 12/21/2019 1344   ALKPHOS 89 12/21/2019 1344   BILITOT 0.3 12/21/2019 1344   GFRNONAA 105 12/21/2019 1344   GFRAA 121 12/21/2019 1344   No results found for: "CHOL", "HDL", "LDLCALC", "LDLDIRECT", "TRIG", "CHOLHDL" No results found for: "HGBA1C" Lab Results  Component Value Date   VITAMINB12 1,718 (H) 08/21/2016   No results found for: "TSH"     ASSESSMENT AND PLAN 37 y.o. year old female  has a past medical history of Abnormal Pap smear, Herpes, varicella, Hypertension, Multiple sclerosis ([place]), and Vision abnormalities. here with   No diagnosis found.   Casara is doing well today. We will continue current treatment plan. I will update labs today. She was encouraged to continue working on healthy lifestyle habits. She will follow up in 6 months, sooner if needed. She verbalizes understanding and agreement with this plan.    No orders of the defined types were placed in this encounter.    No orders of the defined types were placed in this encounter.    I spent 30 minutes with the patient. 50% of this time was spent counseling and educating patient  on plan of care and medications.     Amanda Presto, FNP-C 07/01/2022, 4:45 PM [facility] [address], [place], [place] [address] [phone]

## 2022-07-01 NOTE — Patient Instructions (Incomplete)
Below is our plan:  We will continue current treatment plan. I will add phentermine for fatigue. Please take first thing in the mornings to keep from worsening insomnia. Try avoiding naps. Use sleep hygiene techniques to see if this helps with sleep. Let me know if you continue to have concerns. Keep an eye on your blood pressure.   Please make sure you are staying well hydrated. I recommend 50-60 ounces daily. Well balanced diet and regular exercise encouraged. Consistent sleep schedule with 6-8 hours recommended.   Please continue follow up with care team as directed.   Follow up with me in 6 months   You may receive a survey regarding today's visit. I encourage you to leave honest feed back as I do use this information to improve patient care. Thank you for seeing me today!

## 2022-07-02 ENCOUNTER — Ambulatory Visit: Payer: 59 | Admitting: Family Medicine

## 2022-07-02 ENCOUNTER — Encounter: Payer: Self-pay | Admitting: Family Medicine

## 2022-07-02 VITALS — BP 127/90 | HR 82 | Ht 64.0 in | Wt 179.0 lb

## 2022-07-02 DIAGNOSIS — R5383 Other fatigue: Secondary | ICD-10-CM

## 2022-07-02 DIAGNOSIS — G35 Multiple sclerosis: Secondary | ICD-10-CM

## 2022-07-02 DIAGNOSIS — Z79899 Other long term (current) drug therapy: Secondary | ICD-10-CM | POA: Diagnosis not present

## 2022-07-02 DIAGNOSIS — E559 Vitamin D deficiency, unspecified: Secondary | ICD-10-CM

## 2022-07-02 MED ORDER — PHENTERMINE HCL 37.5 MG PO CAPS: 37.5000 mg | ORAL_CAPSULE | ORAL | 1 refills | Status: DC

## 2022-12-02 ENCOUNTER — Other Ambulatory Visit: Payer: Self-pay

## 2022-12-02 ENCOUNTER — Other Ambulatory Visit: Payer: Self-pay | Admitting: *Deleted

## 2022-12-02 ENCOUNTER — Telehealth: Payer: Self-pay | Admitting: *Deleted

## 2022-12-02 DIAGNOSIS — G35 Multiple sclerosis: Secondary | ICD-10-CM

## 2022-12-02 DIAGNOSIS — Z79899 Other long term (current) drug therapy: Secondary | ICD-10-CM

## 2022-12-02 NOTE — Telephone Encounter (Signed)
Placed JCV lab in [facility] lock box for routine lab pick up. Results pending. 

## 2022-12-03 LAB — CBC WITH DIFFERENTIAL/PLATELET
Basophils Absolute: 0.1 10*3/uL (ref 0.0–0.2)
Basos: 0 %
EOS (ABSOLUTE): 0.3 10*3/uL (ref 0.0–0.4)
Eos: 2 %
Hematocrit: 31.6 % — ABNORMAL LOW (ref 34.0–46.6)
Hemoglobin: 10 g/dL — ABNORMAL LOW (ref 11.1–15.9)
Immature Grans (Abs): 0 10*3/uL (ref 0.0–0.1)
Immature Granulocytes: 0 %
Lymphocytes Absolute: 5.6 10*3/uL — ABNORMAL HIGH (ref 0.7–3.1)
Lymphs: 49 %
MCH: 23 pg — ABNORMAL LOW (ref 26.6–33.0)
MCHC: 31.6 g/dL (ref 31.5–35.7)
MCV: 73 fL — ABNORMAL LOW (ref 79–97)
Monocytes Absolute: 0.9 10*3/uL (ref 0.1–0.9)
Monocytes: 8 %
Neutrophils Absolute: 4.7 10*3/uL (ref 1.4–7.0)
Neutrophils: 41 %
Platelets: 284 10*3/uL (ref 150–450)
RBC: 4.35 x10E6/uL (ref 3.77–5.28)
RDW: 15.9 % — ABNORMAL HIGH (ref 11.7–15.4)
WBC: 11.5 10*3/uL — ABNORMAL HIGH (ref 3.4–10.8)

## 2022-12-12 NOTE — Telephone Encounter (Signed)
JCV returned  Index value 0.26 JCV antibody indeterminate   Inhibition assay final result:  negative

## 2022-12-30 NOTE — Progress Notes (Deleted)
PATIENT: Amanda Horne DOB: 1985-10-15  REASON FOR VISIT: follow up HISTORY FROM: patient  No chief complaint on file.    HISTORY OF PRESENT ILLNESS:  12/30/22 Amanda Horne is a 37 y.o. female here today for follow up for RRMS. She continues Tysabri and is tolerating well. MRI brain and cervical spine stable 02/2022. CBC stable 11/2022. JCV 0.26, inhibition assay negative on 06/13/2022.   She reports feeling well. She feels that gait is stable. No recent falls. She takes baclofen, mostly at night, for spacticity and muscle pain.   She has had sharp pains in the right hand that wax and wane. Numb at times. No particular times when it is better or worse with the exception of when she is stressed. She is using a stress ball to do exercises and feels this helps.   Chronic fatigue is worse. Armodafinil was not very helpful in past. Phentermine was helping but stopped when she was on Wellbutrin. She tries to exercise three days a week. She is having some trouble sleeping. She feels it is hard to get to sleep. She usually takes 1-2 hour nap after work, around 4-5p, then goes to bed around 2am. She wakes at [employer].   Headaches are well managed with zonisamide 100-200mg  at bedtime. Mood seems good. She discontinued Wellbutrin due to increased sweating. She was switched to Prozac but felt she did not need it.    She has not been consistent with taking vitamin D.   No changes in vision. Eye exam 05/2022 was reassuring. She is getting new lenses.    HISTORY: (copied from Dr Bonnita Hollow previous note)  Amanda Horne Is a 37 y.o. 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  She tolerates it well.  Next infusion in 01/09/2022.   She has been JCV Ab negative )0.34 tested 05/02/2021).    She feels her MS is stable.  No exacerbations or new symptoms.   Gait is about the same.    She can easily walk 3 miles without stopping.  Balance  is fine.     She can go downstairs without the bannister on stairs.     She denies weakness but has some spasticity in legs.  She has no numbness.   She has no issues with her bladder.  She had a UTI earlier this year but it was easily treated.    She has some constipation and is trying to drinkmore water.   Vision is fine with each eye but she notes dysconjugate eyes at times (was worse initially in 2018)   She has fatigue.  She stopped the stimulants as she felt better on Prozac alone.    She sleeps only 5 hours most nights.   Sleep onset is sometimes difficult due to racing thoughts and getting comfortable.  She has an adjustable bedShe feels mood and cognition are doing well.        Migraines are better with zonisamide.     She is trying to do some diet changes and lost some weight.             MS History:    In late March, 2018, she had severe vomiting and dizziness.   She went to the ED and was given fluids and Zofran.   A couple days later, she noes the right eye wandered to the right and the left eye had jerking.  She also had right hand numbness and slurred speech.     She went back to the ED 08/20/2016 and MRI was worrisome for MS.   She was admitted and received 5 days of IV Solu-Medrol.     She improved but not to baseline.   In retrospect, in 2016,  she had phasic muscle spasms associated with weakness on her right side lasting 30-90 seconds.    She started Tysabri 09/2016.     IMAGING MRI of the brain dated 08/20/2016 shows in the posterior fossa there are right cerebellar focus adjacent to the fourth ventricle and a left posterior pontomedullary focus.   There are many T2/FLAIR hyperintense foci or ventricular, juxtacortical and deep white matter consistent with MS. 2 foci, one in the right temporal region and one in the left frontal region enhance after contrast.   MRI brain 11/10/2019 showed T2/FLAIR hyperintense foci, predominantly in the periventricular white matter but also in the  juxtacortical and deep white matter of the hemispheres and in the thalamus, pons and spinal cord.  None of the foci appear to be acute.  They do not enhance.  Compared to the MRI dated 06/29/2018, there are no new lesions.     Chronic maxillary and ethmoid sinusitis.   REVIEW OF SYSTEMS: Out of a complete 14 system review of symptoms, the patient complains only of the following symptoms, chronic fatigue, headaches, right hand pain. and all other reviewed systems are negative.  ALLERGIES: No Known Allergies  HOME MEDICATIONS: Outpatient Medications Prior to Visit  Medication Sig Dispense Refill   ALPRAZolam (XANAX) 0.5 MG tablet Take 1-2 tablets 30 min. prior to MRI.  You must not drink alcohol, drive or operate dangerous equipment after taking this medication. (Patient not taking: Reported on 07/02/2022) 2 tablet 0   baclofen (LIORESAL) 20 MG tablet TAKE 3 TO 4 TABLETS BY MOUTH DAILY FOR SPASTICITY 360 tablet 3   CALCIUM PO Take 1 Dose by mouth daily.     cholecalciferol (VITAMIN D) 1000 units tablet Take 2,000 Units by mouth daily.     hydrOXYzine (ATARAX/VISTARIL) 25 MG tablet Take 1 tablet by mouth as needed.     losartan (COZAAR) 25 MG tablet Take 25 mg by mouth daily.     methylPREDNISolone (MEDROL DOSEPAK) 4 MG TBPK tablet Take as directed 21 tablet 0   natalizumab (TYSABRI) 300 MG/15ML injection Inject 300 mg into the vein every 30 (thirty) days.     phentermine 37.5 MG capsule Take 1 capsule (37.5 mg total) by mouth every morning. 90 capsule 1   UNABLE TO FIND Vitality Multivitamin and Mineral     UNABLE TO FIND Take 1 Dose by mouth daily. Med Name: Ogranic beet root     zonisamide (ZONEGRAN) 100 MG capsule Take 1-2 capsules (100-200 mg total) by mouth at bedtime. TAKE 1 TO 2 CAPSULES BY  MOUTH AT NIGHT 180 capsule 3   No facility-administered medications prior to visit.    PAST MEDICAL HISTORY: Past Medical History:  Diagnosis Date   Abnormal Pap smear    Herpes    Hx of  varicella    Hypertension    Multiple sclerosis (HCC)    Vision abnormalities     PAST SURGICAL HISTORY: Past Surgical History:  Procedure Laterality Date   COLPOSCOPY     WISDOM TOOTH EXTRACTION      FAMILY HISTORY: Family History  Problem Relation Age of Onset   Hypertension Mother    Diabetes Mother  Diabetes Sister    Other Paternal Uncle        Wiskott Aldnch syndrome, died as a child   Healthy Father     SOCIAL HISTORY: Social History   Socioeconomic History   Marital status: Single    Spouse name: Not on file   Number of children: Not on file   Years of education: Not on file   Highest education level: Not on file  Occupational History   Not on file  Tobacco Use   Smoking status: Never   Smokeless tobacco: Never  Vaping Use   Vaping status: Never Used  Substance and Sexual Activity   Alcohol use: No   Drug use: No   Sexual activity: Not on file  Other Topics Concern   Not on file  Social History Narrative   Not on file   Social Determinants of Health   Financial Resource Strain: Not on file  Food Insecurity: Not on file  Transportation Needs: Not on file  Physical Activity: Not on file  Stress: Not on file  Social Connections: Not on file  Intimate Partner Violence: Not on file      PHYSICAL EXAM  There were no vitals filed for this visit.   There is no height or weight on file to calculate BMI.  Generalized: Well developed, in no acute distress  Cardiology: normal rate and rhythm, no murmur noted Respiratory: clear to auscultation bilaterally  Neurological examination  Mentation: Alert oriented to time, place, history taking. Follows all commands speech and language fluent Cranial nerve II-XII: Pupils were equal round reactive to light. Extraocular movements were full, visual field were full on confrontational test. Facial sensation and strength were normal. Uvula tongue midline. Head turning and shoulder shrug  were normal and  symmetric. Motor: The motor testing reveals 5 over 5 strength of all 4 extremities. Good symmetric motor tone is noted throughout. No weakness of right hand noted.  Sensory: Sensory testing is intact to soft touch on all 4 extremities. No evidence of extinction is noted.  Coordination: Cerebellar testing reveals good finger-nose-finger and heel-to-shin bilaterally.  Gait and station: Gait is normal.  Reflexes: Deep tendon reflexes are symmetric and normal bilaterally.   DIAGNOSTIC DATA (LABS, IMAGING, TESTING) - I reviewed patient records, labs, notes, testing and imaging myself where available.      No data to display           Lab Results  Component Value Date   WBC 11.5 (H) 12/02/2022   HGB 10.0 (L) 12/02/2022   HCT 31.6 (L) 12/02/2022   MCV 73 (L) 12/02/2022   PLT 284 12/02/2022      Component Value Date/Time   NA 141 12/21/2019 1344   K 3.8 12/21/2019 1344   CL 106 12/21/2019 1344   CO2 22 12/21/2019 1344   GLUCOSE 87 12/21/2019 1344   GLUCOSE 109 (H) 08/24/2016 0523   BUN 14 12/21/2019 1344   CREATININE 0.74 12/21/2019 1344   CALCIUM 9.3 12/21/2019 1344   PROT 6.8 12/21/2019 1344   ALBUMIN 4.3 12/21/2019 1344   AST 26 12/21/2019 1344   ALT 32 12/21/2019 1344   ALKPHOS 89 12/21/2019 1344   BILITOT 0.3 12/21/2019 1344   GFRNONAA 105 12/21/2019 1344   GFRAA 121 12/21/2019 1344   No results found for: "CHOL", "HDL", "LDLCALC", "LDLDIRECT", "TRIG", "CHOLHDL" No results found for: "HGBA1C" Lab Results  Component Value Date   VITAMINB12 1,718 (H) 08/21/2016   No results found for: "TSH"  ASSESSMENT AND PLAN 37 y.o. year old female  has a past medical history of Abnormal Pap smear, Herpes, varicella, Hypertension, Multiple sclerosis (HCC), and Vision abnormalities. here with   No diagnosis found.    Shajuan is doing well today. We will continue current treatment plan. Last labs and imaging reviewed in Epic. We will restart phentermine for MS fatigue. She was  encouraged to take early in am to help prevent worsening insomnia. Sleep hygiene reviewed. She will keep an eye on BP. Continue exercise. Consider resuming counseling if needed. Resume vitamin D supplements. She was encouraged to continue working on healthy lifestyle habits. She will follow up in 6 months, sooner if needed. She verbalizes understanding and agreement with this plan.    No orders of the defined types were placed in this encounter.    No orders of the defined types were placed in this encounter.    I spent 30 minutes with the patient. 50% of this time was spent counseling and educating patient on plan of care and medications.     Shawnie Dapper, FNP-C 12/30/2022, 3:57 PM [facility] [address] [place], [place] [address] [phone]

## 2022-12-31 ENCOUNTER — Encounter: Payer: Self-pay | Admitting: Family Medicine

## 2022-12-31 ENCOUNTER — Ambulatory Visit: Payer: 59 | Admitting: Family Medicine

## 2022-12-31 DIAGNOSIS — E559 Vitamin D deficiency, unspecified: Secondary | ICD-10-CM

## 2022-12-31 DIAGNOSIS — G43009 Migraine without aura, not intractable, without status migrainosus: Secondary | ICD-10-CM

## 2022-12-31 DIAGNOSIS — R5383 Other fatigue: Secondary | ICD-10-CM

## 2022-12-31 DIAGNOSIS — Z79899 Other long term (current) drug therapy: Secondary | ICD-10-CM

## 2022-12-31 DIAGNOSIS — G35 Multiple sclerosis: Secondary | ICD-10-CM

## 2022-12-31 DIAGNOSIS — G4700 Insomnia, unspecified: Secondary | ICD-10-CM

## 2023-01-13 ENCOUNTER — Other Ambulatory Visit: Payer: Self-pay | Admitting: Neurology

## 2023-01-14 NOTE — Telephone Encounter (Signed)
Last seen on 07/02/22 No follow up scheduled

## 2023-03-04 ENCOUNTER — Telehealth: Payer: Self-pay

## 2023-03-04 NOTE — Telephone Encounter (Signed)
Faxed Tysabri Form to [employer] [phone] on 03/04/2023

## 2023-03-18 ENCOUNTER — Encounter: Payer: Self-pay | Admitting: Neurology

## 2023-03-24 ENCOUNTER — Other Ambulatory Visit: Payer: Self-pay | Admitting: *Deleted

## 2023-03-24 DIAGNOSIS — Z0289 Encounter for other administrative examinations: Secondary | ICD-10-CM

## 2023-03-24 NOTE — Telephone Encounter (Signed)
Last seen on 07/02/22 No 6 month follow up scheduled

## 2023-04-02 ENCOUNTER — Other Ambulatory Visit: Payer: Self-pay | Admitting: Neurology

## 2023-04-02 ENCOUNTER — Ambulatory Visit: Payer: 59 | Admitting: Neurology

## 2023-04-02 ENCOUNTER — Encounter: Payer: Self-pay | Admitting: Neurology

## 2023-04-02 ENCOUNTER — Telehealth: Payer: Self-pay | Admitting: Neurology

## 2023-04-02 VITALS — BP 137/83 | HR 72 | Ht 63.0 in | Wt 172.6 lb

## 2023-04-02 DIAGNOSIS — G35 Multiple sclerosis: Secondary | ICD-10-CM | POA: Diagnosis not present

## 2023-04-02 DIAGNOSIS — G43009 Migraine without aura, not intractable, without status migrainosus: Secondary | ICD-10-CM

## 2023-04-02 DIAGNOSIS — R200 Anesthesia of skin: Secondary | ICD-10-CM | POA: Diagnosis not present

## 2023-04-02 DIAGNOSIS — Z79899 Other long term (current) drug therapy: Secondary | ICD-10-CM

## 2023-04-02 DIAGNOSIS — E559 Vitamin D deficiency, unspecified: Secondary | ICD-10-CM

## 2023-04-02 MED ORDER — ZONISAMIDE 100 MG PO CAPS: ORAL_CAPSULE | ORAL | 3 refills | Status: DC

## 2023-04-02 MED ORDER — PHENTERMINE HCL 37.5 MG PO CAPS: 37.5000 mg | ORAL_CAPSULE | ORAL | 1 refills | Status: DC

## 2023-04-02 MED ORDER — ALPRAZOLAM 0.5 MG PO TABS: ORAL_TABLET | ORAL | 0 refills | Status: AC

## 2023-04-02 MED ORDER — OXYBUTYNIN CHLORIDE ER 10 MG PO TB24: 10.0000 mg | ORAL_TABLET | Freq: Every day | ORAL | 3 refills | Status: DC

## 2023-04-02 NOTE — Progress Notes (Signed)
[facility]  PATIENT: Amanda Horne DOB: 1985-10-09  REFERRING DOCTOR OR PCP:  Elfredia Nevins SOURCE: patient, notes from Dr. Sherwood Gambler, results on EMR, MRI images on PACS  _________________________________   HISTORICAL  CHIEF COMPLAINT:  Chief Complaint  Patient presents with   Follow-up    Pt in room 10 alone. Here for MS follow up. DMT: Tysabri. Pt said she noticed balance issues, pt said her coworker said pt appear to be dragging her leg when walking. Pt said she has noticed urine urgency x 1 month now., no burning with urination.  Needs refill on Zonegran and phentermine.    HISTORY OF PRESENT ILLNESS:  Amanda Horne Is a 37 y.o. 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  She tolerates it well.  Next infusion in 01/09/2022.   She has been JCV Ab negative 0.26 and negative reflex 12/01/2020).    She feels her MS is stable.  No exacerbations or new symptoms.  Gait is about the same.    She can easily walk 3 miles without stopping.  Balance is fine.     She can go downstairs without the bannister on stairs.     She denies weakness but has some spasticity in legs, helped by baclofen.  She has no numbness.     She has had urge bladder incontinence and has frequency.   She has nocturia x 2      Vision is fine and diplopia resolved  She has fatigue.  She stopped the stimulants as she felt better on Prozac alone.    She sleeps only 5 hours most nights.   Sleep onset is sometimes difficult due to racing thoughts and getting comfortable.  She has an adjustable bedShe feels mood and cognition are doing well.       Migraines are better with zonisamide.   She has iron deficiency and takes supplements.  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   She went to the ED and was given fluids and Zofran.   A couple days later, she noes the right eye wandered to the right and the left eye had jerking.   She also had right hand numbness and slurred speech.     She went back to the ED 08/20/2016 and MRI was worrisome for MS.   She was admitted and received 5 days of IV Solu-Medrol.     She improved but not to baseline.   In retrospect, in 2016,  she had phasic muscle spasms associated with weakness on her right side lasting 30-90 seconds.    She started Tysabri 09/2016.    IMAGING MRI of the brain dated 08/20/2016 shows in the posterior fossa there are right cerebellar focus adjacent to the fourth ventricle and a left posterior pontomedullary focus.   There are many T2/FLAIR hyperintense foci or ventricular, juxtacortical and deep white matter consistent with MS. 2 foci, one in the right temporal region and one in the left frontal region enhance after contrast.  MRI brain 11/10/2019 showed T2/FLAIR hyperintense foci, predominantly in the periventricular white matter but also in the juxtacortical and deep white matter of the hemispheres and in the thalamus, pons and  spinal cord.  None of the foci appear to be acute.  They do not enhance.  Compared to the MRI dated 06/29/2018, there are no new lesions.     Chronic maxillary and ethmoid sinusitis.  MRI brain 02/20/2023 showed T2/FLAIR hyperintense foci in the periventricular and juxtacortical white matter of the cerebral hemispheres. A few additional foci are noted in the pons, right cerebellar hemisphere and spinal cord. These are consistent with chronic demyelinating plaque associated with multiple sclerosis. None of them appear to be acute. They do not enhance. Compared to the MRI from 12/31/2020, there are no new lesions.   MRI cervical 02/20/23 showed There are T2 hyperintense foci posteriorly to the left adjacent to C2, posteriorly bilaterally adjacent to C3, anteriorly adjacent to C4-C5,  and posteriorly adjacent to C6. All of these foci were present on the previous MRI.[name]   REVIEW OF SYSTEMS: Constitutional: No fevers, chills, sweats, or change in appetite Eyes: No visual changes, double vision, eye pain Ear, nose and throat: No hearing loss, ear pain, nasal congestion, sore throat Cardiovascular: No chest pain, palpitations Respiratory:  No shortness of breath at rest or with exertion.   No wheezes GastrointestinaI: No nausea, vomiting, diarrhea, abdominal pain, fecal incontinence Genitourinary:  No dysuria, urinary retention or frequency.  No nocturia. Musculoskeletal:  No neck pain, back pain Integumentary: No rash, pruritus, skin lesions Neurological: as above Psychiatric: No depression at this time.  No anxiety Endocrine: No palpitations, diaphoresis, change in appetite, change in weigh or increased thirst Hematologic/Lymphatic:  No anemia, purpura, petechiae. Allergic/Immunologic: No itchy/runny eyes, nasal congestion, recent allergic reactions, rashes  ALLERGIES: No Known Allergies  HOME MEDICATIONS:  Current Outpatient Medications:    baclofen (LIORESAL) 20 MG tablet, TAKE 3 TO 4 TABLETS BY MOUTH ONCE DAILY FOR SPASTICITY, Disp: 120 tablet, Rfl: 0   CALCIUM PO, Take 1 Dose by mouth daily., Disp: , Rfl:    cholecalciferol (VITAMIN D) 1000 units tablet, Take 2,000 Units by mouth daily., Disp: , Rfl:    losartan (COZAAR) 25 MG tablet, Take 25 mg by mouth daily., Disp: , Rfl:    natalizumab (TYSABRI) 300 MG/15ML injection, Inject 300 mg into the vein every 30 (thirty) days., Disp: , Rfl:    oxybutynin (DITROPAN XL) 10 MG 24 hr tablet, Take 1 tablet (10 mg total) by mouth at bedtime., Disp: 90 tablet, Rfl: 3   UNABLE TO FIND, Vitality Multivitamin and Mineral, Disp: , Rfl:    UNABLE TO FIND, Take 1 Dose by mouth daily. Med Name: Ogranic beet root, Disp: , Rfl:    ALPRAZolam (XANAX) 0.5 MG tablet, Take 1-2 tablets 30 min. prior to MRI.  You must not drink alcohol,  drive or operate dangerous equipment after taking this medication. (Patient not taking: Reported on 07/02/2022), Disp: 2 tablet, Rfl: 0   hydrOXYzine (ATARAX/VISTARIL) 25 MG tablet, Take 1 tablet by mouth as needed. (Patient not taking: Reported on 04/02/2023), Disp: , Rfl:    methylPREDNISolone (MEDROL DOSEPAK) 4 MG TBPK tablet, Take as directed (Patient not taking: Reported on 04/02/2023), Disp: 21 tablet, Rfl: 0   phentermine 37.5 MG capsule, Take 1 capsule (37.5 mg total) by mouth every morning., Disp: 90 capsule, Rfl: 1   zonisamide (ZONEGRAN) 100 MG capsule, One po qHS, Disp: 90 capsule, Rfl: 3  PAST MEDICAL HISTORY: Past Medical History:  Diagnosis Date   Abnormal Pap smear    Herpes    Hx of varicella    Hypertension    Multiple sclerosis (HCC)  Vision abnormalities     PAST SURGICAL HISTORY: Past Surgical History:  Procedure Laterality Date   COLPOSCOPY     WISDOM TOOTH EXTRACTION      FAMILY HISTORY: Family History  Problem Relation Age of Onset   Hypertension Mother    Diabetes Mother    Diabetes Sister    Other Paternal Uncle        Wiskott Aldnch syndrome, died as a child   Healthy Father     SOCIAL HISTORY:  Social History   Socioeconomic History   Marital status: Single    Spouse name: Not on file   Number of children: Not on file   Years of education: Not on file   Highest education level: Not on file  Occupational History   Not on file  Tobacco Use   Smoking status: Never   Smokeless tobacco: Never  Vaping Use   Vaping status: Never Used  Substance and Sexual Activity   Alcohol use: No   Drug use: No   Sexual activity: Not on file  Other Topics Concern   Not on file  Social History Narrative   Not on file   Social Determinants of Health   Financial Resource Strain: Not on file  Food Insecurity: Not on file  Transportation Needs: Not on file  Physical Activity: Not on file  Stress: Not on file  Social Connections: Not on file   Intimate Partner Violence: Not on file     PHYSICAL EXAM  Vitals:   04/02/23 0918 04/02/23 0923  BP: (!) 147/90 137/83  Pulse: 72   Weight: 172 lb 9.6 oz (78.3 kg)   Height: 5\' 3"  (1.6 m)     Body mass index is 30.57 kg/m.   General: The patient is well-developed and well-nourished and in no acute distress.  Neck has good range of motion. Neurologic Exam  Mental status: The patient is alert and oriented x 3 at the time of the examination. The patient has apparent normal recent and remote memory, with an apparently normal attention span and concentration ability.   Speech is normal.  Cranial nerves: Extraocular movements are full.  Facial strength is normal.  Trapezius strength is normal.. No obvious hearing deficits are noted.  Motor:  Muscle bulk is normal.  Muscle tone was normal in the arms and legs today.  Strength is 5/5  Sensory: She has normal sensation to touch and vibration in the arms and legs.  Coordination: She has good finger-nose-finger and heel-to-shin  Gait and station: Station is normal.  Gait is normal.  Tandem gait is minimally wide.  Romberg is negative  Reflexes: Deep tendon reflexes are increased in the legs with spread at the knees.   There is no clonus.      DIAGNOSTIC DATA (LABS, IMAGING, TESTING) - I reviewed patient records, labs, notes, testing and imaging myself where available.  Lab Results  Component Value Date   WBC 11.5 (H) 12/02/2022   HGB 10.0 (L) 12/02/2022   HCT 31.6 (L) 12/02/2022   MCV 73 (L) 12/02/2022   PLT 284 12/02/2022      Component Value Date/Time   NA 141 12/21/2019 1344   K 3.8 12/21/2019 1344   CL 106 12/21/2019 1344   CO2 22 12/21/2019 1344   GLUCOSE 87 12/21/2019 1344   GLUCOSE 109 (H) 08/24/2016 0523   BUN 14 12/21/2019 1344   CREATININE 0.74 12/21/2019 1344   CALCIUM 9.3 12/21/2019 1344   PROT 6.8 12/21/2019 1344  ALBUMIN 4.3 12/21/2019 1344   AST 26 12/21/2019 1344   ALT 32 12/21/2019 1344    ALKPHOS 89 12/21/2019 1344   BILITOT 0.3 12/21/2019 1344   GFRNONAA 105 12/21/2019 1344   GFRAA 121 12/21/2019 1344    Lab Results  Component Value Date   VITAMINB12 1,718 (H) 08/21/2016        ASSESSMENT AND PLAN  1. Relapsing remitting multiple sclerosis (HCC)   2. Numbness   3. High risk medication use   4. Migraine without aura and without status migrainosus, not intractable   5. Vitamin D deficiency      1.  Continue Tysabri 300 g every 4 weeks.  JCV antibody test is negative and will be checked every 6 months or so.   We will check an MRI of the brain to determine if there is any subclinical progression and also consider a change in therapy if this is occurring. 2.   Stay active and exercise as tolerated. 3.   Continue zonisamide for migraine.  Continue baclofen for spasticity.  Renew these 4.    Oxybutynin for bladder. 5.   Contine phentermine as stimulant for MS fatigu and weight.  RTC 6 months or sooner if there are new or worsening neurologic symptoms   Aleister Lady A. Epimenio Foot, MD, PhD, FAAN Certified in Neurology, Clinical Neurophysiology, Sleep Medicine, Pain Medicine and Neuroimaging Director, [facility]  [facility] [address] [place], [place] [address] [phone]

## 2023-04-02 NOTE — Telephone Encounter (Signed)
Dr.Sater please see the below. Are you wiling to prescribe Rx?

## 2023-04-02 NOTE — Telephone Encounter (Signed)
Pt scheduled for 45 mins MR brain w/wo contrast at [facility] for 04/16/23 at 8am. Pt is requesting medication to help with claustrophobia  [facility] NPR case# [id number]

## 2023-04-07 ENCOUNTER — Telehealth: Payer: Self-pay | Admitting: Neurology

## 2023-04-07 NOTE — Telephone Encounter (Signed)
Pt calling to cancel MRI appointment due to death in the family. Would like a call back to reschedule

## 2023-04-07 NOTE — Telephone Encounter (Signed)
I spoke to the patient and she said she will keep the same appointment, she had the dates confused.

## 2023-04-16 ENCOUNTER — Other Ambulatory Visit: Payer: 59

## 2023-04-16 DIAGNOSIS — R200 Anesthesia of skin: Secondary | ICD-10-CM

## 2023-04-16 DIAGNOSIS — G35 Multiple sclerosis: Secondary | ICD-10-CM | POA: Diagnosis not present

## 2023-04-16 MED ORDER — GADOBENATE DIMEGLUMINE 529 MG/ML IV SOLN
15.0000 mL | Freq: Once | INTRAVENOUS | Status: AC | PRN
  Administered 2023-04-16: 15 mL via INTRAVENOUS

## 2023-04-22 ENCOUNTER — Other Ambulatory Visit: Payer: Self-pay

## 2023-04-22 MED ORDER — BACLOFEN 20 MG PO TABS: ORAL_TABLET | ORAL | 0 refills | Status: DC

## 2023-06-05 ENCOUNTER — Other Ambulatory Visit: Payer: Self-pay

## 2023-06-05 ENCOUNTER — Telehealth: Payer: Self-pay

## 2023-06-05 DIAGNOSIS — G35 Multiple sclerosis: Secondary | ICD-10-CM

## 2023-06-05 NOTE — Telephone Encounter (Signed)
Placed JCV in [facility] 06/05/2023

## 2023-06-06 LAB — CBC WITH DIFFERENTIAL/PLATELET
Basophils Absolute: 0 10*3/uL (ref 0.0–0.2)
Basos: 0 %
EOS (ABSOLUTE): 0.2 10*3/uL (ref 0.0–0.4)
Eos: 2 %
Hematocrit: 31.1 % — ABNORMAL LOW (ref 34.0–46.6)
Hemoglobin: 9 g/dL — ABNORMAL LOW (ref 11.1–15.9)
Immature Grans (Abs): 0 10*3/uL (ref 0.0–0.1)
Immature Granulocytes: 0 %
Lymphocytes Absolute: 4.9 10*3/uL — ABNORMAL HIGH (ref 0.7–3.1)
Lymphs: 44 %
MCH: 18.3 pg — ABNORMAL LOW (ref 26.6–33.0)
MCHC: 28.9 g/dL — ABNORMAL LOW (ref 31.5–35.7)
MCV: 63 fL — ABNORMAL LOW (ref 79–97)
Monocytes Absolute: 0.8 10*3/uL (ref 0.1–0.9)
Monocytes: 7 %
Neutrophils Absolute: 5.2 10*3/uL (ref 1.4–7.0)
Neutrophils: 47 %
Platelets: 366 10*3/uL (ref 150–450)
RBC: 4.93 x10E6/uL (ref 3.77–5.28)
RDW: 21.9 % — ABNORMAL HIGH (ref 11.7–15.4)
WBC: 11.1 10*3/uL — ABNORMAL HIGH (ref 3.4–10.8)

## 2023-06-16 NOTE — Telephone Encounter (Signed)
Amanda Horne

## 2023-06-30 ENCOUNTER — Encounter: Payer: Self-pay | Admitting: Neurology

## 2023-09-02 ENCOUNTER — Encounter: Payer: Self-pay | Admitting: Neurology

## 2023-09-02 ENCOUNTER — Other Ambulatory Visit: Payer: Self-pay | Admitting: *Deleted

## 2023-09-02 ENCOUNTER — Other Ambulatory Visit: Payer: Self-pay | Admitting: Neurology

## 2023-09-02 MED ORDER — BACLOFEN 20 MG PO TABS: ORAL_TABLET | ORAL | 0 refills | Status: DC

## 2023-10-16 ENCOUNTER — Encounter: Payer: Self-pay | Admitting: Neurology

## 2023-10-16 ENCOUNTER — Ambulatory Visit: Payer: 59 | Admitting: Neurology

## 2023-10-16 VITALS — BP 143/95 | HR 87 | Ht 64.0 in | Wt 174.0 lb

## 2023-10-16 DIAGNOSIS — R200 Anesthesia of skin: Secondary | ICD-10-CM | POA: Diagnosis not present

## 2023-10-16 DIAGNOSIS — Z79899 Other long term (current) drug therapy: Secondary | ICD-10-CM

## 2023-10-16 DIAGNOSIS — G35 Multiple sclerosis: Secondary | ICD-10-CM

## 2023-10-16 MED ORDER — PHENTERMINE HCL 37.5 MG PO TABS: 37.5000 mg | ORAL_TABLET | Freq: Every day | ORAL | 1 refills | Status: DC

## 2023-10-16 NOTE — Progress Notes (Signed)
 [facility]  PATIENT: Amanda Horne DOB: July 19, 1985  REFERRING DOCTOR OR PCP:  Amanda Horne SOURCE: patient, notes from Dr. Lewayne Horne, results on EMR, MRI images on PACS  _________________________________   HISTORICAL  CHIEF COMPLAINT:  Chief Complaint  Patient presents with   RM10/MS    Pt is here Alone. Pt states that everything has been going well with her MS. Pt states that her blood pressure had been going up and she went to her PCP and got her medication increased. Pt would like to talk alternatives of the Phentermine .     HISTORY OF PRESENT ILLNESS:  Amanda Horne Is a 38 y.o. 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  She tolerates it well.  Next infusion in 10/29/2023.   She has been JCV Ab negative 0.28 and negative reflex 12/01/2020).    She feels her MS is stable.  No exacerbations or new symptoms.  Gait is doing well.    She can easily walk 3 miles without stopping.  Balance is fine.  She can go downstairs without the bannister on stairs.     She felt slightly dizzy walking up bleachers - otherwise fine.   She denies weakness.  Now only takes baclofen  at night.    She has no numbness.     She has less urinary frequency.   Nocturia resolved and she stopped oxybutynin .    Vision is fine and diplopia resolved  She will be starting an exercise and diet program soon - A1c has been mildly elevated in past.   She has fatigue. Phentermine  has helped but BP is a little high at times.    She sleeps only 5 hours most nights.   Sleep onset is sometimes difficult due to racing thoughts and getting comfortable.  She feels mood and cognition are doing well.     No longer on antdepreessant.  She had grief with sister passing..  Migraines are better with zonisamide .   She has iron deficiency and takes supplements.  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   She went to the ED and was given fluids and Zofran .   A couple days later, she noes the right eye wandered to the right and the left eye had jerking.   She also had right hand numbness and slurred speech.     She went back to the ED 08/20/2016 and MRI was worrisome for MS.   She was admitted and received 5 days of IV Solu-Medrol .     She improved but not to baseline.   In retrospect, in 2016,  she had phasic muscle spasms associated with weakness on her right side lasting 30-90 seconds.    She started Tysabri 09/2016.    IMAGING MRI of the brain dated 08/20/2016 shows in the posterior fossa there are right cerebellar focus adjacent to the fourth ventricle and a left posterior pontomedullary focus.   There are many T2/FLAIR hyperintense foci or ventricular, juxtacortical and deep white matter consistent with MS. 2 foci, one in the right temporal region and one in the left frontal region enhance after contrast.  MRI brain 11/10/2019 showed T2/FLAIR hyperintense foci, predominantly in the periventricular white matter  but also in the juxtacortical and deep white matter of the hemispheres and in the thalamus, pons and spinal cord.  None of the foci appear to be acute.  They do not enhance.  Compared to the MRI dated 06/29/2018, there are no new lesions.     Chronic maxillary and ethmoid sinusitis.  MRI brain 02/19/2022 showed T2/FLAIR hyperintense foci in the periventricular and juxtacortical white matter of the cerebral hemispheres. A few additional foci are noted in the pons, right cerebellar hemisphere and spinal cord. These are consistent with chronic demyelinating plaque associated with multiple sclerosis. None of them appear to be acute. They do not enhance. Compared to the MRI from 12/31/2020, there are no new lesions.   MRI cervical 02/19/22 showed There are T2 hyperintense  foci posteriorly to the left adjacent to C2, posteriorly bilaterally adjacent to C3, anteriorly adjacent to C4-C5, and posteriorly adjacent to C6. All of these foci were present on the previous MRI.Amanda Horne   MRI brain 04/16/2023 T2/FLAIR hyperintense foci in the cerebral hemispheres, right cerebellar hemisphere, spinal cord, right thalamus/internal capsule and pons in a pattern consistent with chronic demyelinating plaque associated with multiple sclerosis. Other foci appear to be acute. They do not enhance. Compared to the MRI from 02/19/2022, there were no new lesions   REVIEW OF SYSTEMS: Constitutional: No fevers, chills, sweats, or change in appetite Eyes: No visual changes, double vision, eye pain Ear, nose and throat: No hearing loss, ear pain, nasal congestion, sore throat Cardiovascular: No chest pain, palpitations Respiratory:  No shortness of breath at rest or with exertion.   No wheezes GastrointestinaI: No nausea, vomiting, diarrhea, abdominal pain, fecal incontinence Genitourinary:  No dysuria, urinary retention or frequency.  No nocturia. Musculoskeletal:  No neck pain, back pain Integumentary: No rash, pruritus, skin lesions Neurological: as above Psychiatric: No depression at this time.  No anxiety Endocrine: No palpitations, diaphoresis, change in appetite, change in weigh or increased thirst Hematologic/Lymphatic:  No anemia, purpura, petechiae. Allergic/Immunologic: No itchy/runny eyes, nasal congestion, recent allergic reactions, rashes  ALLERGIES: No Known Allergies  HOME MEDICATIONS:  Current Outpatient Medications:    baclofen  (LIORESAL ) 20 MG tablet, TAKE 3 TO 4 TABLETS BY MOUTH ONCE DAILY FOR SPASTICITY, Disp: 120 tablet, Rfl: 0   CALCIUM PO, Take 1 Dose by mouth daily., Disp: , Rfl:    losartan  (COZAAR ) 25 MG tablet, Take 25 mg by mouth daily., Disp: , Rfl:    natalizumab (TYSABRI) 300 MG/15ML injection, Inject 300 mg into the vein every 30 (thirty) days., Disp: , Rfl:     phentermine  (ADIPEX-P ) 37.5 MG tablet, Take 1 tablet (37.5 mg total) by mouth daily before breakfast., Disp: 90 tablet, Rfl: 1   UNABLE TO FIND, Vitality Multivitamin and Mineral, Disp: , Rfl:    UNABLE TO FIND, Take 1 Dose by mouth daily. Med Name: Ogranic beet root, Disp: , Rfl:    zonisamide  (ZONEGRAN ) 100 MG capsule, One po qHS, Disp: 90 capsule, Rfl: 3   ALPRAZolam  (XANAX ) 0.5 MG tablet, Take  2-3 tablets 30 min. prior to MRI.  You must not drink alcohol, drive or operate dangerous equipment after taking this medication. (Patient not taking: Reported on 10/16/2023), Disp: 3 tablet, Rfl: 0   cholecalciferol (VITAMIN D ) 1000 units tablet, Take 2,000 Units by mouth daily. (Patient not taking: Reported on 10/16/2023), Disp: , Rfl:    hydrOXYzine (ATARAX/VISTARIL) 25 MG tablet, Take 1 tablet by mouth as needed. (Patient not taking: Reported on 10/16/2023), Disp: , Rfl:  methylPREDNISolone  (MEDROL  DOSEPAK) 4 MG TBPK tablet, Take as directed (Patient not taking: Reported on 10/16/2023), Disp: 21 tablet, Rfl: 0   oxybutynin  (DITROPAN  XL) 10 MG 24 hr tablet, Take 1 tablet (10 mg total) by mouth at bedtime. (Patient not taking: Reported on 10/16/2023), Disp: 90 tablet, Rfl: 3  PAST MEDICAL HISTORY: Past Medical History:  Diagnosis Date   Abnormal Pap smear    Herpes    Hx of varicella    Hypertension    Multiple sclerosis (HCC)    Vision abnormalities     PAST SURGICAL HISTORY: Past Surgical History:  Procedure Laterality Date   COLPOSCOPY     WISDOM TOOTH EXTRACTION      FAMILY HISTORY: Family History  Problem Relation Age of Onset   Hypertension Mother    Diabetes Mother    Diabetes Sister    Other Paternal Uncle        Wiskott Aldnch syndrome, died as a child   Healthy Father     SOCIAL HISTORY:  Social History   Socioeconomic History   Marital status: Single    Spouse name: Not on file   Number of children: Not on file   Years of education: Not on file   Highest  education level: Not on file  Occupational History   Not on file  Tobacco Use   Smoking status: Never   Smokeless tobacco: Never  Vaping Use   Vaping status: Never Used  Substance and Sexual Activity   Alcohol use: No   Drug use: No   Sexual activity: Not on file  Other Topics Concern   Not on file  Social History Narrative   Not on file   Social Drivers of Health   Financial Resource Strain: Not on file  Food Insecurity: Not on file  Transportation Needs: Not on file  Physical Activity: Not on file  Stress: Not on file  Social Connections: Not on file  Intimate Partner Violence: Not on file     PHYSICAL EXAM  Vitals:   10/16/23 0835  BP: (!) 143/95  Pulse: 87  Weight: 174 lb (78.9 kg)  Height: 5\' 4"  (1.626 m)     Body mass index is 29.87 kg/m.   General: The patient is well-developed and well-nourished and in no acute distress.  Neck has good range of motion. Neurologic Exam  Mental status: The patient is alert and oriented x 3 at the time of the examination. The patient has apparent normal recent and remote memory, with an apparently normal attention span and concentration ability.   Speech is normal.  Cranial nerves: Extraocular movements are full.  Facial strength is normal.  Trapezius strength is normal.. No obvious hearing deficits are noted.  Motor:  Muscle bulk is normal.  Muscle tone was normal in the arms and legs today.  Strength is 5/5  Sensory: She has normal sensation to touch and vibration in the arms and legs.  Coordination: She has good finger-nose-finger and heel-to-shin  Gait and station: Station is normal.  Gait is normal.  Tandem gait is near normal.  Romberg is negative  Reflexes: Deep tendon reflexes are increased in the legs with spread at the knees.   There is no clonus.      DIAGNOSTIC DATA (LABS, IMAGING, TESTING) - I reviewed patient Horne, labs, notes, testing and imaging myself where available.  Lab Results  Component  Value Date   WBC 11.1 (H) 06/05/2023   HGB 9.0 (L) 06/05/2023   HCT  31.1 (L) 06/05/2023   MCV 63 (L) 06/05/2023   PLT 366 06/05/2023      Component Value Date/Time   NA 141 12/21/2019 1344   K 3.8 12/21/2019 1344   CL 106 12/21/2019 1344   CO2 22 12/21/2019 1344   GLUCOSE 87 12/21/2019 1344   GLUCOSE 109 (H) 08/24/2016 0523   BUN 14 12/21/2019 1344   CREATININE 0.74 12/21/2019 1344   CALCIUM 9.3 12/21/2019 1344   PROT 6.8 12/21/2019 1344   ALBUMIN 4.3 12/21/2019 1344   AST 26 12/21/2019 1344   ALT 32 12/21/2019 1344   ALKPHOS 89 12/21/2019 1344   BILITOT 0.3 12/21/2019 1344   GFRNONAA 105 12/21/2019 1344   GFRAA 121 12/21/2019 1344    Lab Results  Component Value Date   VITAMINB12 1,718 (H) 08/21/2016        ASSESSMENT AND PLAN  1. Relapsing remitting multiple sclerosis (HCC)   2. Numbness   3. High risk medication use       1.  Continue Tysabri 300 g every 4 weeks.  JCV antibody test is negative and will be checked every 6 months or so. Last MRi looks unchanged 2.   Stay active and exercise as tolerated. 3.   Continue zonisamide  for migraine.  Continue baclofen  qHSfor spasticity.    4.   Prn  Oxybutynin  for bladder. 5.   Contine phentermine  as stimulant for MS fatigu and weight.    Will change to tablet ad she can try 1/2 dose to see if BP improves RTC 6 months or sooner if there are new or worsening neurologic symptoms   Merrit Waugh A. Godwin Lat, MD, PhD, FAAN Certified in Neurology, Clinical Neurophysiology, Sleep Medicine, Pain Medicine and Neuroimaging Director, [facility]  [facility] [address] [place], [place] [address] [phone]

## 2023-10-21 ENCOUNTER — Telehealth: Payer: Self-pay | Admitting: *Deleted

## 2023-10-21 NOTE — Telephone Encounter (Signed)
 Faxed back response below to [facility] at [phone]. Received fax confirmation.

## 2023-11-09 ENCOUNTER — Other Ambulatory Visit: Payer: Self-pay | Admitting: Neurology

## 2023-11-10 ENCOUNTER — Other Ambulatory Visit: Payer: Self-pay

## 2023-12-08 ENCOUNTER — Other Ambulatory Visit (INDEPENDENT_AMBULATORY_CARE_PROVIDER_SITE_OTHER): Payer: Self-pay

## 2023-12-08 ENCOUNTER — Other Ambulatory Visit: Payer: Self-pay

## 2023-12-08 ENCOUNTER — Telehealth: Payer: Self-pay

## 2023-12-08 DIAGNOSIS — G35 Multiple sclerosis: Secondary | ICD-10-CM

## 2023-12-08 DIAGNOSIS — Z0289 Encounter for other administrative examinations: Secondary | ICD-10-CM

## 2023-12-08 NOTE — Telephone Encounter (Signed)
 Placed JCV in [facility] box 12/08/2023

## 2023-12-09 LAB — CBC WITH DIFFERENTIAL/PLATELET
Basophils Absolute: 0 x10E3/uL (ref 0.0–0.2)
Basos: 0 %
EOS (ABSOLUTE): 0.3 x10E3/uL (ref 0.0–0.4)
Eos: 3 %
Hematocrit: 33.4 % — ABNORMAL LOW (ref 34.0–46.6)
Hemoglobin: 9.8 g/dL — ABNORMAL LOW (ref 11.1–15.9)
Immature Grans (Abs): 0 x10E3/uL (ref 0.0–0.1)
Immature Granulocytes: 0 %
Lymphocytes Absolute: 4.5 x10E3/uL — ABNORMAL HIGH (ref 0.7–3.1)
Lymphs: 39 %
MCH: 20 pg — ABNORMAL LOW (ref 26.6–33.0)
MCHC: 29.3 g/dL — ABNORMAL LOW (ref 31.5–35.7)
MCV: 68 fL — ABNORMAL LOW (ref 79–97)
Monocytes Absolute: 0.8 x10E3/uL (ref 0.1–0.9)
Monocytes: 7 %
Neutrophils Absolute: 5.8 x10E3/uL (ref 1.4–7.0)
Neutrophils: 51 %
Platelets: 264 x10E3/uL (ref 150–450)
RBC: 4.89 x10E6/uL (ref 3.77–5.28)
RDW: 21 % — ABNORMAL HIGH (ref 11.7–15.4)
WBC: 11.4 x10E3/uL — ABNORMAL HIGH (ref 3.4–10.8)

## 2023-12-15 NOTE — Telephone Encounter (Signed)
 SABRA

## 2023-12-31 ENCOUNTER — Telehealth: Payer: Self-pay | Admitting: Neurology

## 2023-12-31 NOTE — Telephone Encounter (Signed)
 Lua.  Her JCV antibody converted from negative to low positive (0.68).  Therefore, I would recommend 1 of 2 options: 1.  Continue Tysabri but change to every 6 weeks and test the JCV antibody every 3 months or so.  If she converts to moderate or high positive will recommend to switch 2.  We could also switch to another medication at this time.   For now she would like to stay on Tysabri every 6 weeks.  I will let the infusion service know.  We will discuss this further at the next visit.

## 2024-01-05 NOTE — Telephone Encounter (Signed)
 Gave updated order below to [facility].

## 2024-02-20 ENCOUNTER — Other Ambulatory Visit: Payer: Self-pay | Admitting: Neurology

## 2024-02-23 NOTE — Telephone Encounter (Signed)
 Last seen on 10/16/23 Follow up scheduled on 05/03/24

## 2024-03-01 ENCOUNTER — Telehealth: Payer: Self-pay | Admitting: *Deleted

## 2024-03-01 DIAGNOSIS — Z79899 Other long term (current) drug therapy: Secondary | ICD-10-CM

## 2024-03-01 DIAGNOSIS — G35A Relapsing-remitting multiple sclerosis: Secondary | ICD-10-CM

## 2024-03-01 NOTE — Telephone Encounter (Signed)
  Infusion scheduled for 03/02/24, lab orders placed

## 2024-03-02 ENCOUNTER — Other Ambulatory Visit

## 2024-03-03 NOTE — Telephone Encounter (Signed)
 Silvano said pt no showed for infusion on 03/02/24.

## 2024-03-09 ENCOUNTER — Other Ambulatory Visit

## 2024-03-09 ENCOUNTER — Other Ambulatory Visit: Payer: Self-pay

## 2024-03-09 DIAGNOSIS — G35A Relapsing-remitting multiple sclerosis: Secondary | ICD-10-CM

## 2024-03-09 DIAGNOSIS — Z79899 Other long term (current) drug therapy: Secondary | ICD-10-CM

## 2024-03-09 NOTE — Telephone Encounter (Signed)
.  Placed JCV lab in [facility] lock box for routine lab pick up. Results pending.     Pt came for infusion on 03/09/24   .SABRA

## 2024-03-10 ENCOUNTER — Ambulatory Visit: Payer: Self-pay | Admitting: Neurology

## 2024-03-10 LAB — CBC WITH DIFFERENTIAL/PLATELET
Basophils Absolute: 0.1 x10E3/uL (ref 0.0–0.2)
Basos: 0 %
EOS (ABSOLUTE): 0.3 x10E3/uL (ref 0.0–0.4)
Eos: 2 %
Hematocrit: 31.9 % — ABNORMAL LOW (ref 34.0–46.6)
Hemoglobin: 9.2 g/dL — ABNORMAL LOW (ref 11.1–15.9)
Immature Grans (Abs): 0 x10E3/uL (ref 0.0–0.1)
Immature Granulocytes: 0 %
Lymphocytes Absolute: 4.2 x10E3/uL — ABNORMAL HIGH (ref 0.7–3.1)
Lymphs: 33 %
MCH: 19.2 pg — ABNORMAL LOW (ref 26.6–33.0)
MCHC: 28.8 g/dL — ABNORMAL LOW (ref 31.5–35.7)
MCV: 67 fL — ABNORMAL LOW (ref 79–97)
Monocytes Absolute: 1.1 x10E3/uL — ABNORMAL HIGH (ref 0.1–0.9)
Monocytes: 9 %
NRBC: 1 % — ABNORMAL HIGH (ref 0–0)
Neutrophils Absolute: 7.1 x10E3/uL — ABNORMAL HIGH (ref 1.4–7.0)
Neutrophils: 56 %
Platelets: 404 x10E3/uL (ref 150–450)
RBC: 4.8 x10E6/uL (ref 3.77–5.28)
RDW: 16.7 % — ABNORMAL HIGH (ref 11.7–15.4)
WBC: 12.7 x10E3/uL — ABNORMAL HIGH (ref 3.4–10.8)

## 2024-03-11 NOTE — Telephone Encounter (Signed)
 Faxed to [facility] received fax confirmation.

## 2024-03-15 NOTE — Telephone Encounter (Signed)
 Faxed form again, received fax confirmation.

## 2024-03-16 NOTE — Telephone Encounter (Signed)
 SABRA

## 2024-04-13 ENCOUNTER — Encounter: Payer: Self-pay | Admitting: Neurology

## 2024-04-19 ENCOUNTER — Other Ambulatory Visit: Payer: Self-pay | Admitting: Neurology

## 2024-04-21 ENCOUNTER — Other Ambulatory Visit: Payer: Self-pay

## 2024-04-28 ENCOUNTER — Other Ambulatory Visit: Payer: Self-pay

## 2024-04-28 ENCOUNTER — Encounter (HOSPITAL_BASED_OUTPATIENT_CLINIC_OR_DEPARTMENT_OTHER): Payer: Self-pay | Admitting: Emergency Medicine

## 2024-04-28 ENCOUNTER — Emergency Department (HOSPITAL_BASED_OUTPATIENT_CLINIC_OR_DEPARTMENT_OTHER)
Admission: EM | Admit: 2024-04-28 | Discharge: 2024-04-28 | Disposition: A | Discharge status: Home / Self Care | Attending: Emergency Medicine | Admitting: Emergency Medicine

## 2024-04-28 DIAGNOSIS — M545 Low back pain, unspecified: Secondary | ICD-10-CM | POA: Diagnosis present

## 2024-04-28 DIAGNOSIS — Y92410 Unspecified street and highway as the place of occurrence of the external cause: Secondary | ICD-10-CM | POA: Insufficient documentation

## 2024-04-28 MED ORDER — KETOROLAC TROMETHAMINE 15 MG/ML IJ SOLN
15.0000 mg | Freq: Once | INTRAMUSCULAR | Status: AC
  Administered 2024-04-28: 15 mg via INTRAMUSCULAR
  Filled 2024-04-28: qty 1
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
## 2024-04-28 NOTE — ED Notes (Signed)

## 2024-04-28 NOTE — Discharge Instructions (Signed)
Your back pain is most likely due to a muscular strain.  There is been a lot of research on back pain, unfortunately the only thing that seems to really help is Tylenol and ibuprofen.  Relative rest is also important to not lift greater than 10 pounds bending or twisting at the waist.  Please follow-up with your family physician.  The other thing that really seems to benefit patients is physical therapy which your doctor may send you for.  Please return to the emergency department for new numbness or weakness to your arms or legs. Difficulty with urinating or urinating or pooping on yourself.  Also if you cannot feel toilet paper when you wipe or get a fever.   Take 4 over the counter ibuprofen tablets 3 times a day or 2 over-the-counter naproxen tablets twice a day for pain. Also take tylenol 1000mg (2 extra strength) four times a day.   Stretches can help try [email or web address]
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
## 2024-05-03 ENCOUNTER — Ambulatory Visit: Admitting: Neurology

## 2024-05-03 ENCOUNTER — Encounter: Payer: Self-pay | Admitting: Neurology

## 2024-05-03 VITALS — BP 132/74 | HR 75 | Ht 64.0 in | Wt 163.5 lb

## 2024-05-03 DIAGNOSIS — R5383 Other fatigue: Secondary | ICD-10-CM | POA: Diagnosis not present

## 2024-05-03 DIAGNOSIS — G35A Relapsing-remitting multiple sclerosis: Secondary | ICD-10-CM

## 2024-05-03 DIAGNOSIS — R200 Anesthesia of skin: Secondary | ICD-10-CM

## 2024-05-03 DIAGNOSIS — G43009 Migraine without aura, not intractable, without status migrainosus: Secondary | ICD-10-CM

## 2024-05-03 DIAGNOSIS — Z79899 Other long term (current) drug therapy: Secondary | ICD-10-CM

## 2024-05-03 DIAGNOSIS — E559 Vitamin D deficiency, unspecified: Secondary | ICD-10-CM | POA: Diagnosis not present

## 2024-05-03 MED ORDER — METHYLPREDNISOLONE 4 MG PO TBPK: ORAL_TABLET | ORAL | 0 refills | Status: AC

## 2024-05-03 MED ORDER — PHENTERMINE HCL 37.5 MG PO TABS: 37.5000 mg | ORAL_TABLET | Freq: Every day | ORAL | 1 refills | Status: AC

## 2024-05-03 MED ORDER — ZONISAMIDE 100 MG PO CAPS: ORAL_CAPSULE | ORAL | 4 refills | Status: AC
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
## 2024-06-02 ENCOUNTER — Other Ambulatory Visit: Payer: Self-pay

## 2024-06-02 DIAGNOSIS — E559 Vitamin D deficiency, unspecified: Secondary | ICD-10-CM

## 2024-06-02 DIAGNOSIS — G43009 Migraine without aura, not intractable, without status migrainosus: Secondary | ICD-10-CM

## 2024-06-02 DIAGNOSIS — Z79899 Other long term (current) drug therapy: Secondary | ICD-10-CM

## 2024-06-02 DIAGNOSIS — R200 Anesthesia of skin: Secondary | ICD-10-CM

## 2024-06-02 DIAGNOSIS — R5383 Other fatigue: Secondary | ICD-10-CM

## 2024-06-02 DIAGNOSIS — G35A Relapsing-remitting multiple sclerosis: Secondary | ICD-10-CM

## 2024-06-10 LAB — CBC WITH DIFFERENTIAL/PLATELET
Basophils Absolute: 0.1 x10E3/uL (ref 0.0–0.2)
Basos: 1 %
EOS (ABSOLUTE): 0.2 x10E3/uL (ref 0.0–0.4)
Eos: 2 %
Hematocrit: 32.5 % — ABNORMAL LOW (ref 34.0–46.6)
Hemoglobin: 9.6 g/dL — ABNORMAL LOW (ref 11.1–15.9)
Immature Grans (Abs): 0 x10E3/uL (ref 0.0–0.1)
Immature Granulocytes: 0 %
Lymphocytes Absolute: 4.5 x10E3/uL — ABNORMAL HIGH (ref 0.7–3.1)
Lymphs: 42 %
MCH: 19.8 pg — ABNORMAL LOW (ref 26.6–33.0)
MCHC: 29.5 g/dL — ABNORMAL LOW (ref 31.5–35.7)
MCV: 67 fL — ABNORMAL LOW (ref 79–97)
Monocytes Absolute: 0.9 x10E3/uL (ref 0.1–0.9)
Monocytes: 8 %
Neutrophils Absolute: 5 x10E3/uL (ref 1.4–7.0)
Neutrophils: 47 %
Platelets: 362 x10E3/uL (ref 150–450)
RBC: 4.84 x10E6/uL (ref 3.77–5.28)
RDW: 23.8 % — ABNORMAL HIGH (ref 11.7–15.4)
WBC: 10.7 x10E3/uL (ref 3.4–10.8)

## 2024-06-10 LAB — STRATIFY JCV(TM) AB W/INDEX
JCV Antibody: POSITIVE — AB
JCV Index Value: 0.69

## 2024-06-14 ENCOUNTER — Ambulatory Visit: Payer: Self-pay | Admitting: Neurology

## 2024-12-23 ENCOUNTER — Ambulatory Visit: Admitting: Neurology
